# Patient Record
Sex: Male | Born: 1950
Health system: Southern US, Community
[De-identification: ages and names within clinical notes are randomized; demographics above are authoritative.]

## PROBLEM LIST (undated history)

## (undated) DIAGNOSIS — I10 Essential (primary) hypertension: Secondary | ICD-10-CM

## (undated) DIAGNOSIS — M1990 Unspecified osteoarthritis, unspecified site: Secondary | ICD-10-CM

## (undated) DIAGNOSIS — E119 Type 2 diabetes mellitus without complications: Secondary | ICD-10-CM

## (undated) DIAGNOSIS — N186 End stage renal disease: Secondary | ICD-10-CM

## (undated) HISTORY — PX: OTHER SURGICAL HISTORY: SHX169

---

## 2006-02-22 ENCOUNTER — Emergency Department (HOSPITAL_COMMUNITY): Admission: EM | Admit: 2006-02-22 | Discharge: 2006-02-22 | Payer: Self-pay | Admitting: Emergency Medicine

## 2007-02-06 ENCOUNTER — Observation Stay (HOSPITAL_COMMUNITY): Admission: EM | Admit: 2007-02-06 | Discharge: 2007-02-07 | Payer: Self-pay | Admitting: Family Medicine

## 2007-02-06 ENCOUNTER — Ambulatory Visit: Payer: Self-pay | Admitting: Thoracic Surgery (Cardiothoracic Vascular Surgery)

## 2007-02-15 ENCOUNTER — Ambulatory Visit: Payer: Self-pay | Admitting: Thoracic Surgery (Cardiothoracic Vascular Surgery)

## 2007-02-15 ENCOUNTER — Encounter
Admission: RE | Admit: 2007-02-15 | Discharge: 2007-02-15 | Payer: Self-pay | Admitting: Thoracic Surgery (Cardiothoracic Vascular Surgery)

## 2008-10-29 ENCOUNTER — Emergency Department (HOSPITAL_COMMUNITY): Admission: EM | Admit: 2008-10-29 | Discharge: 2008-10-29 | Payer: Self-pay | Admitting: Family Medicine

## 2010-06-11 NOTE — Assessment & Plan Note (Signed)
OFFICE VISIT   STERLYN, Matthew [name]  DOB:  1950-10-29                                        February 15, 2007  CHART #:  [id number]   HISTORY OF PRESENT ILLNESS:  [name] returns for a follow-up  related to atypical chest pain and possible large bleb in the left lung  originally discovered on 02/06/07 at the time he was evaluated in the  Emergency Department at [facility].  At that time the  ER physicians and radiologists were concerned that he had spontaneous  pneumothorax.  On careful examination of the chest x-ray obtained, there  was no clear evidence for pneumothorax and as such, Matthew [name] was  simply observed in the hospital overnight.  Repeat chest x-ray the  following morning with decubitus films reveal no sign of pneumothorax  but rather a very large bleb in the left lung field.  Matthew [name]  returns to the office today with another repeat x-ray and with plans for  follow-up.  He states that the atypical left sided chest pain that he  had resolved.  He also had been having some trouble tolerating oral  foods that day and he wonders whether or not he may have had some type  of virus.  All of these symptoms have resolved.  He reports no shortness  of breath.  He has no cough.  He has no further chest pain.   PHYSICAL EXAMINATION:  Notable for a well appearing male with blood  pressure 102/68, pulse 89, oxygen saturation 92% on room air.  Examination of chest reveals clear breath sounds which are symmetrical  bilaterally.  No wheezes or rhonchi are demonstrated.  Cardiovascular:  Notable for regular rate and rhythm.  Extremities:  Warm and well  perfused.  The remainder of his physical exam is unrevealing.   DIAGNOSTIC TESTS:  Chest x-ray obtained today is reviewed and compared  with his previous x-rays.  This demonstrates stable appearance of the  left lung field with large bleb at the left apex.  There is no sign of  pneumothorax.  There is no sign of pneumonia.  There is no significant  of any significant pulmonary parenchymal lesions.   IMPRESSION:  Probable large left sided intraparenchymal bleb with recent  transient episode of atypical chest pain that has resolved.   PLAN:  Matthew [name] will call and return to see [place] in the future should  he develop any further episodes of recurrent pain.  If so, a chest CT  scan might be warranted for further evaluation to make sure this bleb  does not get infected or has not become associated with other pulmonary  parenchymal lesions.  At present he seems to be doing fine.   Valentina Gu. Roxy Manns, M.D.  Electronically Signed   CHO/MEDQ  D:  02/15/2007  T:  02/15/2007  Job:  [id number]

## 2010-10-17 LAB — BASIC METABOLIC PANEL
Creatinine, Ser: 1.19
GFR calc Af Amer: >60
GFR calc non Af Amer: >60
Glucose, Bld: 94
Potassium: 4.2
Sodium: 137

## 2010-10-17 LAB — BASIC METABOLIC PANEL WITH GFR
BUN: 9
CO2: 28
Calcium: 9
Chloride: 102

## 2010-10-17 LAB — DIFFERENTIAL
Basophils Absolute: 0.1
Basophils Relative: 1
Eosinophils Absolute: 0.1
Eosinophils Relative: 2
Lymphocytes Relative: 29
Lymphs Abs: 1.6
Monocytes Absolute: 0.5
Monocytes Relative: 9
Neutro Abs: 3.2
Neutrophils Relative %: 58

## 2010-10-17 LAB — RAPID STREP SCREEN (MED CTR MEBANE ONLY): Streptococcus, Group A Screen (Direct): NEGATIVE

## 2010-10-17 LAB — CBC
HCT: 50.8
Hemoglobin: 16.9
MCHC: 33.4
MCV: 90.8
Platelets: 190
RBC: 5.59
RDW: 13.4
WBC: 5.5

## 2010-10-17 LAB — PROTIME-INR
INR: 0.8
Prothrombin Time: 11.4 — ABNORMAL LOW

## 2010-10-17 LAB — APTT: aPTT: 27

## 2017-03-12 ENCOUNTER — Ambulatory Visit: Payer: Self-pay | Admitting: Family Medicine

## 2017-03-20 ENCOUNTER — Other Ambulatory Visit: Payer: Self-pay

## 2017-03-20 ENCOUNTER — Ambulatory Visit (INDEPENDENT_AMBULATORY_CARE_PROVIDER_SITE_OTHER): Payer: Medicare Other | Admitting: Family Medicine

## 2017-03-20 ENCOUNTER — Encounter: Payer: Self-pay | Admitting: Family Medicine

## 2017-03-20 ENCOUNTER — Ambulatory Visit (INDEPENDENT_AMBULATORY_CARE_PROVIDER_SITE_OTHER): Payer: Medicare Other

## 2017-03-20 VITALS — BP 138/70 | HR 81 | Temp 97.3°F | Resp 17 | Ht 71.5 in | Wt 197.0 lb

## 2017-03-20 DIAGNOSIS — R350 Frequency of micturition: Secondary | ICD-10-CM

## 2017-03-20 DIAGNOSIS — Z23 Encounter for immunization: Secondary | ICD-10-CM

## 2017-03-20 DIAGNOSIS — R05 Cough: Secondary | ICD-10-CM

## 2017-03-20 DIAGNOSIS — R631 Polydipsia: Secondary | ICD-10-CM | POA: Diagnosis not present

## 2017-03-20 DIAGNOSIS — N400 Enlarged prostate without lower urinary tract symptoms: Secondary | ICD-10-CM

## 2017-03-20 DIAGNOSIS — N402 Nodular prostate without lower urinary tract symptoms: Secondary | ICD-10-CM

## 2017-03-20 DIAGNOSIS — R059 Cough, unspecified: Secondary | ICD-10-CM

## 2017-03-20 DIAGNOSIS — R0609 Other forms of dyspnea: Secondary | ICD-10-CM

## 2017-03-20 DIAGNOSIS — R0602 Shortness of breath: Secondary | ICD-10-CM | POA: Diagnosis not present

## 2017-03-20 LAB — POC MICROSCOPIC URINALYSIS (UMFC): Mucus: ABSENT

## 2017-03-20 LAB — POCT URINALYSIS DIP (MANUAL ENTRY)
Bilirubin, UA: NEGATIVE
Blood, UA: NEGATIVE
Glucose, UA: NEGATIVE mg/dL
Ketones, POC UA: NEGATIVE mg/dL
LEUKOCYTES UA: NEGATIVE
NITRITE UA: NEGATIVE
PH UA: 5.5 (ref 5.0–8.0)
PROTEIN UA: NEGATIVE mg/dL
Spec Grav, UA: <=1.005 — AB (ref 1.010–1.025)
UROBILINOGEN UA: 0.2 U/dL

## 2017-03-20 LAB — GLUCOSE, POCT (MANUAL RESULT ENTRY): POC GLUCOSE: 81 mg/dL (ref 70–99)

## 2017-03-20 NOTE — Patient Instructions (Addendum)
Blood sugar was normal today. I will check for prostate infection with blood tests, but I do think there may be some enlargement or possible nodular areas of your prostate. I will refer you to urology for further evaluation. I'm also checking other electrolytes for your frequent urination. Please follow-up in 1 week to discuss these results  I will follow the x-ray results and let you know if there are any concerns. We can also discuss the shortness of breath with exertion further at next visit. Please follow-up in 1 week.  Return to the clinic or go to the nearest emergency room if any of your symptoms worsen or new symptoms occur.     IF you received an x-ray today, you will receive an invoice from [facility]. Please contact [facility] at [phone] with questions or concerns regarding your invoice.   IF you received labwork today, you will receive an invoice from [place] [facility]. Please contact [facility] at [phone] with questions or concerns regarding your invoice.   Our billing staff will not be able to assist you with questions regarding bills from these companies.  You will be contacted with the lab results as soon as they are available. The fastest way to get your results is to activate your My Chart account. Instructions are located on the last page of this paperwork. If you have not heard from [place] regarding the results in 2 weeks, please contact this office.

## 2017-03-20 NOTE — Progress Notes (Signed)
Subjective:  By signing my name below, I, Essence Howell, attest that this documentation has been prepared under the direction and in the presence of Wendie Agreste, MD Electronically Signed: Ladene Artist, ED Scribe 03/20/2017 at 9:37 AM.   Patient ID: Matthew [name], male    DOB: [birth year], 67 y.o.   MRN: [id number]   Chief Complaint  Patient presents with  . New Patient (Initial Visit)    establish care.  Pt has concerns about urinary frequency x 6 months. ? prostate Problem and sob after exercise.   HPI Matthew [name] is a 67 y.o. male who presents to Primary Care at [facility] to establish care. New pt to me. Complaining of urinary frequency and dyspnea.  Urinary Frequency  Pt reports urinary frequency and nocturia x 6 months. Reports he urinates approximately every 15 mins. Also reports polydipsia and has increased his water intake. Denies dysuria, nausea, vomiting, abdominal pain, fever, blurry vision. Pt's daughter with DM and several other family members. No h/o prostate issues.  Dyspnea Pt reports intermittent sob after exercising, specially walking up stairs/jogging/riding his bike. Also reports some occasional dry cough x 1 yr. Denies sob today, cp, chest tightness, unexplained weight loss, night sweats. Pt is a nonsmoker.  There are no active problems to display for this patient.    Not on File Prior to Admission medications   Not on File   Social History   Socioeconomic History  . Marital status: Single    Spouse name: Not on file  . Number of children: Not on file  . Years of education: Not on file  . Highest education level: Not on file  Social Needs  . Financial resource strain: Not on file  . Food insecurity - worry: Not on file  . Food insecurity - inability: Not on file  . Transportation needs - medical: Not on file  . Transportation needs - non-medical: Not on file  Occupational History  . Not on file  Tobacco Use  . Smoking status: Never Smoker    . Smokeless tobacco: Never Used  Substance and Sexual Activity  . Alcohol use: Yes    Alcohol/week: 6.0 - 7.2 oz    Types: 10 - 12 Cans of beer per week  . Drug use: No  . Sexual activity: Yes  Other Topics Concern  . Not on file  Social History Narrative  . Not on file   Review of Systems  Constitutional: Negative for diaphoresis, fever and unexpected weight change.  Eyes: Negative for visual disturbance.  Respiratory: Positive for cough and shortness of breath. Negative for chest tightness.   Cardiovascular: Negative for chest pain.  Gastrointestinal: Negative for abdominal pain, nausea and vomiting.  Genitourinary: Positive for frequency. Negative for dysuria.      Objective:   Physical Exam  Constitutional: He is oriented to person, place, and time. He appears well-developed and well-nourished.  HENT:  Head: Normocephalic and atraumatic.  Eyes: EOM are normal. Pupils are equal, round, and reactive to light.  Neck: No JVD present. Carotid bruit is not present.  Cardiovascular: Normal rate, regular rhythm and normal heart sounds.  No murmur heard. Pulmonary/Chest: Effort normal and breath sounds normal. He has no rales.  Abdominal: Soft. There is no tenderness.  Genitourinary: Prostate is enlarged.  Genitourinary Comments: Prostate was enlarged. Asymmetric with possible nodular/firm area 5-6 o'clock.  Musculoskeletal: He exhibits no edema.  Neurological: He is alert and oriented to person, place, and time.  Skin: Skin is warm  and dry.  Psychiatric: He has a normal mood and affect.  Vitals reviewed.  Vitals:   03/20/17 0850  BP: 138/70  Pulse: 81  Resp: 17  Temp: (!) 97.3 F (36.3 C)  TempSrc: Oral  Weight: 197 lb (89.4 kg)  Height: 5' 11.5" (1.816 m)   Results for orders placed or performed in visit on 03/20/17  POCT urinalysis dipstick  Result Value Ref Range   Color, UA yellow yellow   Clarity, UA clear clear   Glucose, UA negative negative mg/dL    Bilirubin, UA negative negative   Ketones, POC UA negative negative mg/dL   Spec Grav, UA <=1.005 (A) 1.010 - 1.025   Blood, UA negative negative   pH, UA 5.5 5.0 - 8.0   Protein Ur, POC negative negative mg/dL   Urobilinogen, UA 0.2 0.2 or 1.0 E.U./dL   Nitrite, UA Negative Negative   Leukocytes, UA Negative Negative  POCT glucose (manual entry)  Result Value Ref Range   POC Glucose 81 70 - 99 mg/dl  POCT Microscopic Urinalysis ([facility])  Result Value Ref Range   WBC,UR,HPF,POC None None WBC/hpf   RBC,UR,HPF,POC None None RBC/hpf   Bacteria None None, Too numerous to count   Mucus Absent Absent   Epithelial Cells, UR Per Microscopy None None, Too numerous to count cells/hpf  EKG readings done by Wendie Agreste, MD: sinus rhythm. Flat T waves inferolateral. No acute findings.  Dg Chest 2 View  Result Date: 03/20/2017 CLINICAL DATA:  Cough.  Shortness of breath. EXAM: CHEST  2 VIEW COMPARISON:  10/29/2008. FINDINGS: Mediastinum hilar structures normal. Prominent bullous changes, particularly left upper lung consistent with COPD. Similar findings noted on prior exam. Stable cardiomegaly. No pulmonary venous congestion. No acute bony abnormality IMPRESSION: Bullous COPD. Bullous changes particular severe about the left upper lung. No acute cardiopulmonary disease. Chest is stable from 10/29/2008. Electronically Signed   By: Marcello Moores  Register   On: 03/20/2017 10:30      Assessment & Plan:  Matthew [name] is a 67 y.o. male Urinary frequency - Plan: POCT urinalysis dipstick, PSA, POCT glucose (manual entry), POCT Microscopic Urinalysis ([facility]), Comprehensive metabolic panel, Ambulatory referral to Urology Increased thirst - Plan: POCT glucose (manual entry) Enlarged prostate - Plan: Ambulatory referral to Urology Nodular prostate - Plan: Ambulatory referral to Urology  -Increase thirst with urinary frequency, initial thought of possible hyperglycemia, but reassuring testing in office.  Prostate asymmetric with possible nodular area mid to right lobe.  -Check PSA, referral to urology for further evaluation  -Check CMP given dilute urine and above symptoms, differential includes diabetes insipidus  Need for prophylactic vaccination and inoculation against influenza - Plan: Flu Vaccine QUAD 36+ mos IM  Dyspnea on exertion - Plan: CBC, DG Chest 2 View, EKG 12-Lead, Comprehensive metabolic panel Cough - Plan: DG Chest 2 View, EKG 12-Lead  -Reassuring chest x-ray, possible COPD component. EKG reassuring, doubt cardiac source at this point.. May be related to above urinary frequency and increase thirst. CMP pending. Will discuss further workup at follow-up in one week.   No orders of the defined types were placed in this encounter.  Patient Instructions   Blood sugar was normal today. I will check for prostate infection with blood tests, but I do think there may be some enlargement or possible nodular areas of your prostate. I will refer you to urology for further evaluation. I'm also checking other electrolytes for your frequent urination. Please follow-up in 1 week to discuss these  results  I will follow the x-ray results and let you know if there are any concerns. We can also discuss the shortness of breath with exertion further at next visit. Please follow-up in 1 week.  Return to the clinic or go to the nearest emergency room if any of your symptoms worsen or new symptoms occur.     IF you received an x-ray today, you will receive an invoice from [facility]. Please contact [facility] at [phone] with questions or concerns regarding your invoice.   IF you received labwork today, you will receive an invoice from [place]. Please contact [facility] at [phone] with questions or concerns regarding your invoice.   Our billing staff will not be able to assist you with questions regarding bills from these companies.  You will be contacted with the lab  results as soon as they are available. The fastest way to get your results is to activate your My Chart account. Instructions are located on the last page of this paperwork. If you have not heard from [place] regarding the results in 2 weeks, please contact this office.      I personally performed the services described in this documentation, which was scribed in my presence. The recorded information has been reviewed and considered for accuracy and completeness, addended by me as needed, and agree with information above.  Signed,   Merri Ray, MD Primary Care at [place].  03/21/17 2:51 PM

## 2017-03-21 ENCOUNTER — Encounter: Payer: Self-pay | Admitting: Family Medicine

## 2017-03-21 LAB — COMPREHENSIVE METABOLIC PANEL
ALK PHOS: 74 IU/L (ref 39–117)
ALT: 8 IU/L (ref 0–44)
AST: 14 IU/L (ref 0–40)
Albumin/Globulin Ratio: 1.1 — ABNORMAL LOW (ref 1.2–2.2)
Albumin: 4.3 g/dL (ref 3.6–4.8)
BUN/Creatinine Ratio: 15 (ref 10–24)
BUN: 44 mg/dL — ABNORMAL HIGH (ref 8–27)
Bilirubin Total: 0.3 mg/dL (ref 0.0–1.2)
CALCIUM: 9 mg/dL (ref 8.6–10.2)
CO2: 19 mmol/L — AB (ref 20–29)
CREATININE: 3 mg/dL — AB (ref 0.76–1.27)
Chloride: 105 mmol/L (ref 96–106)
GFR calc Af Amer: 24 mL/min/{1.73_m2} — ABNORMAL LOW (ref >59)
GFR, EST NON AFRICAN AMERICAN: 21 mL/min/{1.73_m2} — AB (ref >59)
GLUCOSE: 97 mg/dL (ref 65–99)
Globulin, Total: 3.8 g/dL (ref 1.5–4.5)
Potassium: 4.7 mmol/L (ref 3.5–5.2)
Sodium: 138 mmol/L (ref 134–144)
Total Protein: 8.1 g/dL (ref 6.0–8.5)

## 2017-03-21 LAB — CBC
HEMOGLOBIN: 11.9 g/dL — AB (ref 13.0–17.7)
Hematocrit: 35.7 % — ABNORMAL LOW (ref 37.5–51.0)
MCH: 29.8 pg (ref 26.6–33.0)
MCHC: 33.3 g/dL (ref 31.5–35.7)
MCV: 89 fL (ref 79–97)
PLATELETS: 250 10*3/uL (ref 150–379)
RBC: 4 x10E6/uL — ABNORMAL LOW (ref 4.14–5.80)
RDW: 13.5 % (ref 12.3–15.4)
WBC: 4.9 10*3/uL (ref 3.4–10.8)

## 2017-03-21 LAB — PSA: PROSTATE SPECIFIC AG, SERUM: 7.9 ng/mL — AB (ref 0.0–4.0)

## 2017-03-27 NOTE — Progress Notes (Signed)
Letter sent.

## 2017-03-30 ENCOUNTER — Ambulatory Visit (INDEPENDENT_AMBULATORY_CARE_PROVIDER_SITE_OTHER): Payer: Medicare Other | Admitting: Family Medicine

## 2017-03-30 ENCOUNTER — Encounter: Payer: Self-pay | Admitting: Family Medicine

## 2017-03-30 ENCOUNTER — Other Ambulatory Visit: Payer: Self-pay

## 2017-03-30 VITALS — BP 160/80 | HR 72 | Temp 98.7°F | Resp 16 | Ht 70.0 in | Wt 195.6 lb

## 2017-03-30 DIAGNOSIS — R05 Cough: Secondary | ICD-10-CM

## 2017-03-30 DIAGNOSIS — N400 Enlarged prostate without lower urinary tract symptoms: Secondary | ICD-10-CM

## 2017-03-30 DIAGNOSIS — R9389 Abnormal findings on diagnostic imaging of other specified body structures: Secondary | ICD-10-CM | POA: Diagnosis not present

## 2017-03-30 DIAGNOSIS — R7989 Other specified abnormal findings of blood chemistry: Secondary | ICD-10-CM

## 2017-03-30 DIAGNOSIS — R972 Elevated prostate specific antigen [PSA]: Secondary | ICD-10-CM

## 2017-03-30 DIAGNOSIS — R350 Frequency of micturition: Secondary | ICD-10-CM

## 2017-03-30 DIAGNOSIS — R0600 Dyspnea, unspecified: Secondary | ICD-10-CM

## 2017-03-30 DIAGNOSIS — R059 Cough, unspecified: Secondary | ICD-10-CM

## 2017-03-30 LAB — POCT URINALYSIS DIP (MANUAL ENTRY)
BILIRUBIN UA: NEGATIVE
GLUCOSE UA: NEGATIVE mg/dL
Ketones, POC UA: NEGATIVE mg/dL
LEUKOCYTES UA: NEGATIVE
Nitrite, UA: NEGATIVE
Protein Ur, POC: NEGATIVE mg/dL
RBC UA: NEGATIVE
Spec Grav, UA: <=1.005 — AB (ref 1.010–1.025)
Urobilinogen, UA: 0.2 E.U./dL
pH, UA: 6 (ref 5.0–8.0)

## 2017-03-30 LAB — BASIC METABOLIC PANEL
BUN / CREAT RATIO: 14 (ref 10–24)
BUN: 47 mg/dL — AB (ref 8–27)
CO2: 22 mmol/L (ref 20–29)
Calcium: 9.1 mg/dL (ref 8.6–10.2)
Chloride: 106 mmol/L (ref 96–106)
Creatinine, Ser: 3.32 mg/dL (ref 0.76–1.27)
GFR calc non Af Amer: 18 mL/min/{1.73_m2} — ABNORMAL LOW (ref >59)
GFR, EST AFRICAN AMERICAN: 21 mL/min/{1.73_m2} — AB (ref >59)
GLUCOSE: 85 mg/dL (ref 65–99)
POTASSIUM: 5.2 mmol/L (ref 3.5–5.2)
SODIUM: 138 mmol/L (ref 134–144)

## 2017-03-30 LAB — POC MICROSCOPIC URINALYSIS (UMFC): MUCUS RE: ABSENT

## 2017-03-30 MED ORDER — TIOTROPIUM BROMIDE MONOHYDRATE 18 MCG IN CAPS: 18.0000 ug | ORAL_CAPSULE | Freq: Every day | RESPIRATORY_TRACT | 2 refills | Status: DC

## 2017-03-30 MED ORDER — TAMSULOSIN HCL 0.4 MG PO CAPS: 0.4000 mg | ORAL_CAPSULE | Freq: Every day | ORAL | 0 refills | Status: DC

## 2017-03-30 NOTE — Progress Notes (Signed)
Subjective:    Patient ID: Matthew [name], male    DOB: 1950/08/20, 67 y.o.   MRN: [id number]  HPI Matthew [name] is a 67 y.o. male Presents today for: Chief Complaint  Patient presents with  . Urinary frequency    1 week f/u, "have calmed down just a little, haven't been drinking as much trying to keep from urinating as much"  . Prostate nodular   . Follow-up from initial visit 03/20/2017. He was a new patient  establishing care at that visit, but other concerns at that time.   Urinary frequency with elevated creatinine, PSA  -Reported symptoms for 6 months at last visit. Also endorsed polydipsia and increased water intake. Urinalysis last visit overall reassuring except for dilute specimen, severe gravity at less than 1.005. Normal in office glucose of 81. Sodium, potassium were normal and CMP. PSA was elevated at 7.9, elevated creatinine at 3.00. With EGFR 24. No recent creatinine for comparison but 10 years ago was normal at 1.19.  Borderline anemia with hemoglobin 11.9. He was referred to urology at that visit routinely given asymmetric prostate with possible nodular prostate on the right.   Has not yet received call form urologist. Denies nausea, vomiting. Less urinary frequency, but has cut back on fluid intake. Does have some urinary leakage, worse with increased water intake. Feels less urinary pressure, not urinating as much. Does admit to some times of hesitancy/starting urine stream. Denies difficulty initiating urine today. No fever. No dysuria. No back pain. Feels like stomach still feels full after urinating.   Dyspnea on exertion. See last office visit, reported intermittent dyspnea after exercising with occasional dry cough for one year, asymptomatic at last visit. Lungs were clear, vitals were overall reassuring at that time. Chest x-ray indicated bullous COPD, EKG was reassuring. Only smoked 1 month, but lived in house with smokers for years. Still with some light/dry  cough. Some shortness of breath with exertion.   There are no active problems to display for this patient.  History reviewed. No pertinent past medical history. Past Surgical History:  Procedure Laterality Date  . finger lanced     Not on File Prior to Admission medications   Medication Sig Start Date End Date Taking? Authorizing Provider  acetaminophen (TYLENOL) 500 MG tablet Take 500 mg by mouth every 6 (six) hours as needed.    [provider]   Social History   Socioeconomic History  . Marital status: Single    Spouse name: Not on file  . Number of children: Not on file  . Years of education: Not on file  . Highest education level: Not on file  Social Needs  . Financial resource strain: Not on file  . Food insecurity - worry: Not on file  . Food insecurity - inability: Not on file  . Transportation needs - medical: Not on file  . Transportation needs - non-medical: Not on file  Occupational History  . Not on file  Tobacco Use  . Smoking status: Never Smoker  . Smokeless tobacco: Never Used  Substance and Sexual Activity  . Alcohol use: Yes    Alcohol/week: 6.0 - 7.2 oz    Types: 10 - 12 Cans of beer per week  . Drug use: No  . Sexual activity: Yes  Other Topics Concern  . Not on file  Social History Narrative  . Not on file    Review of Systems  Constitutional: Negative for chills and fever.  Gastrointestinal: Positive for abdominal  distention. Negative for abdominal pain (not currently, but does feel like abdomen is swollen. ).       Objective:   Physical Exam  Constitutional: He is oriented to person, place, and time. He appears well-developed and well-nourished.  HENT:  Head: Normocephalic and atraumatic.  Eyes: EOM are normal. Pupils are equal, round, and reactive to light.  Neck: No JVD present. Carotid bruit is not present.  Cardiovascular: Normal rate, regular rhythm and normal heart sounds.  No murmur heard. Pulmonary/Chest: Effort  normal and breath sounds normal. He has no rales.  Abdominal:  Firm, appears distended lower abdomen.   Musculoskeletal: He exhibits no edema.  Neurological: He is alert and oriented to person, place, and time.  Skin: Skin is warm and dry.  Psychiatric: He has a normal mood and affect.  Vitals reviewed.  Vitals:   03/30/17 1351  BP: (!) 160/80  Pulse: 72  Resp: 16  Temp: 98.7 F (37.1 C)  TempSrc: Oral  SpO2: 99%  Weight: 195 lb 9.6 oz (88.7 kg)  Height: 5' 10" (1.778 m)    Results for orders placed or performed in visit on 03/30/17  POCT urinalysis dipstick  Result Value Ref Range   Color, UA yellow yellow   Clarity, UA clear clear   Glucose, UA negative negative mg/dL   Bilirubin, UA negative negative   Ketones, POC UA negative negative mg/dL   Spec Grav, UA <=1.005 (A) 1.010 - 1.025   Blood, UA negative negative   pH, UA 6.0 5.0 - 8.0   Protein Ur, POC negative negative mg/dL   Urobilinogen, UA 0.2 0.2 or 1.0 E.U./dL   Nitrite, UA Negative Negative   Leukocytes, UA Negative Negative  POCT Microscopic Urinalysis ([facility])  Result Value Ref Range   WBC,UR,HPF,POC None None WBC/hpf   RBC,UR,HPF,POC None None RBC/hpf   Bacteria None None, Too numerous to count   Mucus Absent Absent   Epithelial Cells, UR Per Microscopy None None, Too numerous to count cells/hpf      Assessment & Plan:   Matthew [name] is a 67 y.o. male Urinary frequency - Plan: POCT urinalysis dipstick, POCT Microscopic Urinalysis ([facility]), Urine Culture, tamsulosin (FLOMAX) 0.4 MG CAPS capsule Elevated serum creatinine - Plan: Basic metabolic panel Enlarged prostate - Plan: tamsulosin (FLOMAX) 0.4 MG CAPS capsule Elevated PSA - Plan: tamsulosin (FLOMAX) 0.4 MG CAPS capsule  - Elevated PSA with creatinine elevation as above. BPH vs prostate CA with suspected nodular component on last exam. Reports some improved urination symptoms but still with frequency, incontinence. Firm area lower abdomen  suspicious for bladder distention/obstruction but minimal discomfort.  -Discussed with urology, plan for follow-up following day, stat creatinine obtained. Tamsulosin 0.27m QD started.    - Creatinine elevated at 3.32, up from last time at 3.0. Suspected obstructive uropathy. Attempted to call patient multiple times from office, reaching voicemail each time. Left voicemail at 7:15 PM advising him to call our number/answering service.  Would recommend ER visit tonight for attempt at catheterization given elevation in creatinine.   Dyspnea, unspecified type - Plan: tiotropium (SPIRIVA HANDIHALER) 18 MCG inhalation capsule, Ambulatory referral to Pulmonology Cough - Plan: tiotropium (SPIRIVA HANDIHALER) 18 MCG inhalation capsule, Ambulatory referral to Pulmonology Abnormal x-ray - Plan: tiotropium (SPIRIVA HANDIHALER) 18 MCG inhalation capsule, Ambulatory referral to Pulmonology  - Episodic cough, dyspnea, along with x-ray findings and prior secondhand smoke exposure suspicious for COPD. Refer to pulmonary, start Spiriva. RTC/ER precautions if acute worsening  Meds ordered this encounter  Medications  . tiotropium (SPIRIVA HANDIHALER) 18 MCG inhalation capsule    Sig: Place 1 capsule (18 mcg total) into inhaler and inhale daily.    Dispense:  30 capsule    Refill:  2  . tamsulosin (FLOMAX) 0.4 MG CAPS capsule    Sig: Take 1 capsule (0.4 mg total) by mouth daily.    Dispense:  30 capsule    Refill:  0   Patient Instructions   I will refer you to a lung specialist as your symptoms and Xray indicate likely COPD. Start Spiriva once per day. Return to the clinic or go to the nearest emergency room if any of your symptoms worsen or new symptoms occur.  I am concerned that some of your urinary symptoms may be due to the enlarged prostate. Start tamsulosin once per day. I will recheck your kidney function today, and if that is worsening will likely call you to be seen in the emergency room. You may need  to have a catheter placed. Either way I did discuss your symptoms with urology and they will be calling you for an appointment tomorrow. If any worsening symptoms tonight to the emergency room.    IF you received an x-ray today, you will receive an invoice from [facility]. Please contact [facility] at [phone] with questions or concerns regarding your invoice.   IF you received labwork today, you will receive an invoice from [place]. Please contact [facility] at [phone] with questions or concerns regarding your invoice.   Our billing staff will not be able to assist you with questions regarding bills from these companies.  You will be contacted with the lab results as soon as they are available. The fastest way to get your results is to activate your My Chart account. Instructions are located on the last page of this paperwork. If you have not heard from [place] regarding the results in 2 weeks, please contact this office.     Signed,   Merri Ray, MD Primary Care at [place].  03/30/17 7:36 PM

## 2017-03-30 NOTE — Patient Instructions (Addendum)
I will refer you to a lung specialist as your symptoms and Xray indicate likely COPD. Start Spiriva once per day. Return to the clinic or go to the nearest emergency room if any of your symptoms worsen or new symptoms occur.  I am concerned that some of your urinary symptoms may be due to the enlarged prostate. Start tamsulosin once per day. I will recheck your kidney function today, and if that is worsening will likely call you to be seen in the emergency room. You may need to have a catheter placed. Either way I did discuss your symptoms with urology and they will be calling you for an appointment tomorrow. If any worsening symptoms tonight to the emergency room.    IF you received an x-ray today, you will receive an invoice from [facility]. Please contact [facility] at [phone] with questions or concerns regarding your invoice.   IF you received labwork today, you will receive an invoice from [place]. Please contact [facility] at [phone] with questions or concerns regarding your invoice.   Our billing staff will not be able to assist you with questions regarding bills from these companies.  You will be contacted with the lab results as soon as they are available. The fastest way to get your results is to activate your My Chart account. Instructions are located on the last page of this paperwork. If you have not heard from [place] regarding the results in 2 weeks, please contact this office.

## 2017-03-31 LAB — URINE CULTURE: ORGANISM ID, BACTERIA: NO GROWTH

## 2017-04-03 ENCOUNTER — Telehealth: Payer: Self-pay | Admitting: Family Medicine

## 2017-04-03 NOTE — Telephone Encounter (Signed)
Called patient to check status from last visit and lab work as I had not yet heard from him. Plan was for him to see urology the following day, but did have elevated creatinine, suspected obstructive uropathy. Left message on his voicemail again to call us as soon as he gets this message so we can check status and arrange for repeat creatinine/testing.

## 2017-05-09 ENCOUNTER — Encounter: Payer: Self-pay | Admitting: Radiology

## 2017-06-30 ENCOUNTER — Institutional Professional Consult (permissible substitution): Payer: Self-pay | Admitting: Internal Medicine

## 2017-07-31 ENCOUNTER — Other Ambulatory Visit (INDEPENDENT_AMBULATORY_CARE_PROVIDER_SITE_OTHER): Payer: Medicare Other

## 2017-07-31 ENCOUNTER — Ambulatory Visit (INDEPENDENT_AMBULATORY_CARE_PROVIDER_SITE_OTHER): Payer: Medicare Other | Admitting: Internal Medicine

## 2017-07-31 ENCOUNTER — Encounter: Payer: Self-pay | Admitting: Internal Medicine

## 2017-07-31 VITALS — BP 150/80 | HR 75 | Ht 70.0 in | Wt 182.0 lb

## 2017-07-31 DIAGNOSIS — R0609 Other forms of dyspnea: Secondary | ICD-10-CM

## 2017-07-31 DIAGNOSIS — N184 Chronic kidney disease, stage 4 (severe): Secondary | ICD-10-CM | POA: Diagnosis not present

## 2017-07-31 DIAGNOSIS — D631 Anemia in chronic kidney disease: Secondary | ICD-10-CM

## 2017-07-31 LAB — CBC WITH DIFFERENTIAL/PLATELET
BASOS ABS: 0.1 10*3/uL (ref 0.0–0.1)
BASOS PCT: 1.2 % (ref 0.0–3.0)
EOS PCT: 1.4 % (ref 0.0–5.0)
Eosinophils Absolute: 0.1 10*3/uL (ref 0.0–0.7)
HCT: 29.6 % — ABNORMAL LOW (ref 39.0–52.0)
Hemoglobin: 9.8 g/dL — ABNORMAL LOW (ref 13.0–17.0)
LYMPHS ABS: 1.3 10*3/uL (ref 0.7–4.0)
Lymphocytes Relative: 25.1 % (ref 12.0–46.0)
MCHC: 33.2 g/dL (ref 30.0–36.0)
MCV: 93.2 fl (ref 78.0–100.0)
MONOS PCT: 6.6 % (ref 3.0–12.0)
Monocytes Absolute: 0.3 10*3/uL (ref 0.1–1.0)
NEUTROS ABS: 3.5 10*3/uL (ref 1.4–7.7)
NEUTROS PCT: 65.7 % (ref 43.0–77.0)
PLATELETS: 218 10*3/uL (ref 150.0–400.0)
RBC: 3.17 Mil/uL — ABNORMAL LOW (ref 4.22–5.81)
RDW: 12.9 % (ref 11.5–15.5)
WBC: 5.3 10*3/uL (ref 4.0–10.5)

## 2017-07-31 LAB — BASIC METABOLIC PANEL
BUN: 40 mg/dL — ABNORMAL HIGH (ref 6–23)
CALCIUM: 8.2 mg/dL — AB (ref 8.4–10.5)
CHLORIDE: 106 meq/L (ref 96–112)
CO2: 20 meq/L (ref 19–32)
Creatinine, Ser: 3.29 mg/dL — ABNORMAL HIGH (ref 0.40–1.50)
GFR: 24.29 mL/min — ABNORMAL LOW (ref >60.00)
GLUCOSE: 81 mg/dL (ref 70–99)
Potassium: 4.4 mEq/L (ref 3.5–5.1)
SODIUM: 134 meq/L — AB (ref 135–145)

## 2017-07-31 LAB — BRAIN NATRIURETIC PEPTIDE: PRO B NATRI PEPTIDE: 93 pg/mL (ref 0.0–100.0)

## 2017-07-31 LAB — TSH: TSH: 1.88 u[IU]/mL (ref 0.35–4.50)

## 2017-07-31 LAB — SEDIMENTATION RATE: Sed Rate: 70 mm/hr — ABNORMAL HIGH (ref 0–20)

## 2017-07-31 NOTE — Progress Notes (Signed)
Matthew [name], male    DOB: Aug 21, 1950, 67 y.o.   MRN: [id number]   Brief patient profile:  67 yobm quit smoking shortly p he started in  1999  p reports noted  dyspnea and some cough which resolved    s need for meds but noted onset of mild doe with heavy exertion since 2017 so referred to pulmonary clinic 07/31/2017 by Dr Shea Stakes    07/31/2017  Initial pulmonary eval/ [facility]  Chief Complaint  Patient presents with  . Pulmonary Consult    Referred by Dr. Carlota Raspberry.  Pt c/o SOB off and on x 2 yrs. He states this just happens from time to time with no specific trigger.   Dyspnea:  MMRC1 = can walk nl pace, flat grade, can't hurry or go uphills or steps s sob   Rides bike to work has been more difficult up hills esp in cold weather / assoc sometimes dry cough   SABA use: none - was prescribed spiriva but didn't fill it  No obvious day to day or daytime variability or assoc excess/ purulent sputum or mucus plugs or hemoptysis or cp or chest tightness, subjective wheeze or overt sinus or hb symptoms.   Sleeps flat  without nocturnal  or early am exacerbation  of respiratory  c/o's or need for noct saba. Also denies any obvious fluctuation of symptoms with weather or environmental changes or other aggravating or alleviating factors except as outlined above   No unusual exposure hx or h/o childhood pna/ asthma or knowledge of premature birth.  Current Allergies, Complete Past Medical History, Past Surgical History, Family History, and Social History were reviewed in [employer] record.  ROS  The following are not active complaints unless bolded Hoarseness, sore throat, dysphagia, dental problems, itching, sneezing,  nasal congestion or discharge of excess mucus or purulent secretions, ear ache,   fever, chills, sweats, unintended wt loss or wt gain, classically pleuritic or exertional cp,  orthopnea pnd or arm/hand swelling  or leg swelling, presyncope, palpitations,  abdominal pain, anorexia, nausea, vomiting, diarrhea  or change in bowel habits or change in bladder habits- was having hesitarncy, decreased fos, denies now, change in stools or change in urine, dysuria, hematuria,  rash, arthralgias, visual complaints, headache, numbness, weakness or ataxia or problems with walking or coordination,  change in mood or  memory.           Outpatient Medications Prior to Visit  Medication Sig Dispense Refill  . acetaminophen (TYLENOL) 500 MG tablet Take 500 mg by mouth every 6 (six) hours as needed.    . tamsulosin (FLOMAX) 0.4 MG CAPS capsule Take 1 capsule (0.4 mg total) by mouth daily. 30 capsule 0  .    30 capsule 2           Objective:     BP (!) 150/80 (BP Location: Left Arm, Cuff Size: Normal)   Pulse 75   Ht 5\' 10"  (1.778 m)   Wt 182 lb (82.6 kg)   SpO2 99%   BMI 26.11 kg/m   SpO2: 99 %  RA  Wt Readings from Last 3 Encounters:  07/31/17 182 lb (82.6 kg)  03/30/17 195 lb 9.6 oz (88.7 kg)  03/20/17 197 lb (89.4 kg)     HEENT: nl dentition, turbinates bilaterally, and oropharynx. Nl external ear canals without cough reflex   NECK :  without JVD/Nodes/TM/ nl carotid upstrokes bilaterally   LUNGS: no acc muscle use,  Nl contour chest which is clear to A and P bilaterally without cough on insp or exp maneuvers   CV:  RRR  no s3 or murmur or increase in P2, and trace pitting L > R ankle  ABD:  soft and nontender with nl inspiratory excursion in the supine position. No bruits or organomegaly appreciated, bowel sounds nl  MS:  ? Walks with slt limp /shuffle   ext warm without deformities, calf tenderness, cyanosis or clubbing No obvious joint restrictions   SKIN: warm and dry without lesions    NEURO:  alert, approp, nl sensorium with  no motor or cerebellar deficits apparent.     I personally reviewed images and agree with radiology impression as follows:  CXR:   03/20/17  Bullous COPD. Bullous changes particular severe about  the left upper lung. No acute cardiopulmonary disease. Chest is stable from 10/29/2008.   Labs ordered/ reviewed:      Chemistry      Component Value Date/Time   NA 134 (L) 07/31/2017 1540   NA 138 03/30/2017 1532   K 4.4 07/31/2017 1540   CL 106 07/31/2017 1540   CO2 20 07/31/2017 1540   BUN 40 (H) 07/31/2017 1540   BUN 47 (H) 03/30/2017 1532   CREATININE 3.29 (H) 07/31/2017 1540      Component Value Date/Time   CALCIUM 8.2 (L) 07/31/2017 1540   ALKPHOS 74 03/20/2017 1123   AST 14 03/20/2017 1123   ALT 8 03/20/2017 1123   BILITOT 0.3 03/20/2017 1123        Lab Results  Component Value Date   WBC 5.3 07/31/2017   HGB 9.8 (L) 07/31/2017   HCT 29.6 (L) 07/31/2017   MCV 93.2 07/31/2017   PLT 218.0 07/31/2017     Lab Results  Component Value Date   DDIMER 0.30 07/31/2017      Lab Results  Component Value Date   TSH 1.88 07/31/2017     Lab Results  Component Value Date   PROBNP 93.0 07/31/2017       Lab Results  Component Value Date   ESRSEDRATE 70 (H) 07/31/2017             Assessment   DOE (dyspnea on exertion) 07/31/2017  Walked RA x 3 laps @ 185 ft each stopped due to  End of study, fast pace, no   desat   - min sob  - Spirometry 07/31/2017  FEV1 2.08 (70%)  Ratio 74    Despite cxr dx of emphysema he does not meet the criteria for copd and I don't think he actually smoked the 10 pk year threshold but his doe is easily explained by his anemia and renal insuff the latter of which has reduced his buffering capacity significantly though not in the range usually needed to start oracit replacement therapy.  Will cancel further pulmonary f/u at this point and obtain renal u/s  / refer to renal    Total time devoted to counseling  > 50 % of initial 60 min office visit:  review case with pt/daughter  discussion of options/alternatives/ personally creating written customized instructions  in presence of pt  then going over those specific  Instructions  directly with the pt including how to use all of the meds but in particular covering each new medication in detail and the difference between the maintenance= "automatic" meds and the prns using an action plan format for the latter (If this problem/symptom => do that organization reading Left  to right).  Please see AVS from this visit for a full list of these instructions which I personally wrote for this pt and  are unique to this visit.   Chronic renal failure, stage 4 (severe) (HCC) Estimated Creatinine Clearance: 22.8 mL/min (A) (by C-G formula based on SCr of 3.29 mg/dL (H)).   Lab Results  Component Value Date   CREATININE 3.29 (H) 07/31/2017   CREATININE 3.32 (HH) 03/30/2017   CREATININE 3.00 (H) 03/20/2017    He does report h/o urinary hesitancy so will arrange for renal u/s awaiting formal renal eval   Note HC03 20 likely related to Cr clearance < 30 and contributing to his doe but not severe enough to add HC03 at this point.   Anemia of chronic kidney failure   Lab Results  Component Value Date   HGB 9.8 (L) 07/31/2017   HGB 11.9 (L) 03/20/2017   HGB 16.9 02/06/2007     Normocytic and proprotional to creat clearance > no w/u for now but likely contributing to doe at higher levels of workload(bicycling up hill in this case as reported by pt)      Christinia Gully, MD 07/31/2017

## 2017-07-31 NOTE — Patient Instructions (Addendum)
No change in medications for now   Please remember to go to the lab and x-ray department downstairs in the basement  for your tests - we will call you with the results when they are available.     Please schedule a follow up office visit in 4 weeks, sooner if needed with full pfts  - late add : canceled pfts/ obtain renal u/s and refer to renal

## 2017-08-01 ENCOUNTER — Encounter: Payer: Self-pay | Admitting: Internal Medicine

## 2017-08-01 DIAGNOSIS — D631 Anemia in chronic kidney disease: Secondary | ICD-10-CM | POA: Insufficient documentation

## 2017-08-01 DIAGNOSIS — N184 Chronic kidney disease, stage 4 (severe): Secondary | ICD-10-CM | POA: Insufficient documentation

## 2017-08-01 NOTE — Assessment & Plan Note (Addendum)
  Lab Results  Component Value Date   HGB 9.8 (L) 07/31/2017   HGB 11.9 (L) 03/20/2017   HGB 16.9 02/06/2007     Normocytic and proprotional to creat clearance > no w/u for now but likely contributing to doe at higher levels of workload(bicycling up hill in this case as reported by pt)

## 2017-08-01 NOTE — Assessment & Plan Note (Signed)
Estimated Creatinine Clearance: 22.8 mL/min (A) (by C-G formula based on SCr of 3.29 mg/dL (H)).   Lab Results  Component Value Date   CREATININE 3.29 (H) 07/31/2017   CREATININE 3.32 (HH) 03/30/2017   CREATININE 3.00 (H) 03/20/2017    He does report h/o urinary hesitancy so will arrange for renal u/s awaiting formal renal eval   Note HC03 20 likely related to Cr clearance < 30 and contributing to his doe but not severe enough to add HC03 at this point.

## 2017-08-01 NOTE — Assessment & Plan Note (Signed)
07/31/2017  Walked RA x 3 laps @ 185 ft each stopped due to  End of study, fast pace, no   desat   - min sob  - Spirometry 07/31/2017  FEV1 2.08 (70%)  Ratio 74    Despite cxr dx of emphysema he does not meet the criteria for copd and I don't think he actually smoked the 10 pk year threshold but his doe is easily explained by his anemia and renal insuff the latter of which has reduced his buffering capacity significantly though not in the range usually needed to start oracit replacement therapy.  Will cancel further pulmonary f/u at this point and obtain renal u/s  / refer to renal    Total time devoted to counseling  > 50 % of initial 60 min office visit:  review case with pt/daughter  discussion of options/alternatives/ personally creating written customized instructions  in presence of pt  then going over those specific  Instructions directly with the pt including how to use all of the meds but in particular covering each new medication in detail and the difference between the maintenance= "automatic" meds and the prns using an action plan format for the latter (If this problem/symptom => do that organization reading Left to right).  Please see AVS from this visit for a full list of these instructions which I personally wrote for this pt and  are unique to this visit.

## 2017-08-03 ENCOUNTER — Telehealth: Payer: Self-pay | Admitting: *Deleted

## 2017-08-03 DIAGNOSIS — N184 Chronic kidney disease, stage 4 (severe): Secondary | ICD-10-CM

## 2017-08-03 LAB — RESPIRATORY ALLERGY PROFILE REGION II ~~LOC~~
Allergen, A. alternata, m6: <0.1 kU/L
Allergen, Cedar tree, t12: <0.1 kU/L
Allergen, Comm Silver Birch, t9: <0.1 kU/L
Allergen, Mulberry, t76: <0.1 kU/L
CLADOSPORIUM HERBARUM (M2) IGE: <0.1 kU/L
CLASS: 0
CLASS: 0
CLASS: 0
CLASS: 0
CLASS: 0
CLASS: 0
CLASS: 0
COMMON RAGWEED (SHORT) (W1) IGE: <0.1 kU/L
Cat Dander: <0.1 kU/L
Class: 0
Class: 0
Class: 0
Class: 0
Class: 0
Class: 0
Class: 0
Class: 0
Class: 0
Class: 0
Class: 0
Class: 0
Class: 0
Class: 0
Class: 0
Class: 0
Class: 0
IGE (IMMUNOGLOBULIN E), SERUM: 68 kU/L (ref <=114)
Pecan/Hickory Tree IgE: <0.1 kU/L

## 2017-08-03 LAB — D-DIMER, QUANTITATIVE: D-Dimer, Quant: 0.3 mcg/mL FEU (ref <0.50)

## 2017-08-03 LAB — INTERPRETATION:

## 2017-08-03 NOTE — Telephone Encounter (Signed)
LMTCB

## 2017-08-03 NOTE — Telephone Encounter (Signed)
-----   Message from Tanda Rockers, MD sent at 08/03/2017 11:41 AM EDT ----- No but he knows he has kidney issues from Dr Carlota Raspberry ----- Message ----- From: Rosana Berger, CMA Sent: 08/03/2017  10:38 AM To: Tanda Rockers, MD  [facility]- before I call him, did you already speak with him about why he doesn't need to come back here? thanks ----- Message ----- From: Tanda Rockers, MD Sent: 08/01/2017   6:29 AM To: Rosana Berger, CMA  Needs: Cancel f/u here and cancel pfts Renal u/s dx renal failure Refer to renal

## 2017-08-04 NOTE — Telephone Encounter (Signed)
Spoke with the pt and advised no f/u needed here and will cancel ov and pft  He is aware someone will be reaching out to him to schedule nephrology appt

## 2017-08-04 NOTE — Telephone Encounter (Signed)
Patient returned call from [place].  He is confused on why she called.  Please advise.  CB# [phone].

## 2017-08-04 NOTE — Telephone Encounter (Signed)
I had already informed Matthew [name] this is not patient's office. Sending to the right office.

## 2017-09-03 DIAGNOSIS — E872 Acidosis: Secondary | ICD-10-CM | POA: Diagnosis not present

## 2017-09-03 DIAGNOSIS — D631 Anemia in chronic kidney disease: Secondary | ICD-10-CM | POA: Diagnosis not present

## 2017-09-03 DIAGNOSIS — N184 Chronic kidney disease, stage 4 (severe): Secondary | ICD-10-CM | POA: Diagnosis not present

## 2017-09-03 DIAGNOSIS — R0600 Dyspnea, unspecified: Secondary | ICD-10-CM | POA: Diagnosis not present

## 2017-09-03 DIAGNOSIS — N1330 Unspecified hydronephrosis: Secondary | ICD-10-CM | POA: Diagnosis not present

## 2017-09-04 ENCOUNTER — Ambulatory Visit: Payer: Medicare Other | Admitting: Internal Medicine

## 2017-09-10 DIAGNOSIS — N130 Hydronephrosis with ureteropelvic junction obstruction: Secondary | ICD-10-CM | POA: Diagnosis not present

## 2017-09-10 DIAGNOSIS — N401 Enlarged prostate with lower urinary tract symptoms: Secondary | ICD-10-CM | POA: Diagnosis not present

## 2017-09-10 DIAGNOSIS — R338 Other retention of urine: Secondary | ICD-10-CM | POA: Diagnosis not present

## 2017-09-10 DIAGNOSIS — R972 Elevated prostate specific antigen [PSA]: Secondary | ICD-10-CM | POA: Diagnosis not present

## 2017-09-10 DIAGNOSIS — N189 Chronic kidney disease, unspecified: Secondary | ICD-10-CM | POA: Diagnosis not present

## 2017-09-16 DIAGNOSIS — N401 Enlarged prostate with lower urinary tract symptoms: Secondary | ICD-10-CM | POA: Diagnosis not present

## 2017-09-16 DIAGNOSIS — R972 Elevated prostate specific antigen [PSA]: Secondary | ICD-10-CM | POA: Diagnosis not present

## 2017-09-16 DIAGNOSIS — N130 Hydronephrosis with ureteropelvic junction obstruction: Secondary | ICD-10-CM | POA: Diagnosis not present

## 2017-09-16 DIAGNOSIS — R338 Other retention of urine: Secondary | ICD-10-CM | POA: Diagnosis not present

## 2017-09-16 DIAGNOSIS — N189 Chronic kidney disease, unspecified: Secondary | ICD-10-CM | POA: Diagnosis not present

## 2017-09-22 DIAGNOSIS — R338 Other retention of urine: Secondary | ICD-10-CM | POA: Diagnosis not present

## 2017-10-06 DIAGNOSIS — R339 Retention of urine, unspecified: Secondary | ICD-10-CM | POA: Diagnosis not present

## 2017-10-06 DIAGNOSIS — N401 Enlarged prostate with lower urinary tract symptoms: Secondary | ICD-10-CM | POA: Diagnosis not present

## 2017-10-06 DIAGNOSIS — N138 Other obstructive and reflux uropathy: Secondary | ICD-10-CM | POA: Diagnosis not present

## 2017-10-06 DIAGNOSIS — R972 Elevated prostate specific antigen [PSA]: Secondary | ICD-10-CM | POA: Diagnosis not present

## 2017-11-19 DIAGNOSIS — R972 Elevated prostate specific antigen [PSA]: Secondary | ICD-10-CM | POA: Diagnosis not present

## 2018-03-25 DIAGNOSIS — R972 Elevated prostate specific antigen [PSA]: Secondary | ICD-10-CM | POA: Diagnosis not present

## 2018-04-26 ENCOUNTER — Telehealth (INDEPENDENT_AMBULATORY_CARE_PROVIDER_SITE_OTHER): Payer: Medicare Other | Admitting: Family Medicine

## 2018-04-26 ENCOUNTER — Other Ambulatory Visit: Payer: Self-pay

## 2018-04-26 ENCOUNTER — Inpatient Hospital Stay (HOSPITAL_COMMUNITY)
Admission: EM | Admit: 2018-04-26 | Discharge: 2018-05-02 | DRG: 683 | Disposition: A | Discharge status: Home / Self Care | Payer: Medicare Other | Source: Ambulatory Visit | Attending: Internal Medicine | Admitting: Internal Medicine

## 2018-04-26 ENCOUNTER — Encounter (HOSPITAL_COMMUNITY): Payer: Self-pay | Admitting: Emergency Medicine

## 2018-04-26 ENCOUNTER — Encounter: Payer: Self-pay | Admitting: Family Medicine

## 2018-04-26 ENCOUNTER — Emergency Department (HOSPITAL_COMMUNITY): Payer: Medicare Other

## 2018-04-26 VITALS — BP 118/67 | HR 95 | Temp 98.4°F | Resp 16 | Ht 70.0 in | Wt 149.4 lb

## 2018-04-26 DIAGNOSIS — E1122 Type 2 diabetes mellitus with diabetic chronic kidney disease: Secondary | ICD-10-CM | POA: Diagnosis present

## 2018-04-26 DIAGNOSIS — N178 Other acute kidney failure: Secondary | ICD-10-CM | POA: Diagnosis not present

## 2018-04-26 DIAGNOSIS — Z87891 Personal history of nicotine dependence: Secondary | ICD-10-CM | POA: Diagnosis not present

## 2018-04-26 DIAGNOSIS — Z8249 Family history of ischemic heart disease and other diseases of the circulatory system: Secondary | ICD-10-CM | POA: Diagnosis not present

## 2018-04-26 DIAGNOSIS — N184 Chronic kidney disease, stage 4 (severe): Secondary | ICD-10-CM

## 2018-04-26 DIAGNOSIS — D649 Anemia, unspecified: Secondary | ICD-10-CM | POA: Diagnosis present

## 2018-04-26 DIAGNOSIS — F1010 Alcohol abuse, uncomplicated: Secondary | ICD-10-CM | POA: Diagnosis present

## 2018-04-26 DIAGNOSIS — E872 Acidosis, unspecified: Secondary | ICD-10-CM | POA: Diagnosis present

## 2018-04-26 DIAGNOSIS — D631 Anemia in chronic kidney disease: Secondary | ICD-10-CM | POA: Diagnosis not present

## 2018-04-26 DIAGNOSIS — Z791 Long term (current) use of non-steroidal anti-inflammatories (NSAID): Secondary | ICD-10-CM

## 2018-04-26 DIAGNOSIS — N390 Urinary tract infection, site not specified: Secondary | ICD-10-CM | POA: Diagnosis present

## 2018-04-26 DIAGNOSIS — I129 Hypertensive chronic kidney disease with stage 1 through stage 4 chronic kidney disease, or unspecified chronic kidney disease: Secondary | ICD-10-CM | POA: Diagnosis present

## 2018-04-26 DIAGNOSIS — N136 Pyonephrosis: Secondary | ICD-10-CM | POA: Diagnosis not present

## 2018-04-26 DIAGNOSIS — R4182 Altered mental status, unspecified: Secondary | ICD-10-CM | POA: Diagnosis not present

## 2018-04-26 DIAGNOSIS — R6889 Other general symptoms and signs: Secondary | ICD-10-CM

## 2018-04-26 DIAGNOSIS — J439 Emphysema, unspecified: Secondary | ICD-10-CM | POA: Diagnosis present

## 2018-04-26 DIAGNOSIS — R05 Cough: Secondary | ICD-10-CM | POA: Diagnosis not present

## 2018-04-26 DIAGNOSIS — N189 Chronic kidney disease, unspecified: Secondary | ICD-10-CM

## 2018-04-26 DIAGNOSIS — N289 Disorder of kidney and ureter, unspecified: Secondary | ICD-10-CM

## 2018-04-26 DIAGNOSIS — N319 Neuromuscular dysfunction of bladder, unspecified: Secondary | ICD-10-CM | POA: Diagnosis present

## 2018-04-26 DIAGNOSIS — R296 Repeated falls: Secondary | ICD-10-CM | POA: Diagnosis not present

## 2018-04-26 DIAGNOSIS — R278 Other lack of coordination: Secondary | ICD-10-CM | POA: Diagnosis present

## 2018-04-26 DIAGNOSIS — R1030 Lower abdominal pain, unspecified: Secondary | ICD-10-CM

## 2018-04-26 DIAGNOSIS — Z23 Encounter for immunization: Secondary | ICD-10-CM

## 2018-04-26 DIAGNOSIS — M5440 Lumbago with sciatica, unspecified side: Secondary | ICD-10-CM | POA: Diagnosis not present

## 2018-04-26 DIAGNOSIS — R52 Pain, unspecified: Secondary | ICD-10-CM | POA: Diagnosis not present

## 2018-04-26 DIAGNOSIS — N19 Unspecified kidney failure: Secondary | ICD-10-CM

## 2018-04-26 DIAGNOSIS — R319 Hematuria, unspecified: Principal | ICD-10-CM

## 2018-04-26 DIAGNOSIS — N179 Acute kidney failure, unspecified: Principal | ICD-10-CM | POA: Diagnosis present

## 2018-04-26 DIAGNOSIS — R350 Frequency of micturition: Secondary | ICD-10-CM | POA: Diagnosis not present

## 2018-04-26 DIAGNOSIS — N186 End stage renal disease: Secondary | ICD-10-CM

## 2018-04-26 HISTORY — DX: End stage renal disease: N18.6

## 2018-04-26 HISTORY — DX: Essential (primary) hypertension: I10

## 2018-04-26 HISTORY — DX: Type 2 diabetes mellitus without complications: E11.9

## 2018-04-26 LAB — POCT CBC
GRANULOCYTE PERCENT: 65.7 % (ref 37–80)
HCT, POC: 21.8 % — AB (ref 29–41)
Hemoglobin: 7.1 g/dL — AB (ref 11–14.6)
Lymph, poc: 2.7 (ref 0.6–3.4)
MCH, POC: 27.4 pg (ref 27–31.2)
MCHC: 32.6 g/dL (ref 31.8–35.4)
MCV: 84.1 fL (ref 76–111)
MID (cbc): 0.4 (ref 0–0.9)
MPV: 7.4 fL (ref 0–99.8)
PLATELET COUNT, POC: 449 10*3/uL — AB (ref 142–424)
POC Granulocyte: 5.8 (ref 2–6.9)
POC LYMPH PERCENT: 29.8 %L (ref 10–50)
POC MID %: 4.5 %M (ref 0–12)
RBC: 2.59 M/uL — AB (ref 4.69–6.13)
RDW, POC: 17.3 %
WBC: 8.9 10*3/uL (ref 4.6–10.2)

## 2018-04-26 LAB — URINALYSIS, ROUTINE W REFLEX MICROSCOPIC
Bilirubin Urine: NEGATIVE
Glucose, UA: NEGATIVE mg/dL
Ketones, ur: NEGATIVE mg/dL
Nitrite: NEGATIVE
PH: 6 (ref 5.0–8.0)
Protein, ur: 100 mg/dL — AB
Specific Gravity, Urine: 1.009 (ref 1.005–1.030)
WBC, UA: >50 WBC/hpf — ABNORMAL HIGH (ref 0–5)

## 2018-04-26 LAB — IRON AND TIBC
Iron: 52 ug/dL (ref 45–182)
Saturation Ratios: 24 % (ref 17.9–39.5)
TIBC: 213 ug/dL — ABNORMAL LOW (ref 250–450)
UIBC: 161 ug/dL

## 2018-04-26 LAB — CBC WITH DIFFERENTIAL/PLATELET
ABS IMMATURE GRANULOCYTES: 0.08 10*3/uL — AB (ref 0.00–0.07)
Basophils Absolute: 0 10*3/uL (ref 0.0–0.1)
Basophils Relative: 0 %
Eosinophils Absolute: 0.1 10*3/uL (ref 0.0–0.5)
Eosinophils Relative: 1 %
HCT: 21.1 % — ABNORMAL LOW (ref 39.0–52.0)
Hemoglobin: 6.6 g/dL — CL (ref 13.0–17.0)
Immature Granulocytes: 1 %
Lymphocytes Relative: 20 %
Lymphs Abs: 1.9 10*3/uL (ref 0.7–4.0)
MCH: 28.2 pg (ref 26.0–34.0)
MCHC: 31.3 g/dL (ref 30.0–36.0)
MCV: 90.2 fL (ref 80.0–100.0)
MONO ABS: 0.8 10*3/uL (ref 0.1–1.0)
Monocytes Relative: 8 %
NEUTROS ABS: 6.6 10*3/uL (ref 1.7–7.7)
Neutrophils Relative %: 70 %
Platelets: 461 10*3/uL — ABNORMAL HIGH (ref 150–400)
RBC: 2.34 MIL/uL — ABNORMAL LOW (ref 4.22–5.81)
RDW: 16.9 % — ABNORMAL HIGH (ref 11.5–15.5)
WBC: 9.5 10*3/uL (ref 4.0–10.5)
nRBC: 0 % (ref 0.0–0.2)

## 2018-04-26 LAB — CBC
HCT: 20.6 % — ABNORMAL LOW (ref 39.0–52.0)
Hemoglobin: 6.4 g/dL — CL (ref 13.0–17.0)
MCH: 26.9 pg (ref 26.0–34.0)
MCHC: 31.1 g/dL (ref 30.0–36.0)
MCV: 86.6 fL (ref 80.0–100.0)
Platelets: 365 10*3/uL (ref 150–400)
RBC: 2.38 MIL/uL — AB (ref 4.22–5.81)
RDW: 16.3 % — ABNORMAL HIGH (ref 11.5–15.5)
WBC: 9.5 10*3/uL (ref 4.0–10.5)
nRBC: 0 % (ref 0.0–0.2)

## 2018-04-26 LAB — COMPREHENSIVE METABOLIC PANEL
ALT: 6 U/L (ref 0–44)
AST: 9 U/L — ABNORMAL LOW (ref 15–41)
Albumin: 3.1 g/dL — ABNORMAL LOW (ref 3.5–5.0)
Alkaline Phosphatase: 51 U/L (ref 38–126)
Anion gap: 15 (ref 5–15)
BUN: 140 mg/dL — ABNORMAL HIGH (ref 8–23)
CALCIUM: 8.1 mg/dL — AB (ref 8.9–10.3)
CO2: 9 mmol/L — ABNORMAL LOW (ref 22–32)
Chloride: 109 mmol/L (ref 98–111)
Creatinine, Ser: 14.11 mg/dL — ABNORMAL HIGH (ref 0.61–1.24)
GFR calc Af Amer: 4 mL/min — ABNORMAL LOW (ref >60)
GFR, EST NON AFRICAN AMERICAN: 3 mL/min — AB (ref >60)
Glucose, Bld: 97 mg/dL (ref 70–99)
Potassium: 4.5 mmol/L (ref 3.5–5.1)
Sodium: 133 mmol/L — ABNORMAL LOW (ref 135–145)
Total Bilirubin: 0.4 mg/dL (ref 0.3–1.2)
Total Protein: 10.5 g/dL — ABNORMAL HIGH (ref 6.5–8.1)

## 2018-04-26 LAB — POCT URINALYSIS DIP (MANUAL ENTRY)
BILIRUBIN UA: NEGATIVE mg/dL
Bilirubin, UA: NEGATIVE
SPEC GRAV UA: 1.02 (ref 1.010–1.025)
UROBILINOGEN UA: 0.2 U/dL
pH, UA: 5.5 (ref 5.0–8.0)

## 2018-04-26 LAB — ABO/RH
ABO/RH(D): A POS
ABO/RH(D): A POS

## 2018-04-26 LAB — SODIUM, URINE, RANDOM
Sodium, Ur: 74 mmol/L
Sodium, Ur: 99 mmol/L

## 2018-04-26 LAB — IFOBT (OCCULT BLOOD): IFOBT: NEGATIVE

## 2018-04-26 LAB — CREATININE, URINE, RANDOM
Creatinine, Urine: 65.92 mg/dL
Creatinine, Urine: 34.51 mg/dL

## 2018-04-26 LAB — PREPARE RBC (CROSSMATCH)

## 2018-04-26 MED ORDER — SODIUM CHLORIDE 0.9% IV SOLUTION
Freq: Once | INTRAVENOUS | Status: AC
  Administered 2018-04-27: via INTRAVENOUS

## 2018-04-26 MED ORDER — SODIUM CHLORIDE 0.9 % IV BOLUS: 2000.0000 mL | Freq: Once | INTRAVENOUS | Status: DC

## 2018-04-26 MED ORDER — SODIUM CHLORIDE 0.9 % IV SOLN
1.0000 g | Freq: Once | INTRAVENOUS | Status: AC
  Administered 2018-04-26: 1 g via INTRAVENOUS
  Filled 2018-04-26: qty 10

## 2018-04-26 MED ORDER — HYDROXYZINE HCL 25 MG PO TABS
25.0000 mg | ORAL_TABLET | Freq: Three times a day (TID) | ORAL | Status: DC | PRN
  Administered 2018-04-30: 25 mg via ORAL
  Filled 2018-04-26 (×2): qty 1

## 2018-04-26 MED ORDER — DOCUSATE SODIUM 283 MG RE ENEM
1.0000 | ENEMA | RECTAL | Status: DC | PRN
  Filled 2018-04-26: qty 1

## 2018-04-26 MED ORDER — SODIUM CHLORIDE 0.9% IV SOLUTION
Freq: Once | INTRAVENOUS | Status: AC
  Administered 2018-04-26: 18:00:00 via INTRAVENOUS

## 2018-04-26 MED ORDER — SODIUM CHLORIDE 0.9 % IV SOLN
INTRAVENOUS | Status: DC | PRN
  Administered 2018-04-26: 500 mL via INTRAVENOUS

## 2018-04-26 MED ORDER — NEPRO/CARBSTEADY PO LIQD
237.0000 mL | Freq: Three times a day (TID) | ORAL | Status: DC | PRN
  Filled 2018-04-26: qty 237

## 2018-04-26 MED ORDER — CALCIUM CARBONATE ANTACID 1250 MG/5ML PO SUSP
500.0000 mg | Freq: Four times a day (QID) | ORAL | Status: DC | PRN
  Administered 2018-05-01: 500 mg via ORAL
  Filled 2018-04-26 (×3): qty 5

## 2018-04-26 MED ORDER — ONDANSETRON HCL 4 MG PO TABS: 4.0000 mg | ORAL_TABLET | Freq: Four times a day (QID) | ORAL | Status: DC | PRN

## 2018-04-26 MED ORDER — SORBITOL 70 % SOLN
30.0000 mL | Status: DC | PRN
  Filled 2018-04-26: qty 30

## 2018-04-26 MED ORDER — ACETAMINOPHEN 650 MG RE SUPP: 650.0000 mg | Freq: Four times a day (QID) | RECTAL | Status: DC | PRN

## 2018-04-26 MED ORDER — SODIUM CHLORIDE 0.9 % IV BOLUS
1500.0000 mL | Freq: Once | INTRAVENOUS | Status: AC
  Administered 2018-04-26: 1500 mL via INTRAVENOUS

## 2018-04-26 MED ORDER — SODIUM CHLORIDE 0.9 % IV SOLN
1.0000 g | INTRAVENOUS | Status: DC
  Administered 2018-04-27 – 2018-04-28 (×2): 1 g via INTRAVENOUS
  Filled 2018-04-26 (×4): qty 10

## 2018-04-26 MED ORDER — CAMPHOR-MENTHOL 0.5-0.5 % EX LOTN
1.0000 "application " | TOPICAL_LOTION | Freq: Three times a day (TID) | CUTANEOUS | Status: DC | PRN
  Filled 2018-04-26: qty 222

## 2018-04-26 MED ORDER — ONDANSETRON HCL 4 MG/2ML IJ SOLN
4.0000 mg | Freq: Four times a day (QID) | INTRAMUSCULAR | Status: DC | PRN
  Filled 2018-04-26: qty 2

## 2018-04-26 MED ORDER — ZOLPIDEM TARTRATE 5 MG PO TABS
5.0000 mg | ORAL_TABLET | Freq: Every evening | ORAL | Status: DC | PRN
  Administered 2018-04-29 – 2018-05-01 (×3): 5 mg via ORAL
  Filled 2018-04-26 (×3): qty 1

## 2018-04-26 MED ORDER — ACETAMINOPHEN 325 MG PO TABS
650.0000 mg | ORAL_TABLET | Freq: Four times a day (QID) | ORAL | Status: DC | PRN
  Administered 2018-04-29 – 2018-05-01 (×2): 650 mg via ORAL
  Filled 2018-04-26 (×2): qty 2

## 2018-04-26 MED ORDER — STERILE WATER FOR INJECTION IV SOLN
INTRAVENOUS | Status: DC
  Administered 2018-04-26 – 2018-04-27 (×2): via INTRAVENOUS
  Filled 2018-04-26 (×3): qty 850

## 2018-04-26 NOTE — ED Triage Notes (Signed)
Pt sent from PCP for UTI-possible early sepsis and low Hgb.

## 2018-04-26 NOTE — Patient Instructions (Addendum)
  It appears you have a urinary tract infection, which could also be within the prostate.  My concern is that infection has caused possible inflection of the bloodstream or early sepsis with your shaking chills.  Additionally your hemoglobin or anemia test is lower than previous testing.   Further testing will need to be done through the emergency room and possible admission to the hospital.  Please proceed to the emergency room after leaving our office.  Inform them that I did do some blood work and urine testing here in our office and they should be able to review those results.  If follow-up needed after the emergency room or hospitalization, I am happy to see you.  Thank you for coming in today.   If you have lab work done today you will be contacted with your lab results within the next 2 weeks.  If you have not heard from [place] then please contact us. The fastest way to get your results is to register for My Chart.   IF you received an x-ray today, you will receive an invoice from [facility]. Please contact [facility] at [phone] with questions or concerns regarding your invoice.   IF you received labwork today, you will receive an invoice from [facility]. Please contact [facility] at [phone] with questions or concerns regarding your invoice.   Our billing staff will not be able to assist you with questions regarding bills from these companies.  You will be contacted with the lab results as soon as they are available. The fastest way to get your results is to activate your My Chart account. Instructions are located on the last page of this paperwork. If you have not heard from [place] regarding the results in 2 weeks, please contact this office.

## 2018-04-26 NOTE — ED Notes (Signed)
Foley catheter inserted using sterile technique, second RN present.  Urine return visualized, yellow and cloudy.  UA and urine culture collected, sent to lab.

## 2018-04-26 NOTE — H&P (Signed)
History and Physical    Matthew [name] EZM:[id number] DOB: 1950/03/25 DOA: 04/26/2018  PCP: Wendie Agreste, MD Consultants:  Urology, nephrology; [place] - pulmonology Patient coming from:  Home - lives with daughter; Donald Prose: Daughter, [phone]  Chief Complaint: weakness  HPI: Matthew [name] is a 68 y.o. male with medical history significant of urinary retention; stage IV CKD with anemia presenting with weakness.  He reports difficulty with his urine.  He has had issues on and off for a month or longer.  He has been doing self-caths at home for months.  He is making urine "but it's bad though."  It is becoming difficult for him to make it.  Intermittent confusion.  +weak/tired.  He has talked about dialysis in the past, maybe a month ago.   ED Course:   Referred from urgent care for symptomatic anemia.  Not doing well for weeks to months - confusion, weakness, falling.  Has h/o bladder outlet obstruction with intermittent caths at home.  Acute renal failure, creatinine 14.  Dr. Jonnie Finner to consult, likely needs admission at [facility].  Given Rocephin for UTI, foley placed with 450 cc out.  Ordered 1 unit PRBC for Hgb 6.6.  Possible pneumothorax on CXR but CT chest shows huge bulla.  Review of Systems: As per HPI; otherwise review of systems reviewed and negative.   Ambulatory Status:  Ambulates without assistance  Past Medical History:  Diagnosis Date   Diabetes ([place])    ESRD (end stage renal disease) ([place]) 04/26/2018   Essential hypertension     Past Surgical History:  Procedure Laterality Date   finger lanced      Social History   Socioeconomic History   Marital status: Single    Spouse name: Not on file   Number of children: Not on file   Years of education: Not on file   Highest education level: Not on file  Occupational History   Occupation: dietician  Social Needs   Financial resource strain: Not on file   Food insecurity:    Worry: Not on file    Inability:  Not on file   Transportation needs:    Medical: Not on file    Non-medical: Not on file  Tobacco Use   Smoking status: Former Smoker    Packs/day: 0.25    Years: 1.00    Pack years: 0.25    Types: Cigarettes    Last attempt to quit: 01/27/1997    Years since quitting: 21.2   Smokeless tobacco: Never Used  Substance and Sexual Activity   Alcohol use: Yes    Alcohol/week: 10.0 - 12.0 standard drinks    Types: 10 - 12 Cans of beer per week    Comment: "I mean I drinks, but not like that", denies every day   Drug use: No   Sexual activity: Yes  Lifestyle   Physical activity:    Days per week: Not on file    Minutes per session: Not on file   Stress: Not on file  Relationships   Social connections:    Talks on phone: Not on file    Gets together: Not on file    Attends religious service: Not on file    Active member of club or organization: Not on file    Attends meetings of clubs or organizations: Not on file    Relationship status: Not on file   Intimate partner violence:    Fear of current or ex partner: Not on file  Emotionally abused: Not on file    Physically abused: Not on file    Forced sexual activity: Not on file  Other Topics Concern   Not on file  Social History Narrative   Not on file    No Known Allergies  Family History  Problem Relation Age of Onset   Hypertension Mother    Migraines Mother    Heart disease Mother     Prior to Admission medications   Not on File    Physical Exam: Vitals:   04/26/18 1210 04/26/18 1609 04/26/18 1746  BP: 124/64 (!) 166/96 (!) 154/83  Pulse: 95 (!) 116 (!) 110  Resp: 17 15 18   Temp: 98 F (36.7 C)  97.6 F (36.4 C)  TempSrc: Oral  Oral  SpO2: 100% 100% 100%      General: Weak, tremulous, mildly confused  Eyes:  PERRL, EOMI, normal lids, iris  ENT:  grossly normal hearing, lips & tongue, mmm; poor dentition  Neck:  no LAD, masses or thyromegaly  Cardiovascular:  RRR, no m/r/g. No LE  edema.   Respiratory:   CTA bilaterally with no wheezes/rales/rhonchi.  Normal respiratory effort.  Abdomen:  soft, mildly diffusely TTP, mildly distended, NABS  Skin:  no rash or induration seen on limited exam  Musculoskeletal:  grossly normal tone BUE/BLE, good ROM, no bony abnormality  Psychiatric:  Blunted mood and affect, speech fluent and appropriate, AOx2, poor historian  Neurologic:  CN 2-12 grossly intact, moves all extremities in coordinated fashion, tremulous    Radiological Exams on Admission: Ct Head Wo Contrast  Result Date: 04/26/2018 CLINICAL DATA:  Altered mental status.  Cough for 3 weeks. EXAM: CT HEAD WITHOUT CONTRAST TECHNIQUE: Contiguous axial images were obtained from the base of the skull through the vertex without intravenous contrast. COMPARISON:  None. FINDINGS: Brain: No evidence of acute infarction, hemorrhage, hydrocephalus, extra-axial collection or mass lesion/mass effect. Mild atrophy and chronic microvascular ischemic change noted. Vascular: No hyperdense vessel or unexpected calcification. Skull: Negative. Sinuses/Orbits: Negative. Other: None. IMPRESSION: No acute abnormality. Mild atrophy and chronic microvascular ischemic change. Electronically Signed   By: Inge Rise M.D.   On: 04/26/2018 13:41   Ct Chest Wo Contrast  Result Date: 04/26/2018 CLINICAL DATA:  Cough. EXAM: CT CHEST WITHOUT CONTRAST TECHNIQUE: Multidetector CT imaging of the chest was performed following the standard protocol without IV contrast. COMPARISON:  Radiograph of same day. FINDINGS: Cardiovascular: Atherosclerosis of thoracic aorta is noted without aneurysm formation. Normal cardiac size. No pericardial effusion. Mediastinum/Nodes: No enlarged mediastinal or axillary lymph nodes. Thyroid gland, trachea, and esophagus demonstrate no significant findings. Lungs/Pleura: No pneumothorax or pleural effusion is noted. Large solitary bulla is noted in left upper lobe measuring 15 x  10 cm. Right lung is clear. No pulmonary consolidation is noted. Upper Abdomen: There appears to be bilateral hydronephrosis present of unknown etiology. No definite calculi are noted. Musculoskeletal: No chest wall mass or suspicious bone lesions identified. IMPRESSION: Large solitary bulla is noted in left upper lobe measuring 15 x 10 cm. No pneumothorax or pulmonary consolidation is noted. Incidental note is made of bilateral hydronephrosis seen in visualized portion of upper abdomen. Further evaluation with renal ultrasound or CT urogram is recommended. Aortic Atherosclerosis (ICD10-I70.0). Electronically Signed   By: Marijo Conception, M.D.   On: 04/26/2018 13:46   Dg Chest Port 1 View  Result Date: 04/26/2018 CLINICAL DATA:  Cough for 2 weeks. EXAM: PORTABLE CHEST 1 VIEW COMPARISON:  03/20/2017 FINDINGS: The cardiomediastinal  silhouette is unchanged with borderline to mild cardiomegaly. Aortic atherosclerosis is noted. There is chronic lucency in the left upper hemithorax with a large apical bulla shown on multiple prior studies, however this area is more hyperlucent on today's study now with a suspected pleural line at the level of the aortic knob concerning for a new pneumothorax. Mild scarring and/or subsegmental atelectasis is present in the left mid lung. No pleural effusion is identified. No acute osseous abnormality is seen. IMPRESSION: Chronic lucency in the left upper hemithorax with different appearance on today's study concerning for a pneumothorax superimposed on bullous disease. Chest CT could be considered for further evaluation. Critical Value/emergent results were called by telephone at the time of interpretation on 04/26/2018 at 1:15 pm to Dr. Quintella Reichert , who verbally acknowledged these results. Electronically Signed   By: Logan Bores M.D.   On: 04/26/2018 13:18    EKG: Independently reviewed.  NSR with rate 66; nonspecific ST changes with no evidence of acute ischemia   Labs on  Admission: I have personally reviewed the available labs and imaging studies at the time of the admission.  Pertinent labs:   CO2 9 BUN 140/Creatinine 14.11/GFR 3 Calcium 8.1 Albumin 3.1 TP 10.5 WBC 9.5 Hgb 6.6 Platelets 461 UA: moderate Hgb, 100 protein, many bacteria, >50 WBC  Assessment/Plan Principal Problem:   Acute on chronic renal failure (HCC) Active Problems:   Alcohol abuse   Symptomatic anemia   Metabolic acidosis   Acute lower UTI   Acute on chronic renal failure with metabolic acidosis -Patient with h/o progressive CKD, most recently stage IV according to the chart -While it is possible that he will recover his renal function to some extent and that his current issue is related to AKI and UTI, it appears highly likely that he has progressed to ESRD and is in need of HD -His AMS and weakness are likely related to uremia -His metabolic acidosis is also likely related to uremia -Will admit to [facility] -Appreciate nephrology assistance -Will treat for now with HCO3 drip and monitor daily BMP -He appears likely to need temporary HD catheter for initiation of HD -Will also will likely need vascular access mapping for fistula -Will need clipping  UTI -He has h/o urinary retention -This may be exacerbated by UTI -Will treat with Rocephin for now -Urine culture pending  Symptomatic anemia -Patient with h/o anemia from chronic disease -Now with worsening anemia, Hgb 6.6 -Normocytic -Likely associated with CKD -Hemoccult negative -In accordance with current transfusion guidelines due to blood shortage because of COVID-19, will transfuse 1 unit PRBC and recheck CBC after transfusion and in AM -Goal Hgb >7 and without symptoms  ETOH abuse -Patient with regular ETOH use, possible dependence -He is at risk for complications of withdrawal including seizures, DTs -Will observe for now without CIWA, but will have low threshold to add CIWA if concerns develop  DVT  prophylaxis:  SCDs Code Status:  Full - confirmed with patient/family Family Communication: None present; spoke with daughter by telephone  Disposition Plan:  Home once clinically improved Consults called: Nephrology  Admission status: Admit - It is my clinical opinion that admission to INPATIENT is reasonable and necessary because of the expectation that this patient will require hospital care that crosses at least 2 midnights to treat this condition based on the medical complexity of the problems presented.  Given the aforementioned information, the predictability of an adverse outcome is felt to be significant.    Karmen Bongo  MD [facility]   How to contact the [facility] Attending or Consulting provider [place] or covering provider during after hours [place], for this patient?  1. Check the care team in [facility] and look for a) attending/consulting TR[facility] provider listed and b) the [facility] team listed 2. Log into [email or web address] and use [place]'s universal password to access. If you do not have the password, please contact the hospital operator. 3. Locate the [facility] provider you are looking for under [facility] and page to a number that you can be directly reached. 4. If you still have difficulty reaching the provider, please page the [facility] (Director on Call) for the Hospitalists listed on amion for assistance.   04/26/2018, 6:15 PM

## 2018-04-26 NOTE — ED Provider Notes (Signed)
[facility] DEPT Provider Note   CSN: [id number] Arrival date & time: 04/26/18  1208    History   Chief Complaint Chief Complaint  Patient presents with  . sent from PCP  . Abnormal Lab  . Urinary Tract Infection    HPI Matthew [name] is a 68 y.o. male.     The history is provided by the patient, a relative and medical records. No language interpreter was used.  Abnormal Lab  Urinary Tract Infection   Matthew [name] is a 68 y.o. male who presents to the Emergency Department complaining of possible UTI.   Level V caveat due to confusion. History is provided by patient, daughter and medical records. He was referred to the emergency department from [facility] urgent care for possible urinary tract infection and anemia. He reports feeling poorly for the last several months with generalized weakness and frequent falls. Last fall was about two weeks ago. He does report that he feels confused at times. He denies any fevers, nausea, vomiting. He does have occasional cough over the last 2 to 3 weeks. His daughter reports progressive generalized weakness over the last few months with associated unsteady gait.Marland Kitchen He lives at home with his daughter. No known sick contacts. He does have a history of chronic renal insufficiency and performs intermittent casts at home. Records reviewed from urgent care, UA is concerning for UTI. Point-of-care CBC with anemia - hemoglobin of 7.1.   History reviewed. No pertinent past medical history.  Patient Active Problem List   Diagnosis Date Noted  . Chronic renal failure, stage 4 (severe) ([place]) 08/01/2017  . Anemia of chronic kidney failure 08/01/2017  . DOE (dyspnea on exertion) 07/31/2017    Past Surgical History:  Procedure Laterality Date  . finger lanced          Home Medications    Prior to Admission medications   Medication Sig Start Date End Date Taking? Authorizing Provider  ibuprofen (ADVIL,MOTRIN) 200 MG  tablet Take 800 mg by mouth every 4 (four) hours as needed for moderate pain.   Yes [provider]    Family History Family History  Problem Relation Age of Onset  . Hypertension Mother   . Migraines Mother   . Heart disease Mother     Social History Social History   Tobacco Use  . Smoking status: Former Smoker    Packs/day: 0.25    Years: 1.00    Pack years: 0.25    Types: Cigarettes    Last attempt to quit: 01/27/1997    Years since quitting: 21.2  . Smokeless tobacco: Never Used  Substance Use Topics  . Alcohol use: Yes    Alcohol/week: 10.0 - 12.0 standard drinks    Types: 10 - 12 Cans of beer per week  . Drug use: No     Allergies   Patient has no known allergies.   Review of Systems Review of Systems  All other systems reviewed and are negative.    Physical Exam Updated Vital Signs BP 124/64 (BP Location: Left Arm)   Pulse 95   Temp 98 F (36.7 C) (Oral)   Resp 17   SpO2 100%   Physical Exam Vitals signs and nursing note reviewed.  Constitutional:      Appearance: He is well-developed.  HENT:     Head: Normocephalic and atraumatic.  Cardiovascular:     Rate and Rhythm: Normal rate and regular rhythm.     Heart sounds: No murmur.  Pulmonary:     Effort: Pulmonary effort is normal. No respiratory distress.     Breath sounds: Normal breath sounds.  Abdominal:     Palpations: Abdomen is soft.     Tenderness: There is no guarding or rebound.     Comments: Suprapubic tenderness  Musculoskeletal:        General: No swelling or tenderness.  Skin:    General: Skin is warm and dry.  Neurological:     Mental Status: He is alert.     Comments: Confused.  Slow to answer questions.  Oriented to place.  Disoriented to time.  5/5 strength in all four extremities.    Psychiatric:     Comments: Mildly anxious.       ED Treatments / Results  Labs (all labs ordered are listed, but only abnormal results are displayed) Labs Reviewed   COMPREHENSIVE METABOLIC PANEL - Abnormal; Notable for the following components:      Result Value   Sodium 133 (*)    CO2 9 (*)    BUN 140 (*)    Creatinine, Ser 14.11 (*)    Calcium 8.1 (*)    Total Protein 10.5 (*)    Albumin 3.1 (*)    AST 9 (*)    GFR calc non Af Amer 3 (*)    GFR calc Af Amer 4 (*)    All other components within normal limits  CBC WITH DIFFERENTIAL/PLATELET - Abnormal; Notable for the following components:   RBC 2.34 (*)    Hemoglobin 6.6 (*)    HCT 21.1 (*)    RDW 16.9 (*)    Platelets 461 (*)    Abs Immature Granulocytes 0.08 (*)    All other components within normal limits  URINALYSIS, ROUTINE W REFLEX MICROSCOPIC - Abnormal; Notable for the following components:   APPearance CLOUDY (*)    Hgb urine dipstick MODERATE (*)    Protein, ur 100 (*)    Leukocytes,Ua LARGE (*)    WBC, UA >50 (*)    Bacteria, UA MANY (*)    All other components within normal limits  URINE CULTURE  TYPE AND SCREEN  PREPARE RBC (CROSSMATCH)  ABO/RH    EKG None  Radiology Ct Head Wo Contrast  Result Date: 04/26/2018 CLINICAL DATA:  Altered mental status.  Cough for 3 weeks. EXAM: CT HEAD WITHOUT CONTRAST TECHNIQUE: Contiguous axial images were obtained from the base of the skull through the vertex without intravenous contrast. COMPARISON:  None. FINDINGS: Brain: No evidence of acute infarction, hemorrhage, hydrocephalus, extra-axial collection or mass lesion/mass effect. Mild atrophy and chronic microvascular ischemic change noted. Vascular: No hyperdense vessel or unexpected calcification. Skull: Negative. Sinuses/Orbits: Negative. Other: None. IMPRESSION: No acute abnormality. Mild atrophy and chronic microvascular ischemic change. Electronically Signed   By: Inge Rise M.D.   On: 04/26/2018 13:41   Ct Chest Wo Contrast  Result Date: 04/26/2018 CLINICAL DATA:  Cough. EXAM: CT CHEST WITHOUT CONTRAST TECHNIQUE: Multidetector CT imaging of the chest was performed  following the standard protocol without IV contrast. COMPARISON:  Radiograph of same day. FINDINGS: Cardiovascular: Atherosclerosis of thoracic aorta is noted without aneurysm formation. Normal cardiac size. No pericardial effusion. Mediastinum/Nodes: No enlarged mediastinal or axillary lymph nodes. Thyroid gland, trachea, and esophagus demonstrate no significant findings. Lungs/Pleura: No pneumothorax or pleural effusion is noted. Large solitary bulla is noted in left upper lobe measuring 15 x 10 cm. Right lung is clear. No pulmonary consolidation is noted. Upper Abdomen: There appears  to be bilateral hydronephrosis present of unknown etiology. No definite calculi are noted. Musculoskeletal: No chest wall mass or suspicious bone lesions identified. IMPRESSION: Large solitary bulla is noted in left upper lobe measuring 15 x 10 cm. No pneumothorax or pulmonary consolidation is noted. Incidental note is made of bilateral hydronephrosis seen in visualized portion of upper abdomen. Further evaluation with renal ultrasound or CT urogram is recommended. Aortic Atherosclerosis (ICD10-I70.0). Electronically Signed   By: Marijo Conception, M.D.   On: 04/26/2018 13:46   Dg Chest Port 1 View  Result Date: 04/26/2018 CLINICAL DATA:  Cough for 2 weeks. EXAM: PORTABLE CHEST 1 VIEW COMPARISON:  03/20/2017 FINDINGS: The cardiomediastinal silhouette is unchanged with borderline to mild cardiomegaly. Aortic atherosclerosis is noted. There is chronic lucency in the left upper hemithorax with a large apical bulla shown on multiple prior studies, however this area is more hyperlucent on today's study now with a suspected pleural line at the level of the aortic knob concerning for a new pneumothorax. Mild scarring and/or subsegmental atelectasis is present in the left mid lung. No pleural effusion is identified. No acute osseous abnormality is seen. IMPRESSION: Chronic lucency in the left upper hemithorax with different appearance on  today's study concerning for a pneumothorax superimposed on bullous disease. Chest CT could be considered for further evaluation. Critical Value/emergent results were called by telephone at the time of interpretation on 04/26/2018 at 1:15 pm to Dr. Quintella Reichert , who verbally acknowledged these results. Electronically Signed   By: Logan Bores M.D.   On: 04/26/2018 13:18    Procedures Procedures (including critical care time) CRITICAL CARE Performed by: Quintella Reichert   Total critical care time: 35 minutes  Critical care time was exclusive of separately billable procedures and treating other patients.  Critical care was necessary to treat or prevent imminent or life-threatening deterioration.  Critical care was time spent personally by me on the following activities: development of treatment plan with patient and/or surrogate as well as nursing, discussions with consultants, evaluation of patient's response to treatment, examination of patient, obtaining history from patient or surrogate, ordering and performing treatments and interventions, ordering and review of laboratory studies, ordering and review of radiographic studies, pulse oximetry and re-evaluation of patient's condition.  Medications Ordered in ED Medications  0.9 %  sodium chloride infusion (Manually program via Guardrails IV Fluids) (has no administration in time range)  cefTRIAXone (ROCEPHIN) 1 g in sodium chloride 0.9 % 100 mL IVPB (has no administration in time range)     Initial Impression / Assessment and Plan / ED Course  I have reviewed the triage vital signs and the nursing notes.  Pertinent labs & imaging results that were available during my care of the patient were reviewed by me and considered in my medical decision making (see chart for details).        Patient presents to the emergency department for evaluation of anemia. He is confused on examination but non-toxic appearing. Labs today are significant  for hemoglobin of 6.6, CMP with acute on chronic renal failure with significant uremia. A Foley catheter was placed with return of 450 mL of urine. On chest x-ray he did have a bleb versus pneumothorax and a CT chest was ordered to confirm. CT has large bleb but no evidence of pneumothorax. Discussed with Dr. Jonnie Finner with nephrology, who will see the patient in consult. Hospitalist consulted for admission for further treatment. Discussed with patient's daughter findings of studies and plan for admission.  Final Clinical Impressions(s) / ED Diagnoses   Final diagnoses:  None    ED Discharge Orders    None       Quintella Reichert, MD 04/26/18 1520

## 2018-04-26 NOTE — Addendum Note (Signed)
Addended by: Ileana Roup on: 04/26/2018 11:49 AM   Modules accepted: Orders

## 2018-04-26 NOTE — ED Notes (Signed)
Date and time results received: 04/26/18 1:13 PM  (use smartphrase ".now" to insert current time)  Test: Hgb Critical Value: 6.6  Name of Provider Notified: Ralene Bathe MD

## 2018-04-26 NOTE — ED Notes (Signed)
Notified [facility] for transport [id number].

## 2018-04-26 NOTE — ED Notes (Signed)
Empty foley 600ML

## 2018-04-26 NOTE — Consult Note (Addendum)
Renal Service Consult Note [facility]  Matthew [name] 04/26/2018 Matthew [name] Requesting Physician:  Dr Ralene Bathe  Reason for Consult:  Acute on CRF HPI: The patient is a 68 y.o. year-old with hx of CKD IV, possible COPD and anemia who was sent by urgent care to ED for UTI and possible sepsis/ low Hb.  Also confusion.  In ED work up showed severe azotemia and CT chest showed bilat hydronephrosis. We are asked to see for renal failure.   Only old creat here was 3.2 in July 2019 at time of pulm outpt visit for intermittent DOE.  Patient denies any hx of seeing kidney specialist but he is also sig confused.  He has hx of urinary retention per the ED MD and does self-cath at home.  When asked he didn't clearly answer how much / what schedule he follows for self -cath at home.   Pt had office visit from 02/2017 w/ PCP for urinary frequency and SOB x 6 mos. His BS was normal, prostate tests were sent off, exam was suspicious for enlarged prostate. Blood was drawn.    Poor historian today, +SOB, no abd pain, denies n/v/d.  +nsaids.   CT chest done today > renal results as follows > " Incidental note is made of bilateral hydronephrosis seen in visualized portion of upper abdomen. Further evaluation with renal ultrasound or CT urogram is recommended."   ROS  denies CP  no joint pain   no HA  no blurry vision  no rash  no diarrhea  no nausea/ vomiting   Past Medical History History reviewed. No pertinent past medical history. Past Surgical History  Past Surgical History:  Procedure Laterality Date  . finger lanced     Family History  Family History  Problem Relation Age of Onset  . Hypertension Mother   . Migraines Mother   . Heart disease Mother    Social History  reports that he quit smoking about 21 years ago. His smoking use included cigarettes. He has a 0.25 pack-year smoking history. He has never used smokeless tobacco. He reports current alcohol use of about  10.0 - 12.0 standard drinks of alcohol per week. He reports that he does not use drugs. Allergies No Known Allergies Home medications Prior to Admission medications   Medication Sig Start Date End Date Taking? Authorizing Provider  ibuprofen (ADVIL,MOTRIN) 200 MG tablet Take 800 mg by mouth every 4 (four) hours as needed for moderate pain.   Yes [provider]   Liver Function Tests Recent Labs  Lab 04/26/18 1253  AST 9*  ALT 6  ALKPHOS 51  BILITOT 0.4  PROT 10.5*  ALBUMIN 3.1*   No results for input(s): LIPASE, AMYLASE in the last 168 hours. CBC Recent Labs  Lab 04/26/18 1047 04/26/18 1253  WBC 8.9 9.5  NEUTROABS  --  6.6  HGB 7.1* 6.6*  HCT 21.8* 21.1*  MCV 84.1 90.2  PLT  --  347*   Basic Metabolic Panel Recent Labs  Lab 04/26/18 1253  NA 133*  K 4.5  CL 109  CO2 9*  GLUCOSE 97  BUN 140*  CREATININE 14.11*  CALCIUM 8.1*   Iron/TIBC/Ferritin/ %Sat No results found for: IRON, TIBC, FERRITIN, IRONPCTSAT  Vitals:   04/26/18 1210  BP: 124/64  Pulse: 95  Resp: 17  Temp: 98 F (36.7 C)  TempSrc: Oral  SpO2: 100%   Exam Gen thin AAM, chron ill appearing, tremulous, Ox 2.5 No rash, cyanosis or  gangrene Sclera anicteric, throat dry   No jvd or bruits, flat neck veins Chest clear bilat to bases, no rales or wheezing RRR no MRG, bounding PMI Abd soft ntnd no mass or ascites +bs GU normal male, foley placed draining milky white liquid MS no joint effusions or deformity Ext no LE or UE edema, no wounds or ulcers Neuro is alert, Ox 3 , nf , no asterixis    Home meds:  - ibuprofen 800 qid prn   UA - no rbcs > 50 wbc ++bact, 100 prot   Assessment: 1. Renal failure - acute CKD IV, +urine retention w/ bilat hydro and purulent UTI. Hx of self-cath at home.  Foley placed here, pt is also dry. Not hypotensive. +taking nsaids at home.  Confused but no asterixis. Recommend supportive care w/ IVF's and IV abx, follow clinical response, try to get  prior records/ imaging, get f/u renal US in 24 hrs. If needs HD patient agreeable.  Admit to [facility] please. Will follow.  2. Anemia - Hb 6.6, prob related to advanced CKD , r/o other causes. Transfuse prn per primary. Check fe/ tibc.  3. Metabolic acidosis - will order bicarb gtt and saline bolus, I/O daily wt 4. Pyuria - grossly and per UA, abx per primary    Plan: as above      [facility] 04/26/2018, 3:02 PM

## 2018-04-26 NOTE — Progress Notes (Signed)
CC- Patient has feelings of hot/cold off/on with some body aches, urine frequency and low back pain with a little burning when urinate. Patient was told to come in to leave a urine sample and daughter is requesting some blood work as well. Labs has been put in to the computer.

## 2018-04-26 NOTE — ED Notes (Signed)
ED TO INPATIENT HANDOFF REPORT  ED Nurse Name and Phone #: [phone]  S Name/Age/Gender Donzetta Kohut 68 y.o. male Room/Bed: WA14/WA14  Code Status   Code Status: Not on file  Home/SNF/Other Home Patient oriented to: place Is this baseline? No   Triage Complete: Triage complete  Chief Complaint UTI Sx; Low Hgl  Triage Note Pt sent from PCP for UTI-possible early sepsis and low Hgb.    Allergies No Known Allergies  Level of Care/Admitting Diagnosis ED Disposition    ED Disposition Condition Comment   Admit  Hospital Area: [place] [[id number]]  Level of Care: Telemetry Medical [[id number]]  Diagnosis: Acute on chronic renal failure [facility]) [[id number]]  Admitting Physician: Karmen Bongo [[id number]]  Attending Physician: Karmen Bongo [[id number]]  Estimated length of stay: 3 - 4 days  Certification:: I certify this patient will need inpatient services for at least 2 midnights  PT Class (Do Not Modify): Inpatient [101]  PT Acc Code (Do Not Modify): Private [1]       B Medical/Surgery History Past Medical History:  Diagnosis Date  . Diabetes ([place])   . ESRD (end stage renal disease) ([place]) 04/26/2018  . Essential hypertension    Past Surgical History:  Procedure Laterality Date  . finger lanced       A IV Location/Drains/Wounds Patient Lines/Drains/Airways Status   Active Line/Drains/Airways    Name:   Placement date:   Placement time:   Site:   Days:   Peripheral IV 04/26/18 Left Antecubital   04/26/18    1348    Antecubital   less than 1   Peripheral IV 04/26/18 Left Forearm   04/26/18    1539    Forearm   less than 1   Urethral Catheter C Keven Osborn, RN Non-latex 16 Fr.   04/26/18    1433    Non-latex   less than 1          Intake/Output Last 24 hours No intake or output data in the 24 hours ending 04/26/18 1629  Labs/Imaging Results for orders placed or performed during the hospital encounter of 04/26/18 (from the past 48 hour(s))   Comprehensive metabolic panel     Status: Abnormal   Collection Time: 04/26/18 12:53 PM  Result Value Ref Range   Sodium 133 (L) 135 - 145 mmol/L   Potassium 4.5 3.5 - 5.1 mmol/L   Chloride 109 98 - 111 mmol/L   CO2 9 (L) 22 - 32 mmol/L   Glucose, Bld 97 70 - 99 mg/dL   BUN 140 (H) 8 - 23 mg/dL    Comment: RESULTS CONFIRMED BY MANUAL DILUTION   Creatinine, Ser 14.11 (H) 0.61 - 1.24 mg/dL   Calcium 8.1 (L) 8.9 - 10.3 mg/dL   Total Protein 10.5 (H) 6.5 - 8.1 g/dL   Albumin 3.1 (L) 3.5 - 5.0 g/dL   AST 9 (L) 15 - 41 U/L   ALT 6 0 - 44 U/L   Alkaline Phosphatase 51 38 - 126 U/L   Total Bilirubin 0.4 0.3 - 1.2 mg/dL   GFR calc non Af Amer 3 (L) >60 mL/min   GFR calc Af Amer 4 (L) >60 mL/min   Anion gap 15 5 - 15    Comment: Performed at [facility], [place] [address] [place], [place] [address]  CBC with Differential     Status: Abnormal   Collection Time: 04/26/18 12:53 PM  Result Value Ref Range   WBC 9.5 4.0 -  10.5 K/uL   RBC 2.34 (L) 4.22 - 5.81 MIL/uL   Hemoglobin 6.6 (LL) 13.0 - 17.0 g/dL    Comment: REPEATED TO VERIFY THIS CRITICAL RESULT HAS VERIFIED AND BEEN CALLED TO S LEONARD,RN BY MARVIN SCOTTON ON 03 30 2020 AT 1311, AND HAS BEEN READ BACK.     HCT 21.1 (L) 39.0 - 52.0 %   MCV 90.2 80.0 - 100.0 fL   MCH 28.2 26.0 - 34.0 pg   MCHC 31.3 30.0 - 36.0 g/dL   RDW 16.9 (H) 11.5 - 15.5 %   Platelets 461 (H) 150 - 400 K/uL   nRBC 0.0 0.0 - 0.2 %   Neutrophils Relative % 70 %   Neutro Abs 6.6 1.7 - 7.7 K/uL   Lymphocytes Relative 20 %   Lymphs Abs 1.9 0.7 - 4.0 K/uL   Monocytes Relative 8 %   Monocytes Absolute 0.8 0.1 - 1.0 K/uL   Eosinophils Relative 1 %   Eosinophils Absolute 0.1 0.0 - 0.5 K/uL   Basophils Relative 0 %   Basophils Absolute 0.0 0.0 - 0.1 K/uL   Immature Granulocytes 1 %   Abs Immature Granulocytes 0.08 (H) 0.00 - 0.07 K/uL    Comment: Performed at [facility], [place] [address] [place], [place] [address]  Type  and screen [place]     Status: None (Preliminary result)   Collection Time: 04/26/18 12:53 PM  Result Value Ref Range   ABO/RH(D) A POS    Antibody Screen NEG    Sample Expiration      04/29/2018 Performed at [facility], [place] [address] [place], [place] [address]    Unit Number [id number]    Blood Component Type RED CELLS,LR    Unit division 00    Status of Unit ALLOCATED    Transfusion Status OK TO TRANSFUSE    Crossmatch Result Compatible   ABO/Rh     Status: None   Collection Time: 04/26/18 12:53 PM  Result Value Ref Range   ABO/RH(D)      A POS Performed at [facility], [place] [address] [place], [place] [address]   Prepare RBC     Status: None   Collection Time: 04/26/18  1:15 PM  Result Value Ref Range   Order Confirmation      ORDER PROCESSED BY BLOOD BANK Performed at [place] [address] [place], [place] [address]   Urinalysis, Routine w reflex microscopic     Status: Abnormal   Collection Time: 04/26/18  2:30 PM  Result Value Ref Range   Color, Urine YELLOW YELLOW   APPearance CLOUDY (A) CLEAR   Specific Gravity, Urine 1.009 1.005 - 1.030   pH 6.0 5.0 - 8.0   Glucose, UA NEGATIVE NEGATIVE mg/dL   Hgb urine dipstick MODERATE (A) NEGATIVE   Bilirubin Urine NEGATIVE NEGATIVE   Ketones, ur NEGATIVE NEGATIVE mg/dL   Protein, ur 100 (A) NEGATIVE mg/dL   Nitrite NEGATIVE NEGATIVE   Leukocytes,Ua LARGE (A) NEGATIVE   WBC, UA >50 (H) 0 - 5 WBC/hpf   Bacteria, UA MANY (A) NONE SEEN   WBC Clumps PRESENT     Comment: Performed at [facility], [place] [address] [place], [place] [address]  Creatinine, urine, random     Status: None   Collection Time: 04/26/18  2:30 PM  Result Value Ref Range   Creatinine, Urine 65.92 mg/dL    Comment: Performed at [facility]  [place] [address] [place], [place] [address]  Sodium, urine, random     Status:  None   Collection Time: 04/26/18  2:30 PM  Result Value Ref Range   Sodium, Ur 74 mmol/L    Comment: Performed at [facility], [place] [address], [place], [place] [address]   Ct Head Wo Contrast  Result Date: 04/26/2018 CLINICAL DATA:  Altered mental status.  Cough for 3 weeks. EXAM: CT HEAD WITHOUT CONTRAST TECHNIQUE: Contiguous axial images were obtained from the base of the skull through the vertex without intravenous contrast. COMPARISON:  None. FINDINGS: Brain: No evidence of acute infarction, hemorrhage, hydrocephalus, extra-axial collection or mass lesion/mass effect. Mild atrophy and chronic microvascular ischemic change noted. Vascular: No hyperdense vessel or unexpected calcification. Skull: Negative. Sinuses/Orbits: Negative. Other: None. IMPRESSION: No acute abnormality. Mild atrophy and chronic microvascular ischemic change. Electronically Signed   By: Inge Rise M.D.   On: 04/26/2018 13:41   Ct Chest Wo Contrast  Result Date: 04/26/2018 CLINICAL DATA:  Cough. EXAM: CT CHEST WITHOUT CONTRAST TECHNIQUE: Multidetector CT imaging of the chest was performed following the standard protocol without IV contrast. COMPARISON:  Radiograph of same day. FINDINGS: Cardiovascular: Atherosclerosis of thoracic aorta is noted without aneurysm formation. Normal cardiac size. No pericardial effusion. Mediastinum/Nodes: No enlarged mediastinal or axillary lymph nodes. Thyroid gland, trachea, and esophagus demonstrate no significant findings. Lungs/Pleura: No pneumothorax or pleural effusion is noted. Large solitary bulla is noted in left upper lobe measuring 15 x 10 cm. Right lung is clear. No pulmonary consolidation is noted. Upper Abdomen: There appears to be bilateral hydronephrosis present of unknown etiology. No definite calculi are noted. Musculoskeletal: No chest wall mass or suspicious bone lesions identified. IMPRESSION: Large solitary bulla is noted in left upper lobe measuring  15 x 10 cm. No pneumothorax or pulmonary consolidation is noted. Incidental note is made of bilateral hydronephrosis seen in visualized portion of upper abdomen. Further evaluation with renal ultrasound or CT urogram is recommended. Aortic Atherosclerosis (ICD10-I70.0). Electronically Signed   By: Marijo Conception, M.D.   On: 04/26/2018 13:46   Dg Chest Port 1 View  Result Date: 04/26/2018 CLINICAL DATA:  Cough for 2 weeks. EXAM: PORTABLE CHEST 1 VIEW COMPARISON:  03/20/2017 FINDINGS: The cardiomediastinal silhouette is unchanged with borderline to mild cardiomegaly. Aortic atherosclerosis is noted. There is chronic lucency in the left upper hemithorax with a large apical bulla shown on multiple prior studies, however this area is more hyperlucent on today's study now with a suspected pleural line at the level of the aortic knob concerning for a new pneumothorax. Mild scarring and/or subsegmental atelectasis is present in the left mid lung. No pleural effusion is identified. No acute osseous abnormality is seen. IMPRESSION: Chronic lucency in the left upper hemithorax with different appearance on today's study concerning for a pneumothorax superimposed on bullous disease. Chest CT could be considered for further evaluation. Critical Value/emergent results were called by telephone at the time of interpretation on 04/26/2018 at 1:15 pm to Dr. Quintella Reichert , who verbally acknowledged these results. Electronically Signed   By: Logan Bores M.D.   On: 04/26/2018 13:18    Pending Labs Unresulted Labs (From admission, onward)    Start     Ordered   04/27/18 0932  Basic metabolic panel  Daily,   R     04/26/18 1521   04/26/18 1524  Iron and TIBC  Add-on,   R     04/26/18  1523   04/26/18 1235  Urine culture  ONCE - STAT,   STAT     04/26/18 1234          Vitals/Pain Today's Vitals   04/26/18 1210 04/26/18 1211 04/26/18 1609  BP: 124/64  (!) 166/96  Pulse: 95  (!) 116  Resp: 17  15  Temp: 98 F  (36.7 C)    TempSrc: Oral    SpO2: 100%  100%  PainSc:  0-No pain     Isolation Precautions No active isolations  Medications Medications  0.9 %  sodium chloride infusion (Manually program via Guardrails IV Fluids) (has no administration in time range)  sodium bicarbonate 150 mEq in sterile water 1,000 mL infusion (has no administration in time range)  0.9 %  sodium chloride infusion (500 mLs Intravenous New Bag/Given 04/26/18 1554)  cefTRIAXone (ROCEPHIN) 1 g in sodium chloride 0.9 % 100 mL IVPB (1 g Intravenous New Bag/Given 04/26/18 1555)  sodium chloride 0.9 % bolus 1,500 mL (1,500 mLs Intravenous New Bag/Given 04/26/18 1551)    Mobility walks High fall risk   Focused Assessments Renal Assessment Handoff:  Hemodialysis Schedule:  Last Hemodialysis date and time:    Restricted appendage:       R Recommendations: See Admitting Provider Note  Report given to:   Additional Notes:

## 2018-04-26 NOTE — Progress Notes (Signed)
Matthew [name] is a 68 y.o. male Presents today for: CC: Myalgias, feeling hot and cold, frequent urination, low back pain, dysuria.  HPI: Presents with symptoms above.  Has a history of chronic renal failure, stage IV with anemia of chronic kidney disease, previous urinary retention.   Seen in July 2019 by pulmonology, thought dyspnea on exertion secondary to anemia.  Followed by [facility], last office visit from August 2019 reviewed.  Previous urinary retention with bilateral hydronephrosis.  PSA up to 7 when in retention.  Creatinine relatively unchanged at 3.1 after Foley placement with diuresis.  Hydronephrosis had resolved on ultrasound after Foley placement.  Catheter was uncomfortable, he was taught how to perform intermittent catheterization.  Has been treated with finasteride and Flomax.  August 2019 nephrology note reviewed.  Suspected chronic obstruction as primary cause of CKD.  Plan for follow-up after resolution of obstruction for ongoing monitoring.  3 months recommended.  Hemoglobin 9.8 previously, urinalysis in August 2019 trace WBC, trace protein, trace blood.  Bicarb 20 at that time ideally 22 as a goal in CKD.   Today, states he has had hot and cold spells off and for past 3 months. Shaking chills past 2 weeks.  Has not been checking temp at home. Dysuria past month. Feels worse days more discomfort - penile and lower abdomen.  Still doing intermittent urinary catheterization -new catheter each time.  Has not followed up with nephrology or urology since last year.  No black stools, no rectal bleeding.reports remote hx of PUD - years ago.   No recent new cough, or dyspnea.  No known sick contacts. No recent travel in past 2 weeks,  no known exposure to coronavirus infected person.     Patient Active Problem List   Diagnosis Date Noted  . Chronic renal failure, stage 4 (severe) ([place]) 08/01/2017  . Anemia of chronic kidney failure 08/01/2017  . DOE (dyspnea  on exertion) 07/31/2017   No past medical history on file. Past Surgical History:  Procedure Laterality Date  . finger lanced     No Known Allergies Prior to Admission medications   Not on File   Social History   Socioeconomic History  . Marital status: Single    Spouse name: Not on file  . Number of children: Not on file  . Years of education: Not on file  . Highest education level: Not on file  Occupational History  . Not on file  Social Needs  . Financial resource strain: Not on file  . Food insecurity:    Worry: Not on file    Inability: Not on file  . Transportation needs:    Medical: Not on file    Non-medical: Not on file  Tobacco Use  . Smoking status: Former Smoker    Packs/day: 0.25    Years: 1.00    Pack years: 0.25    Types: Cigarettes    Last attempt to quit: 01/27/1997    Years since quitting: 21.2  . Smokeless tobacco: Never Used  Substance and Sexual Activity  . Alcohol use: Yes    Alcohol/week: 10.0 - 12.0 standard drinks    Types: 10 - 12 Cans of beer per week  . Drug use: No  . Sexual activity: Yes  Lifestyle  . Physical activity:    Days per week: Not on file    Minutes per session: Not on file  . Stress: Not on file  Relationships  . Social connections:    Talks  on phone: Not on file    Gets together: Not on file    Attends religious service: Not on file    Active member of club or organization: Not on file    Attends meetings of clubs or organizations: Not on file    Relationship status: Not on file  . Intimate partner violence:    Fear of current or ex partner: Not on file    Emotionally abused: Not on file    Physically abused: Not on file    Forced sexual activity: Not on file  Other Topics Concern  . Not on file  Social History Narrative  . Not on file   ROS: Fatigue, dysuria, chills, R back pain, abd pain. Other per HPI.   Objective: Vitals:   04/26/18 1043  BP: 118/67  Pulse: 95  Resp: 16  Temp: 98.4 F (36.9 C)   TempSrc: Oral  SpO2: 97%  Weight: 149 lb 6.4 oz (67.8 kg)  Height: 5\' 10"  (1.778 m)  Physical Exam: GEN: alert, appears fatigued, responding appropriately to questions.  HEENT: pale oral mucosa, slight white coating on tongue.  Lungs: clear CArdiac: tachycardia, regular.  Abd: ttp suprapubic, negative murphy, nt epigastrium. No rebound/guarding.  MSK: R cvat, nontender on left.  GU: prostate enlarged, ttp. Guarded rectal exam. Min stool in vault.   Results for orders placed or performed in visit on 04/26/18  POCT urinalysis dipstick  Result Value Ref Range   Color, UA other (A) yellow   Clarity, UA turbid (A) clear   Glucose, UA     Bilirubin, UA negative negative   Ketones, POC UA negative negative mg/dL   Spec Grav, UA 1.020 1.010 - 1.025   Blood, UA large (A) negative   pH, UA 5.5 5.0 - 8.0   Protein Ur, POC =100 (A) negative mg/dL   Urobilinogen, UA 0.2 0.2 or 1.0 E.U./dL   Nitrite, UA     Leukocytes, UA Large (3+) (A) Negative  POCT CBC  Result Value Ref Range   WBC 8.9 4.6 - 10.2 K/uL   Lymph, poc 2.7 0.6 - 3.4   POC LYMPH PERCENT 29.8 10 - 50 %L   MID (cbc) 0.4 0 - 0.9   POC MID % 4.5 0 - 12 %M   POC Granulocyte 5.8 2 - 6.9   Granulocyte percent 65.7 37 - 80 %G   RBC 2.59 (A) 4.69 - 6.13 M/uL   Hemoglobin 7.1 (A) 11 - 14.6 g/dL   HCT, POC 21.8 (A) 29 - 41 %   MCV 84.1 76 - 111 fL   MCH, POC 27.4 27 - 31.2 pg   MCHC 32.6 31.8 - 35.4 g/dL   RDW, POC 17.3 %   Platelet Count, POC 449 (A) 142 - 424 K/uL   MPV 7.4 0 - 99.8 fL   Hemoglobin trend: 11.9 March 20, 2017,  9.8 in July 31, 2017.    Over 40 min total, with cart review and assessment/eval of patient.  Assessment/Plan: Urinary tract infection without hematuria, site unspecified  Urinary frequency - Plan: POCT urinalysis dipstick, Urine Microscopic, Urine Culture, POCT CBC, Comprehensive metabolic panel, PSA  Body aches - Plan: POCT CBC, PSA  Acute low back pain with sciatica, sciatica laterality  unspecified, unspecified back pain laterality - Plan: POCT urinalysis dipstick, Urine Microscopic, Urine Culture  Chronic renal failure, stage 4 (severe) ([facility]) - Plan: PSA  Need for prophylactic vaccination against Streptococcus pneumoniae (pneumococcus) - Plan: Pneumococcal conjugate vaccine 13-valent  Anemia, unspecified type - Plan: IFOBT POC (occult bld, rslt in office)  Lower abdominal pain - Plan: IFOBT POC (occult bld, rslt in office)  R[place]ors  Chronic kidney disease, previous urinary retention now on intermittent catheterization.  Has not had follow-up with urology or nephrology.  Relapsing remitting chills, fatigue, now acutely worse past few days with rigors.  Right back pain, suprapubic pain concerning for urosepsis  DRE performed for Hemoccult testing, palpated what appears to be enlarged prostate that was tender, prostatitis possible initial cause of infection.  Additionally has had a decline in hemoglobin now at 7.1.  Previously thought to have component of anemia of chronic disease with chronic kidney disease, but has had a significant decline since early last year.  Hemosure pending  -Concern for possible urosepsis along with worsening anemia, will have evaluated further through emergency room.  Private vehicle.  Signed,   Merri Ray, MD [facility].  04/26/18 11:45 AM

## 2018-04-26 NOTE — Progress Notes (Signed)
Pt need and had an order for 1 RBC, type and screen done, nurse called blood bank and I was told patient need a new hemoglobin lab draw before, I will put the order in and wait, pt is asymptomatic, alert and oriented x 4, will continue to monitor.

## 2018-04-27 ENCOUNTER — Inpatient Hospital Stay (HOSPITAL_COMMUNITY): Payer: Medicare Other

## 2018-04-27 DIAGNOSIS — R278 Other lack of coordination: Secondary | ICD-10-CM | POA: Diagnosis present

## 2018-04-27 DIAGNOSIS — N179 Acute kidney failure, unspecified: Secondary | ICD-10-CM | POA: Diagnosis not present

## 2018-04-27 DIAGNOSIS — N19 Unspecified kidney failure: Secondary | ICD-10-CM

## 2018-04-27 DIAGNOSIS — Z791 Long term (current) use of non-steroidal anti-inflammatories (NSAID): Secondary | ICD-10-CM | POA: Diagnosis not present

## 2018-04-27 DIAGNOSIS — N319 Neuromuscular dysfunction of bladder, unspecified: Secondary | ICD-10-CM | POA: Diagnosis present

## 2018-04-27 DIAGNOSIS — D649 Anemia, unspecified: Secondary | ICD-10-CM | POA: Diagnosis present

## 2018-04-27 DIAGNOSIS — N184 Chronic kidney disease, stage 4 (severe): Secondary | ICD-10-CM | POA: Diagnosis not present

## 2018-04-27 DIAGNOSIS — I129 Hypertensive chronic kidney disease with stage 1 through stage 4 chronic kidney disease, or unspecified chronic kidney disease: Secondary | ICD-10-CM | POA: Diagnosis present

## 2018-04-27 DIAGNOSIS — Z87891 Personal history of nicotine dependence: Secondary | ICD-10-CM | POA: Diagnosis not present

## 2018-04-27 DIAGNOSIS — N189 Chronic kidney disease, unspecified: Secondary | ICD-10-CM | POA: Diagnosis not present

## 2018-04-27 DIAGNOSIS — J439 Emphysema, unspecified: Secondary | ICD-10-CM | POA: Diagnosis present

## 2018-04-27 DIAGNOSIS — D631 Anemia in chronic kidney disease: Secondary | ICD-10-CM | POA: Diagnosis not present

## 2018-04-27 DIAGNOSIS — Z8249 Family history of ischemic heart disease and other diseases of the circulatory system: Secondary | ICD-10-CM | POA: Diagnosis not present

## 2018-04-27 DIAGNOSIS — N289 Disorder of kidney and ureter, unspecified: Secondary | ICD-10-CM | POA: Diagnosis not present

## 2018-04-27 DIAGNOSIS — R296 Repeated falls: Secondary | ICD-10-CM | POA: Diagnosis present

## 2018-04-27 DIAGNOSIS — E872 Acidosis: Secondary | ICD-10-CM | POA: Diagnosis not present

## 2018-04-27 DIAGNOSIS — E1122 Type 2 diabetes mellitus with diabetic chronic kidney disease: Secondary | ICD-10-CM | POA: Diagnosis present

## 2018-04-27 DIAGNOSIS — N136 Pyonephrosis: Secondary | ICD-10-CM | POA: Diagnosis present

## 2018-04-27 DIAGNOSIS — F1010 Alcohol abuse, uncomplicated: Secondary | ICD-10-CM | POA: Diagnosis present

## 2018-04-27 DIAGNOSIS — N390 Urinary tract infection, site not specified: Secondary | ICD-10-CM | POA: Diagnosis not present

## 2018-04-27 DIAGNOSIS — N178 Other acute kidney failure: Secondary | ICD-10-CM | POA: Diagnosis not present

## 2018-04-27 DIAGNOSIS — N1330 Unspecified hydronephrosis: Secondary | ICD-10-CM | POA: Diagnosis not present

## 2018-04-27 LAB — BASIC METABOLIC PANEL
Anion gap: 16 — ABNORMAL HIGH (ref 5–15)
BUN: 139 mg/dL — ABNORMAL HIGH (ref 8–23)
CALCIUM: 7.6 mg/dL — AB (ref 8.9–10.3)
CO2: 13 mmol/L — ABNORMAL LOW (ref 22–32)
Chloride: 107 mmol/L (ref 98–111)
Creatinine, Ser: 13.16 mg/dL — ABNORMAL HIGH (ref 0.61–1.24)
GFR calc Af Amer: 4 mL/min — ABNORMAL LOW (ref >60)
GFR calc non Af Amer: 3 mL/min — ABNORMAL LOW (ref >60)
Glucose, Bld: 65 mg/dL — ABNORMAL LOW (ref 70–99)
Potassium: 4.1 mmol/L (ref 3.5–5.1)
Sodium: 136 mmol/L (ref 135–145)

## 2018-04-27 LAB — TYPE AND SCREEN
ABO/RH(D): A POS
Antibody Screen: NEGATIVE
Unit division: 0

## 2018-04-27 LAB — BPAM RBC
Blood Product Expiration Date: 202004172359
ISSUE DATE / TIME: 202003302326
Unit Type and Rh: 6200

## 2018-04-27 LAB — URINALYSIS, MICROSCOPIC ONLY
CASTS: NONE SEEN /LPF
EPITHELIAL CELLS (NON RENAL): NONE SEEN /HPF (ref 0–10)
RBC, Urine: >30 /hpf — AB (ref 0–2)

## 2018-04-27 LAB — COMPREHENSIVE METABOLIC PANEL
ALK PHOS: 56 IU/L (ref 39–117)
ALT: 5 IU/L (ref 0–44)
AST: 7 IU/L (ref 0–40)
Albumin/Globulin Ratio: 0.7 — ABNORMAL LOW (ref 1.2–2.2)
Albumin: 3.7 g/dL — ABNORMAL LOW (ref 3.8–4.8)
BUN/Creatinine Ratio: 9 — ABNORMAL LOW (ref 10–24)
BUN: 126 mg/dL — AB (ref 8–27)
Bilirubin Total: 0.4 mg/dL (ref 0.0–1.2)
CALCIUM: 8 mg/dL — AB (ref 8.6–10.2)
CHLORIDE: 103 mmol/L (ref 96–106)
CO2: 7 mmol/L — AB (ref 20–29)
Creatinine, Ser: 14.24 mg/dL (ref 0.76–1.27)
GFR calc Af Amer: 4 mL/min/{1.73_m2} — ABNORMAL LOW (ref >59)
GFR calc non Af Amer: 3 mL/min/{1.73_m2} — ABNORMAL LOW (ref >59)
Globulin, Total: 5.6 g/dL — ABNORMAL HIGH (ref 1.5–4.5)
Glucose: 85 mg/dL (ref 65–99)
Potassium: 4.7 mmol/L (ref 3.5–5.2)
Sodium: 134 mmol/L (ref 134–144)
Total Protein: 9.3 g/dL — ABNORMAL HIGH (ref 6.0–8.5)

## 2018-04-27 LAB — URINE CULTURE

## 2018-04-27 LAB — HEMOGLOBIN AND HEMATOCRIT, BLOOD
HEMATOCRIT: 25.7 % — AB (ref 39.0–52.0)
Hemoglobin: 8.1 g/dL — ABNORMAL LOW (ref 13.0–17.0)

## 2018-04-27 LAB — PREPARE RBC (CROSSMATCH)

## 2018-04-27 LAB — PSA: Prostate Specific Ag, Serum: 10.1 ng/mL — ABNORMAL HIGH (ref 0.0–4.0)

## 2018-04-27 MED ORDER — SODIUM BICARBONATE 650 MG PO TABS
650.0000 mg | ORAL_TABLET | Freq: Two times a day (BID) | ORAL | Status: DC
  Administered 2018-04-27 – 2018-05-02 (×11): 650 mg via ORAL
  Filled 2018-04-27 (×11): qty 1

## 2018-04-27 MED ORDER — STERILE WATER FOR INJECTION IV SOLN
INTRAVENOUS | Status: DC
  Administered 2018-04-27 – 2018-04-28 (×3): via INTRAVENOUS
  Filled 2018-04-27 (×13): qty 850

## 2018-04-27 MED ORDER — SODIUM CHLORIDE 0.9 % IV SOLN
INTRAVENOUS | Status: DC
  Administered 2018-04-27: 13:00:00 via INTRAVENOUS

## 2018-04-27 MED ORDER — HEPARIN SODIUM (PORCINE) 5000 UNIT/ML IJ SOLN
5000.0000 [IU] | Freq: Three times a day (TID) | INTRAMUSCULAR | Status: DC
  Administered 2018-04-27 – 2018-05-02 (×14): 5000 [IU] via SUBCUTANEOUS
  Filled 2018-04-27 (×15): qty 1

## 2018-04-27 NOTE — Progress Notes (Signed)
[facility] PROGRESS NOTE    Progress Note  Matthew [name]  KXF:[id number] DOB: March 21, 1950 DOA: 04/26/2018 PCP: Wendie Agreste, MD     Brief Narrative:   Matthew [name] is an 68 y.o. male past medical history significant of urinary retention, stage IV chronic kidney disease, anemia presents with weakness he reports difficulty urinating,  He has been doing self-catheterization for months.  He is taking NSAIDs at home he was referred from her injury care for symptomatic anemia, confusion weakness and falling.  Assessment/Plan:   Acute on chronic renal failure (HCC) with metabolic acidosis/ acute uremia: Started on aggressive IV fluid hydration. Nephrology was consulted recommended conservative management with IV fluids and IV antibiotics.   His thinking is kind of slow and he is positive for asterixis Vascular mapping has been ordered.  Acute Urinary tract infection: He was started empirically on IV Rocephin culture data is pending.  Symptomatic anemia due to chronic renal failure: Likely associated with his chronic renal disease, Hemoccult is negative. It is post 1 unit of packed red blood cells her hemoglobin today is 8.  Alcohol abuse: No signs of withdrawal she was score not been recorded.    DVT prophylaxis: lovenxo Family Communication:none Disposition Plan/Barrier to D/C: unable to determine Code Status:     Code Status Orders  (From admission, onward)         Start     Ordered   04/26/18 1647  Full code  Continuous     04/26/18 1648        Code Status History    This patient has a current code status but no historical code status.        IV Access:    Peripheral IV   Procedures and diagnostic studies:   Ct Head Wo Contrast  Result Date: 04/26/2018 CLINICAL DATA:  Altered mental status.  Cough for 3 weeks. EXAM: CT HEAD WITHOUT CONTRAST TECHNIQUE: Contiguous axial images were obtained from the base of the skull through the vertex  without intravenous contrast. COMPARISON:  None. FINDINGS: Brain: No evidence of acute infarction, hemorrhage, hydrocephalus, extra-axial collection or mass lesion/mass effect. Mild atrophy and chronic microvascular ischemic change noted. Vascular: No hyperdense vessel or unexpected calcification. Skull: Negative. Sinuses/Orbits: Negative. Other: None. IMPRESSION: No acute abnormality. Mild atrophy and chronic microvascular ischemic change. Electronically Signed   By: Inge Rise M.D.   On: 04/26/2018 13:41   Ct Chest Wo Contrast  Result Date: 04/26/2018 CLINICAL DATA:  Cough. EXAM: CT CHEST WITHOUT CONTRAST TECHNIQUE: Multidetector CT imaging of the chest was performed following the standard protocol without IV contrast. COMPARISON:  Radiograph of same day. FINDINGS: Cardiovascular: Atherosclerosis of thoracic aorta is noted without aneurysm formation. Normal cardiac size. No pericardial effusion. Mediastinum/Nodes: No enlarged mediastinal or axillary lymph nodes. Thyroid gland, trachea, and esophagus demonstrate no significant findings. Lungs/Pleura: No pneumothorax or pleural effusion is noted. Large solitary bulla is noted in left upper lobe measuring 15 x 10 cm. Right lung is clear. No pulmonary consolidation is noted. Upper Abdomen: There appears to be bilateral hydronephrosis present of unknown etiology. No definite calculi are noted. Musculoskeletal: No chest wall mass or suspicious bone lesions identified. IMPRESSION: Large solitary bulla is noted in left upper lobe measuring 15 x 10 cm. No pneumothorax or pulmonary consolidation is noted. Incidental note is made of bilateral hydronephrosis seen in visualized portion of upper abdomen. Further evaluation with renal ultrasound or CT urogram is recommended. Aortic Atherosclerosis (ICD10-I70.0). Electronically Signed   By: Jeneen Rinks  Murlean Caller, M.D.   On: 04/26/2018 13:46   Dg Chest Port 1 View  Result Date: 04/26/2018 CLINICAL DATA:  Cough for 2 weeks.  EXAM: PORTABLE CHEST 1 VIEW COMPARISON:  03/20/2017 FINDINGS: The cardiomediastinal silhouette is unchanged with borderline to mild cardiomegaly. Aortic atherosclerosis is noted. There is chronic lucency in the left upper hemithorax with a large apical bulla shown on multiple prior studies, however this area is more hyperlucent on today's study now with a suspected pleural line at the level of the aortic knob concerning for a new pneumothorax. Mild scarring and/or subsegmental atelectasis is present in the left mid lung. No pleural effusion is identified. No acute osseous abnormality is seen. IMPRESSION: Chronic lucency in the left upper hemithorax with different appearance on today's study concerning for a pneumothorax superimposed on bullous disease. Chest CT could be considered for further evaluation. Critical Value/emergent results were called by telephone at the time of interpretation on 04/26/2018 at 1:15 pm to Dr. Quintella Reichert , who verbally acknowledged these results. Electronically Signed   By: Logan Bores M.D.   On: 04/26/2018 13:18     Medical Consultants:    None.  Anti-Infectives:   IV Rocephin  Subjective:    Matthew [name] no new complaints but he cannot recall who is the person of the night he states is.  Objective:    Vitals:   04/26/18 2334 04/26/18 2357 04/27/18 0226 04/27/18 0627  BP: 131/70 130/80 123/65 123/70  Pulse: 87 86 76 91  Resp:  18 16 18   Temp: 98.3 F (36.8 C) 98 F (36.7 C) (!) 97.3 F (36.3 C) 98.5 F (36.9 C)  TempSrc: Oral Oral Oral Oral  SpO2: 100% 100% 100% 100%    Intake/Output Summary (Last 24 hours) at 04/27/2018 0848 Last data filed at 04/27/2018 0841 Gross per 24 hour  Intake 756.67 ml  Output 1600 ml  Net -843.33 ml   There were no vitals filed for this visit.  Exam: General exam: In no acute distress. Respiratory system: Good air movement and clear to auscultation. Cardiovascular system: S1 & S2 heard, RRR. No JVD, murmurs,  rubs, gallops or clicks.  Gastrointestinal system: Abdomen is nondistended, soft and nontender.  Central nervous system: Awake alert and oriented x3 with no focal deficits, he is does have asterixis. Extremities: No pedal edema. Skin: No rashes, lesions or ulcers   Data Reviewed:    Labs: Basic Metabolic Panel: Recent Labs  Lab 04/26/18 1214 04/26/18 1253  NA 134 133*  K 4.7 4.5  CL 103 109  CO2 7* 9*  GLUCOSE 85 97  BUN 126* 140*  CREATININE 14.24* 14.11*  CALCIUM 8.0* 8.1*   GFR Estimated Creatinine Clearance: 4.9 mL/min (A) (by C-G formula based on SCr of 14.11 mg/dL (H)). Liver Function Tests: Recent Labs  Lab 04/26/18 1214 04/26/18 1253  AST 7 9*  ALT 5 6  ALKPHOS 56 51  BILITOT 0.4 0.4  PROT 9.3* 10.5*  ALBUMIN 3.7* 3.1*   No results for input(s): LIPASE, AMYLASE in the last 168 hours. No results for input(s): AMMONIA in the last 168 hours. Coagulation profile No results for input(s): INR, PROTIME in the last 168 hours.  CBC: Recent Labs  Lab 04/26/18 1047 04/26/18 1253 04/26/18 2258 04/27/18 0700  WBC 8.9 9.5 9.5  --   NEUTROABS  --  6.6  --   --   HGB 7.1* 6.6* 6.4* 8.1*  HCT 21.8* 21.1* 20.6* 25.7*  MCV 84.1 90.2 86.6  --  PLT  --  461* 365  --    Cardiac Enzymes: No results for input(s): CKTOTAL, CKMB, CKMBINDEX, TROPONINI in the last 168 hours. BNP (last 3 results) Recent Labs    07/31/17 1540  PROBNP 93.0   CBG: No results for input(s): GLUCAP in the last 168 hours. D-Dimer: No results for input(s): DDIMER in the last 72 hours. Hgb A1c: No results for input(s): HGBA1C in the last 72 hours. Lipid Profile: No results for input(s): CHOL, HDL, LDLCALC, TRIG, CHOLHDL, LDLDIRECT in the last 72 hours. Thyroid function studies: No results for input(s): TSH, T4TOTAL, T3FREE, THYROIDAB in the last 72 hours.  Invalid input(s): FREET3 Anemia work up: Recent Labs    04/26/18 1253  TIBC 213*  IRON 52   Sepsis Labs: Recent Labs  Lab  04/26/18 1047 04/26/18 1253 04/26/18 2258  WBC 8.9 9.5 9.5   Microbiology No results found for this or any previous visit (from the past 240 hour(s)).   Medications:    Continuous Infusions:  sodium chloride 500 mL (04/26/18 1554)   cefTRIAXone (ROCEPHIN)  IV      sodium bicarbonate (isotonic) infusion in sterile water 100 mL/hr at 04/27/18 0740      LOS: 1 day   Charlynne Cousins  [facility]  04/27/2018, 8:48 AM

## 2018-04-27 NOTE — Progress Notes (Signed)
[facility] Progress Note  Subjective: UOP down, creat minimally better to 13, pt feeling better, still not remembering specific things but is Ox 3.   Vitals:   04/27/18 0226 04/27/18 0627 04/27/18 0926 04/27/18 1724  BP: 123/65 123/70 115/62 109/70  Pulse: 76 91 97 82  Resp: 16 18 18 20   Temp: (!) 97.3 F (36.3 C) 98.5 F (36.9 C) 99 F (37.2 C) 98.8 F (37.1 C)  TempSrc: Oral Oral Oral Oral  SpO2: 100% 100% 100% 100%    Inpatient medications: . heparin injection (subcutaneous)  5,000 Units Subcutaneous Q8H  . sodium bicarbonate  650 mg Oral BID   . sodium chloride 500 mL (04/26/18 1554)  . sodium chloride 75 mL/hr at 04/27/18 1255  . cefTRIAXone (ROCEPHIN)  IV 1 g (04/27/18 1133)   sodium chloride, acetaminophen **OR** acetaminophen, calcium carbonate (dosed in mg elemental calcium), camphor-menthol **AND** hydrOXYzine, docusate sodium, feeding supplement (NEPRO CARB STEADY), ondansetron **OR** ondansetron (ZOFRAN) IV, sorbitol, zolpidem  Iron/TIBC/Ferritin/ %Sat    Component Value Date/Time   IRON 52 04/26/2018 1253   TIBC 213 (L) 04/26/2018 1253   IRONPCTSAT 24 04/26/2018 1253    Exam: Gen thin AAM, looks sig better today, eating sandwich, O x3 No jvd or bruits Chest clear bilat to bases RRR no mrg Abd soft ntnd no mass or ascites +bs GU normal male, foley placed draining milky white liquid Ext no LE or UE edema Neuro is alert, Ox 3 , nf, gen'd weak and some confusion    Home meds:  - ibuprofen 800 qid prn   UA - no rbcs > 50 wbc ++bact, 100 prot   Assessment: 1. Renal failure - acute CKD IV, +urine retention w/ bilat hydro by CT chest and purulent UTI. Hx of self-cath at home. Not hypotensive. +taking nsaids at home.  MS is better and looks better. Creat still high 13 today, good UOP w/ foley. Continue supportive care w/ IVF's and IV abx. Baseline creat was 3.0 in July 2019. Would not do HD yet.  2. Anemia - due to CKD +/- other.  Transfuse per primary. Check fe/ tibc.  3. Metabolic acidosis - cont bicarb drip 4. Pyuria - 5. Urinary tract obstruction/ UTI - get renal US, will need urology input. Abx per primary 6. ETOH - per primary    [facility] 04/27/2018, 5:32 PM  Recent Labs  Lab 04/26/18 1214 04/26/18 1253 04/27/18 0911  NA 134 133* 136  K 4.7 4.5 4.1  CL 103 109 107  CO2 7* 9* 13*  GLUCOSE 85 97 65*  BUN 126* 140* 139*  CREATININE 14.24* 14.11* 13.16*  CALCIUM 8.0* 8.1* 7.6*  ALBUMIN 3.7* 3.1*  --    Recent Labs  Lab 04/26/18 1214 04/26/18 1253  AST 7 9*  ALT 5 6  ALKPHOS 56 51  BILITOT 0.4 0.4  PROT 9.3* 10.5*   Recent Labs  Lab 04/26/18 1253 04/26/18 2258 04/27/18 0700  WBC 9.5 9.5  --   NEUTROABS 6.6  --   --   HGB 6.6* 6.4* 8.1*  HCT 21.1* 20.6* 25.7*  MCV 90.2 86.6  --   PLT 461* 365  --

## 2018-04-28 DIAGNOSIS — D631 Anemia in chronic kidney disease: Secondary | ICD-10-CM

## 2018-04-28 DIAGNOSIS — N189 Chronic kidney disease, unspecified: Secondary | ICD-10-CM

## 2018-04-28 DIAGNOSIS — N289 Disorder of kidney and ureter, unspecified: Secondary | ICD-10-CM

## 2018-04-28 LAB — BASIC METABOLIC PANEL
Anion gap: 14 (ref 5–15)
BUN: 126 mg/dL — ABNORMAL HIGH (ref 8–23)
CO2: 16 mmol/L — ABNORMAL LOW (ref 22–32)
Calcium: 6.8 mg/dL — ABNORMAL LOW (ref 8.9–10.3)
Chloride: 106 mmol/L (ref 98–111)
Creatinine, Ser: 11.8 mg/dL — ABNORMAL HIGH (ref 0.61–1.24)
GFR calc Af Amer: 5 mL/min — ABNORMAL LOW (ref >60)
GFR, EST NON AFRICAN AMERICAN: 4 mL/min — AB (ref >60)
Glucose, Bld: 141 mg/dL — ABNORMAL HIGH (ref 70–99)
Potassium: 3.1 mmol/L — ABNORMAL LOW (ref 3.5–5.1)
Sodium: 136 mmol/L (ref 135–145)

## 2018-04-28 LAB — URINE CULTURE: Culture: >=100000 — AB

## 2018-04-28 MED ORDER — POTASSIUM CHLORIDE CRYS ER 20 MEQ PO TBCR
30.0000 meq | EXTENDED_RELEASE_TABLET | Freq: Once | ORAL | Status: AC
  Administered 2018-04-28: 30 meq via ORAL
  Filled 2018-04-28: qty 1

## 2018-04-28 MED ORDER — SODIUM CHLORIDE 0.9 % IV SOLN
510.0000 mg | Freq: Once | INTRAVENOUS | Status: AC
  Administered 2018-04-28: 510 mg via INTRAVENOUS
  Filled 2018-04-28: qty 17

## 2018-04-28 NOTE — Progress Notes (Signed)
Subjective: Interval History: has no complaint , feels some better, appetite.  Objective: Vital signs in last 24 hours: Temp:  [97.6 F (36.4 C)-98.8 F (37.1 C)] 97.6 F (36.4 C) (04/01 0926) Pulse Rate:  [66-82] 72 (04/01 0926) Resp:  [18-20] 18 (04/01 0926) BP: (109-115)/(63-72) 115/72 (04/01 0926) SpO2:  [100 %] 100 % (04/01 0926) Weight:  [70.3 kg] 70.3 kg (04/01 0501) Weight change:   Intake/Output from previous day: 03/31 0701 - 04/01 0700 In: 900 [P.O.:300; I.V.:600] Out: 2301 [Urine:2300; Stool:1] Intake/Output this shift: No intake/output data recorded.  General appearance: cooperative, no distress and slowed mentation Resp: diminished breath sounds bilaterally Cardio: S1, S2 normal and systolic murmur: systolic ejection 2/6, crescendo and decrescendo at 2nd left intercostal space GI: soft, pos bs, nontender Extremities: extremities normal, atraumatic, no cyanosis or edema  Lab Results: Recent Labs    04/26/18 1253 04/26/18 2258 04/27/18 0700  WBC 9.5 9.5  --   HGB 6.6* 6.4* 8.1*  HCT 21.1* 20.6* 25.7*  PLT 461* 365  --    BMET:  Recent Labs    04/27/18 0911 04/28/18 0346  NA 136 136  K 4.1 3.1*  CL 107 106  CO2 13* 16*  GLUCOSE 65* 141*  BUN 139* 126*  CREATININE 13.16* 11.80*  CALCIUM 7.6* 6.8*   No results for input(s): PTH in the last 72 hours. Iron Studies:  Recent Labs    04/26/18 1253  IRON 52  TIBC 213*    Studies/Results: Ct Head Wo Contrast  Result Date: 04/26/2018 CLINICAL DATA:  Altered mental status.  Cough for 3 weeks. EXAM: CT HEAD WITHOUT CONTRAST TECHNIQUE: Contiguous axial images were obtained from the base of the skull through the vertex without intravenous contrast. COMPARISON:  None. FINDINGS: Brain: No evidence of acute infarction, hemorrhage, hydrocephalus, extra-axial collection or mass lesion/mass effect. Mild atrophy and chronic microvascular ischemic change noted. Vascular: No hyperdense vessel or unexpected  calcification. Skull: Negative. Sinuses/Orbits: Negative. Other: None. IMPRESSION: No acute abnormality. Mild atrophy and chronic microvascular ischemic change. Electronically Signed   By: Inge Rise M.D.   On: 04/26/2018 13:41   Ct Chest Wo Contrast  Result Date: 04/26/2018 CLINICAL DATA:  Cough. EXAM: CT CHEST WITHOUT CONTRAST TECHNIQUE: Multidetector CT imaging of the chest was performed following the standard protocol without IV contrast. COMPARISON:  Radiograph of same day. FINDINGS: Cardiovascular: Atherosclerosis of thoracic aorta is noted without aneurysm formation. Normal cardiac size. No pericardial effusion. Mediastinum/Nodes: No enlarged mediastinal or axillary lymph nodes. Thyroid gland, trachea, and esophagus demonstrate no significant findings. Lungs/Pleura: No pneumothorax or pleural effusion is noted. Large solitary bulla is noted in left upper lobe measuring 15 x 10 cm. Right lung is clear. No pulmonary consolidation is noted. Upper Abdomen: There appears to be bilateral hydronephrosis present of unknown etiology. No definite calculi are noted. Musculoskeletal: No chest wall mass or suspicious bone lesions identified. IMPRESSION: Large solitary bulla is noted in left upper lobe measuring 15 x 10 cm. No pneumothorax or pulmonary consolidation is noted. Incidental note is made of bilateral hydronephrosis seen in visualized portion of upper abdomen. Further evaluation with renal ultrasound or CT urogram is recommended. Aortic Atherosclerosis (ICD10-I70.0). Electronically Signed   By: Marijo Conception, M.D.   On: 04/26/2018 13:46   US Renal  Result Date: 04/27/2018 CLINICAL DATA:  Acute on chronic renal insufficiency. EXAM: RENAL / URINARY TRACT ULTRASOUND COMPLETE COMPARISON:  None. FINDINGS: Right Kidney: Renal measurements: 11.0 x 5.1 x 3.7 cm = volume: 108  mL. Increased echogenicity. Moderate to severe hydronephrosis. No mass visualized. Left Kidney: Renal measurements: 12.5 x 6.7 x  4.6 cm = volume: 203 mL. Increased echogenicity. Moderate hydronephrosis. No mass visualized. Bladder: Decompressed by Foley catheter. Bladder wall thickening may be related to underdistention. IMPRESSION: 1. Moderate to severe right and moderate left hydronephrosis. 2. Bilateral increased renal cortical echogenicity, consistent with medical renal disease. Electronically Signed   By: Titus Dubin M.D.   On: 04/27/2018 12:51   Dg Chest Port 1 View  Result Date: 04/26/2018 CLINICAL DATA:  Cough for 2 weeks. EXAM: PORTABLE CHEST 1 VIEW COMPARISON:  03/20/2017 FINDINGS: The cardiomediastinal silhouette is unchanged with borderline to mild cardiomegaly. Aortic atherosclerosis is noted. There is chronic lucency in the left upper hemithorax with a large apical bulla shown on multiple prior studies, however this area is more hyperlucent on today's study now with a suspected pleural line at the level of the aortic knob concerning for a new pneumothorax. Mild scarring and/or subsegmental atelectasis is present in the left mid lung. No pleural effusion is identified. No acute osseous abnormality is seen. IMPRESSION: Chronic lucency in the left upper hemithorax with different appearance on today's study concerning for a pneumothorax superimposed on bullous disease. Chest CT could be considered for further evaluation. Critical Value/emergent results were called by telephone at the time of interpretation on 04/26/2018 at 1:15 pm to Dr. Quintella Reichert , who verbally acknowledged these results. Electronically Signed   By: Logan Bores M.D.   On: 04/26/2018 13:18    I have reviewed the patient's current medications.  Assessment/Plan: 1 AKI obstruction, not doing ISC, needs foley, improving.  Acid/base better 2 CKD ??level 4-5 ? How much will improve  3 Anemia check Fe will need ESA 4 HPTH check 5 neurogenic bladder 6 UTI AB 7? MS P foley, iv Na bicarb,po bicarb, check Fe, PTH   LOS: 2 days   Jeneen Rinks  Roney Youtz 04/28/2018,9:27 AM

## 2018-04-28 NOTE — Progress Notes (Signed)
PROGRESS NOTE  Jasmeet Gehl HEN:[id number] DOB: 1950-02-03 DOA: 04/26/2018 PCP: Wendie Agreste, MD   LOS: 2 days   Brief Narrative / Interim history: Matthew [name] is an 68 y.o. male past medical history significant of urinary retention, stage IV chronic kidney disease, anemia presents with weakness he reports difficulty urinating,  He has been doing self-catheterization for months.  He is taking NSAIDs at home he was referred from her injury care for symptomatic anemia, confusion weakness and falling.  Subjective: -Feels well this morning, seems slightly confused but has no acute complaints.  Denies any chest pain, no shortness of breath  Assessment & Plan: Principal Problem:   Acute on chronic renal failure (HCC) Active Problems:   Anemia, chronic renal failure, stage 4 (severe) (HCC)   Alcohol abuse   Symptomatic anemia   Metabolic acidosis   Acute lower UTI   Acute uremia   AKI (acute kidney injury) ([place])   Principal Problem Acute kidney injury on chronic kidney disease stage IV, metabolic acidosis -Nephrology consulted, patient was placed on IV fluids, creatinine with slight improvement but still significantly elevated -Vascular mapping has been ordered  Active Problems Acute UTI -Continue IV Rocephin, culture data still pending  Symptomatic anemia due to chronic renal failure -Hemoccult negative, patient received 1 unit of packed red blood cells with appropriate increase in his hemoglobin.  Continue to monitor.  Alcohol use -Patient denies drinking daily, he used to drink a lot more but cut back down on just few days a week significantly.  No apparent withdrawal symptoms  Scheduled Meds:  heparin injection (subcutaneous)  5,000 Units Subcutaneous Q8H   sodium bicarbonate  650 mg Oral BID   Continuous Infusions:  cefTRIAXone (ROCEPHIN)  IV 1 g (04/27/18 1133)    sodium bicarbonate (isotonic) infusion in sterile water 75 mL/hr at 04/27/18 1747   PRN  Meds:.acetaminophen **OR** acetaminophen, calcium carbonate (dosed in mg elemental calcium), camphor-menthol **AND** hydrOXYzine, docusate sodium, feeding supplement (NEPRO CARB STEADY), ondansetron **OR** ondansetron (ZOFRAN) IV, sorbitol, zolpidem  DVT prophylaxis: heparin Code Status: Full code Family Communication: no family at bedside Disposition Plan: home when ready   Consultants:   Nephrology   Procedures:   None   Antimicrobials:  Ceftriaxone    Objective: Vitals:   04/27/18 0926 04/27/18 1724 04/27/18 2017 04/28/18 0501  BP: 115/62 109/70 112/69 109/63  Pulse: 97 82 74 66  Resp: 18 20 18 18   Temp: 99 F (37.2 C) 98.8 F (37.1 C) 98.4 F (36.9 C) 97.7 F (36.5 C)  TempSrc: Oral Oral Oral Oral  SpO2: 100% 100% 100% 100%  Weight:    70.3 kg    Intake/Output Summary (Last 24 hours) at 04/28/2018 0815 Last data filed at 04/28/2018 0501 Gross per 24 hour  Intake 900 ml  Output 2301 ml  Net -1401 ml   Filed Weights   04/28/18 0501  Weight: 70.3 kg    Examination:  Constitutional: NAD Eyes: PERRL, lids and conjunctivae normal ENMT: Mucous membranes are moist.  Respiratory: clear to auscultation bilaterally, no wheezing, no crackles. Cardiovascular: Regular rate and rhythm, no murmurs / rubs / gallops. Abdomen: no tenderness. Bowel sounds positive.  Musculoskeletal: no clubbing / cyanosis.  Skin: no rashes  Neurologic: CN 2-12 grossly intact. Strength 5/5 in all 4.    Data Reviewed: I have independently reviewed following labs and imaging studies   CBC: Recent Labs  Lab 04/26/18 1047 04/26/18 1253 04/26/18 2258 04/27/18 0700  WBC 8.9 9.5 9.5  --  NEUTROABS  --  6.6  --   --   HGB 7.1* 6.6* 6.4* 8.1*  HCT 21.8* 21.1* 20.6* 25.7*  MCV 84.1 90.2 86.6  --   PLT  --  461* 365  --    Basic Metabolic Panel: Recent Labs  Lab 04/26/18 1214 04/26/18 1253 04/27/18 0911 04/28/18 0346  NA 134 133* 136 136  K 4.7 4.5 4.1 3.1*  CL 103 109 107 106    CO2 7* 9* 13* 16*  GLUCOSE 85 97 65* 141*  BUN 126* 140* 139* 126*  CREATININE 14.24* 14.11* 13.16* 11.80*  CALCIUM 8.0* 8.1* 7.6* 6.8*   GFR: Estimated Creatinine Clearance: 6 mL/min (A) (by C-G formula based on SCr of 11.8 mg/dL (H)). Liver Function Tests: Recent Labs  Lab 04/26/18 1214 04/26/18 1253  AST 7 9*  ALT 5 6  ALKPHOS 56 51  BILITOT 0.4 0.4  PROT 9.3* 10.5*  ALBUMIN 3.7* 3.1*   No results for input(s): LIPASE, AMYLASE in the last 168 hours. No results for input(s): AMMONIA in the last 168 hours. Coagulation Profile: No results for input(s): INR, PROTIME in the last 168 hours. Cardiac Enzymes: No results for input(s): CKTOTAL, CKMB, CKMBINDEX, TROPONINI in the last 168 hours. BNP (last 3 results) Recent Labs    07/31/17 1540  PROBNP 93.0   HbA1C: No results for input(s): HGBA1C in the last 72 hours. CBG: No results for input(s): GLUCAP in the last 168 hours. Lipid Profile: No results for input(s): CHOL, HDL, LDLCALC, TRIG, CHOLHDL, LDLDIRECT in the last 72 hours. Thyroid Function Tests: No results for input(s): TSH, T4TOTAL, FREET4, T3FREE, THYROIDAB in the last 72 hours. Anemia Panel: Recent Labs    04/26/18 1253  TIBC 213*  IRON 52   Urine analysis:    Component Value Date/Time   COLORURINE YELLOW 04/26/2018 1430   APPEARANCEUR CLOUDY (A) 04/26/2018 1430   LABSPEC 1.009 04/26/2018 1430   PHURINE 6.0 04/26/2018 1430   GLUCOSEU NEGATIVE 04/26/2018 1430   HGBUR MODERATE (A) 04/26/2018 1430   BILIRUBINUR NEGATIVE 04/26/2018 1430   BILIRUBINUR negative 04/26/2018 1049   KETONESUR NEGATIVE 04/26/2018 1430   PROTEINUR 100 (A) 04/26/2018 1430   UROBILINOGEN 0.2 04/26/2018 1049   NITRITE NEGATIVE 04/26/2018 1430   LEUKOCYTESUR LARGE (A) 04/26/2018 1430   Sepsis Labs: Invalid input(s): PROCALCITONIN, LACTICIDVEN  Recent Results (from the past 240 hour(s))  Urine Culture     Status: Abnormal   Collection Time: 04/26/18 11:34 AM  Result Value  Ref Range Status   Urine Culture, Routine Final report (A)  Final   Organism ID, Bacteria Comment (A)  Final    Comment: Beta hemolytic Streptococcus, group B Greater than 100,000 colony forming units per mL Penicillin and ampicillin are drugs of choice for treatment of beta-hemolytic streptococcal infections. Susceptibility testing of penicillins and other beta-lactam agents approved by the FDA for treatment of beta-hemolytic streptococcal infections need not be performed routinely because nonsusceptible isolates are extremely rare in any beta-hemolytic streptococcus and have not been reported for Streptococcus pyogenes (group A). (CLSI)   Urine culture     Status: None (Preliminary result)   Collection Time: 04/26/18  2:30 PM  Result Value Ref Range Status   Specimen Description   Final    URINE, CLEAN CATCH Performed at [facility], [place] [address] [place], [place] [address]    Special Requests   Final    NONE Performed at [facility], [place] [place], [place]  [address]    Culture   Final    CULTURE REINCUBATED FOR BETTER GROWTH Performed at [facility], [place] [address], [place], [place] [address]    Report Status PENDING  Incomplete      Radiology Studies: Ct Head Wo Contrast  Result Date: 04/26/2018 CLINICAL DATA:  Altered mental status.  Cough for 3 weeks. EXAM: CT HEAD WITHOUT CONTRAST TECHNIQUE: Contiguous axial images were obtained from the base of the skull through the vertex without intravenous contrast. COMPARISON:  None. FINDINGS: Brain: No evidence of acute infarction, hemorrhage, hydrocephalus, extra-axial collection or mass lesion/mass effect. Mild atrophy and chronic microvascular ischemic change noted. Vascular: No hyperdense vessel or unexpected calcification. Skull: Negative. Sinuses/Orbits: Negative. Other: None. IMPRESSION: No acute abnormality. Mild atrophy and chronic microvascular ischemic change.  Electronically Signed   By: Inge Rise M.D.   On: 04/26/2018 13:41   Ct Chest Wo Contrast  Result Date: 04/26/2018 CLINICAL DATA:  Cough. EXAM: CT CHEST WITHOUT CONTRAST TECHNIQUE: Multidetector CT imaging of the chest was performed following the standard protocol without IV contrast. COMPARISON:  Radiograph of same day. FINDINGS: Cardiovascular: Atherosclerosis of thoracic aorta is noted without aneurysm formation. Normal cardiac size. No pericardial effusion. Mediastinum/Nodes: No enlarged mediastinal or axillary lymph nodes. Thyroid gland, trachea, and esophagus demonstrate no significant findings. Lungs/Pleura: No pneumothorax or pleural effusion is noted. Large solitary bulla is noted in left upper lobe measuring 15 x 10 cm. Right lung is clear. No pulmonary consolidation is noted. Upper Abdomen: There appears to be bilateral hydronephrosis present of unknown etiology. No definite calculi are noted. Musculoskeletal: No chest wall mass or suspicious bone lesions identified. IMPRESSION: Large solitary bulla is noted in left upper lobe measuring 15 x 10 cm. No pneumothorax or pulmonary consolidation is noted. Incidental note is made of bilateral hydronephrosis seen in visualized portion of upper abdomen. Further evaluation with renal ultrasound or CT urogram is recommended. Aortic Atherosclerosis (ICD10-I70.0). Electronically Signed   By: Marijo Conception, M.D.   On: 04/26/2018 13:46   US Renal  Result Date: 04/27/2018 CLINICAL DATA:  Acute on chronic renal insufficiency. EXAM: RENAL / URINARY TRACT ULTRASOUND COMPLETE COMPARISON:  None. FINDINGS: Right Kidney: Renal measurements: 11.0 x 5.1 x 3.7 cm = volume: 108 mL. Increased echogenicity. Moderate to severe hydronephrosis. No mass visualized. Left Kidney: Renal measurements: 12.5 x 6.7 x 4.6 cm = volume: 203 mL. Increased echogenicity. Moderate hydronephrosis. No mass visualized. Bladder: Decompressed by Foley catheter. Bladder wall thickening may  be related to underdistention. IMPRESSION: 1. Moderate to severe right and moderate left hydronephrosis. 2. Bilateral increased renal cortical echogenicity, consistent with medical renal disease. Electronically Signed   By: Titus Dubin M.D.   On: 04/27/2018 12:51   Dg Chest Port 1 View  Result Date: 04/26/2018 CLINICAL DATA:  Cough for 2 weeks. EXAM: PORTABLE CHEST 1 VIEW COMPARISON:  03/20/2017 FINDINGS: The cardiomediastinal silhouette is unchanged with borderline to mild cardiomegaly. Aortic atherosclerosis is noted. There is chronic lucency in the left upper hemithorax with a large apical bulla shown on multiple prior studies, however this area is more hyperlucent on today's study now with a suspected pleural line at the level of the aortic knob concerning for a new pneumothorax. Mild scarring and/or subsegmental atelectasis is present in the left mid lung. No pleural effusion is identified. No acute osseous abnormality is seen. IMPRESSION: Chronic lucency in the left upper hemithorax with different appearance on today's study concerning for a pneumothorax superimposed on bullous disease.  Chest CT could be considered for further evaluation. Critical Value/emergent results were called by telephone at the time of interpretation on 04/26/2018 at 1:15 pm to Dr. Quintella Reichert , who verbally acknowledged these results. Electronically Signed   By: Logan Bores M.D.   On: 04/26/2018 13:18    Marzetta Board, MD, PhD [facility]  Contact via  [email or web address]  [place] P: [phone]  F: [phone]

## 2018-04-29 LAB — RENAL FUNCTION PANEL
Albumin: 2 g/dL — ABNORMAL LOW (ref 3.5–5.0)
Anion gap: 12 (ref 5–15)
BUN: 111 mg/dL — ABNORMAL HIGH (ref 8–23)
CO2: 24 mmol/L (ref 22–32)
Calcium: 6.2 mg/dL — CL (ref 8.9–10.3)
Chloride: 100 mmol/L (ref 98–111)
Creatinine, Ser: 10.65 mg/dL — ABNORMAL HIGH (ref 0.61–1.24)
GFR calc Af Amer: 5 mL/min — ABNORMAL LOW (ref >60)
GFR calc non Af Amer: 4 mL/min — ABNORMAL LOW (ref >60)
Glucose, Bld: 97 mg/dL (ref 70–99)
Phosphorus: 5.2 mg/dL — ABNORMAL HIGH (ref 2.5–4.6)
Potassium: 3.1 mmol/L — ABNORMAL LOW (ref 3.5–5.1)
Sodium: 136 mmol/L (ref 135–145)

## 2018-04-29 LAB — CBC
HCT: 21.4 % — ABNORMAL LOW (ref 39.0–52.0)
Hemoglobin: 7.1 g/dL — ABNORMAL LOW (ref 13.0–17.0)
MCH: 27.4 pg (ref 26.0–34.0)
MCHC: 33.2 g/dL (ref 30.0–36.0)
MCV: 82.6 fL (ref 80.0–100.0)
Platelets: 328 10*3/uL (ref 150–400)
RBC: 2.59 MIL/uL — ABNORMAL LOW (ref 4.22–5.81)
RDW: 15.8 % — ABNORMAL HIGH (ref 11.5–15.5)
WBC: 6.5 10*3/uL (ref 4.0–10.5)
nRBC: 0 % (ref 0.0–0.2)

## 2018-04-29 LAB — PARATHYROID HORMONE, INTACT (NO CA): PTH: 72 pg/mL — ABNORMAL HIGH (ref 15–65)

## 2018-04-29 MED ORDER — POTASSIUM CHLORIDE CRYS ER 20 MEQ PO TBCR
40.0000 meq | EXTENDED_RELEASE_TABLET | Freq: Once | ORAL | Status: AC
  Administered 2018-04-29: 40 meq via ORAL
  Filled 2018-04-29: qty 2

## 2018-04-29 MED ORDER — AMOXICILLIN-POT CLAVULANATE 875-125 MG PO TABS: 1.0000 | ORAL_TABLET | Freq: Two times a day (BID) | ORAL | Status: DC

## 2018-04-29 MED ORDER — SODIUM CHLORIDE 0.9 % IV SOLN
INTRAVENOUS | Status: DC
  Administered 2018-04-29 – 2018-05-01 (×3): via INTRAVENOUS

## 2018-04-29 MED ORDER — AMOXICILLIN-POT CLAVULANATE 500-125 MG PO TABS
1.0000 | ORAL_TABLET | Freq: Every day | ORAL | Status: DC
  Administered 2018-04-29 – 2018-05-02 (×4): 500 mg via ORAL
  Filled 2018-04-29 (×4): qty 1

## 2018-04-29 NOTE — Progress Notes (Signed)
Calcium-6.2. On call provider made aware. Will continue to monitor.

## 2018-04-29 NOTE — Progress Notes (Signed)
PROGRESS NOTE  Matthew [name] NFA:[id number] DOB: 21-Mar-1950 DOA: 04/26/2018 PCP: Wendie Agreste, MD   LOS: 3 days   Brief Narrative / Interim history: Matthew [name] is an 68 y.o. male past medical history significant of urinary retention, stage IV chronic kidney disease, anemia presents with weakness he reports difficulty urinating,  He has been doing self-catheterization for months.  He is taking NSAIDs at home he was referred from her injury care for symptomatic anemia, confusion weakness and falling.  Subjective: -Much improved, no abdominal pain, no nausea or vomiting.  Eating well.  Assessment & Plan: Principal Problem:   Acute on chronic renal failure (HCC) Active Problems:   Anemia, chronic renal failure, stage 4 (severe) (HCC)   Alcohol abuse   Symptomatic anemia   Metabolic acidosis   Acute lower UTI   Acute uremia   AKI (acute kidney injury) ([place])   Principal Problem Acute kidney injury on chronic kidney disease stage IV, metabolic acidosis -Nephrology consulted, patient was placed on IV fluids, creatinine with slight improvement but still significantly elevated in the 10 range -Likely in the setting of chronic urinary obstruction, patient self catheterizes at home but may not be frequent enough, he tells me that only 2-3 times per day, recommend Foley at home as well and urology follow-up  Active Problems Acute UTI -Continue IV Rocephin, culture data still pending  Symptomatic anemia due to chronic renal failure -Hemoccult negative, patient received 1 unit of packed red blood cells with appropriate increase in his hemoglobin. -7.1 this morning  Alcohol use -Patient denies drinking daily, he used to drink a lot more but cut back down on just few days a week significantly.  No apparent withdrawal symptoms  Scheduled Meds: . amoxicillin-clavulanate  1 tablet Oral Daily  . heparin injection (subcutaneous)  5,000 Units Subcutaneous Q8H  . sodium bicarbonate   650 mg Oral BID   Continuous Infusions: .  sodium bicarbonate (isotonic) infusion in sterile water 75 mL/hr at 04/28/18 2359   PRN Meds:.acetaminophen **OR** acetaminophen, calcium carbonate (dosed in mg elemental calcium), camphor-menthol **AND** hydrOXYzine, docusate sodium, feeding supplement (NEPRO CARB STEADY), ondansetron **OR** ondansetron (ZOFRAN) IV, sorbitol, zolpidem  DVT prophylaxis: heparin Code Status: Full code Family Communication: no family at bedside, will call daughter this pm Disposition Plan: home when ready   Consultants:   Nephrology   Procedures:   None   Antimicrobials:  Ceftriaxone    Objective: Vitals:   04/28/18 2150 04/29/18 0327 04/29/18 0500 04/29/18 0907  BP: 119/73 124/73  121/74  Pulse: 76 70  69  Resp: 18 18  18   Temp: 98.5 F (36.9 C) 98.4 F (36.9 C)  98.3 F (36.8 C)  TempSrc: Oral Oral  Oral  SpO2: 98% 98%  98%  Weight:   70.4 kg     Intake/Output Summary (Last 24 hours) at 04/29/2018 1007 Last data filed at 04/29/2018 0926 Gross per 24 hour  Intake 1965.05 ml  Output 3700 ml  Net -1734.95 ml   Filed Weights   04/28/18 0501 04/29/18 0500  Weight: 70.3 kg 70.4 kg    Examination:  Constitutional: NAD Eyes: no scleral icterus  ENMT: mmm Respiratory: CTA biL, no wheezing  Cardiovascular: RRR, no edema  Abdomen: soft, NT, ND, BS+ Musculoskeletal: no clubbing / cyanosis.  Skin: no rashes seen  Neurologic: non focal, ambulatory    Data Reviewed: I have independently reviewed following labs and imaging studies   CBC: Recent Labs  Lab 04/26/18 1047 04/26/18 1253 04/26/18 2258  04/27/18 0700 04/29/18 0541  WBC 8.9 9.5 9.5  --  6.5  NEUTROABS  --  6.6  --   --   --   HGB 7.1* 6.6* 6.4* 8.1* 7.1*  HCT 21.8* 21.1* 20.6* 25.7* 21.4*  MCV 84.1 90.2 86.6  --  82.6  PLT  --  461* 365  --  361   Basic Metabolic Panel: Recent Labs  Lab 04/26/18 1214 04/26/18 1253 04/27/18 0911 04/28/18 0346 04/29/18 0542  NA 134  133* 136 136 136  K 4.7 4.5 4.1 3.1* 3.1*  CL 103 109 107 106 100  CO2 7* 9* 13* 16* 24  GLUCOSE 85 97 65* 141* 97  BUN 126* 140* 139* 126* 111*  CREATININE 14.24* 14.11* 13.16* 11.80* 10.65*  CALCIUM 8.0* 8.1* 7.6* 6.8* 6.2*  PHOS  --   --   --   --  5.2*   GFR: Estimated Creatinine Clearance: 6.7 mL/min (A) (by C-G formula based on SCr of 10.65 mg/dL (H)). Liver Function Tests: Recent Labs  Lab 04/26/18 1214 04/26/18 1253 04/29/18 0542  AST 7 9*  --   ALT 5 6  --   ALKPHOS 56 51  --   BILITOT 0.4 0.4  --   PROT 9.3* 10.5*  --   ALBUMIN 3.7* 3.1* 2.0*   No results for input(s): LIPASE, AMYLASE in the last 168 hours. No results for input(s): AMMONIA in the last 168 hours. Coagulation Profile: No results for input(s): INR, PROTIME in the last 168 hours. Cardiac Enzymes: No results for input(s): CKTOTAL, CKMB, CKMBINDEX, TROPONINI in the last 168 hours. BNP (last 3 results) Recent Labs    07/31/17 1540  PROBNP 93.0   HbA1C: No results for input(s): HGBA1C in the last 72 hours. CBG: No results for input(s): GLUCAP in the last 168 hours. Lipid Profile: No results for input(s): CHOL, HDL, LDLCALC, TRIG, CHOLHDL, LDLDIRECT in the last 72 hours. Thyroid Function Tests: No results for input(s): TSH, T4TOTAL, FREET4, T3FREE, THYROIDAB in the last 72 hours. Anemia Panel: Recent Labs    04/26/18 1253  TIBC 213*  IRON 52   Urine analysis:    Component Value Date/Time   COLORURINE YELLOW 04/26/2018 1430   APPEARANCEUR CLOUDY (A) 04/26/2018 1430   LABSPEC 1.009 04/26/2018 1430   PHURINE 6.0 04/26/2018 1430   GLUCOSEU NEGATIVE 04/26/2018 1430   HGBUR MODERATE (A) 04/26/2018 1430   BILIRUBINUR NEGATIVE 04/26/2018 1430   BILIRUBINUR negative 04/26/2018 1049   KETONESUR NEGATIVE 04/26/2018 1430   PROTEINUR 100 (A) 04/26/2018 1430   UROBILINOGEN 0.2 04/26/2018 1049   NITRITE NEGATIVE 04/26/2018 1430   LEUKOCYTESUR LARGE (A) 04/26/2018 1430   Sepsis Labs: Invalid  input(s): PROCALCITONIN, LACTICIDVEN  Recent Results (from the past 240 hour(s))  Urine Culture     Status: Abnormal   Collection Time: 04/26/18 11:34 AM  Result Value Ref Range Status   Urine Culture, Routine Final report (A)  Final   Organism ID, Bacteria Comment (A)  Final    Comment: Beta hemolytic Streptococcus, group B Greater than 100,000 colony forming units per mL Penicillin and ampicillin are drugs of choice for treatment of beta-hemolytic streptococcal infections. Susceptibility testing of penicillins and other beta-lactam agents approved by the FDA for treatment of beta-hemolytic streptococcal infections need not be performed routinely because nonsusceptible isolates are extremely rare in any beta-hemolytic streptococcus and have not been reported for Streptococcus pyogenes (group A). (CLSI)   Urine culture     Status: Abnormal   Collection  Time: 04/26/18  2:30 PM  Result Value Ref Range Status   Specimen Description   Final    URINE, CLEAN CATCH Performed at [facility], [place] [address] [place], [place] [address]    Special Requests   Final    NONE Performed at [facility], [place] [address] [place], [place] [address]    Culture (A)  Final    >=100,000 COLONIES/mL GROUP B STREP(S.AGALACTIAE)ISOLATED TESTING AGAINST S. AGALACTIAE NOT ROUTINELY PERFORMED DUE TO PREDICTABILITY OF AMP/PEN/VAN SUSCEPTIBILITY. Performed at [facility], [place] [address] [place], [place] [address]    Report Status 04/28/2018 FINAL  Final      Radiology Studies: US Renal  Result Date: 04/27/2018 CLINICAL DATA:  Acute on chronic renal insufficiency. EXAM: RENAL / URINARY TRACT ULTRASOUND COMPLETE COMPARISON:  None. FINDINGS: Right Kidney: Renal measurements: 11.0 x 5.1 x 3.7 cm = volume: 108 mL. Increased echogenicity. Moderate to severe hydronephrosis. No mass visualized. Left Kidney: Renal measurements: 12.5 x 6.7 x 4.6 cm = volume: 203 mL.  Increased echogenicity. Moderate hydronephrosis. No mass visualized. Bladder: Decompressed by Foley catheter. Bladder wall thickening may be related to underdistention. IMPRESSION: 1. Moderate to severe right and moderate left hydronephrosis. 2. Bilateral increased renal cortical echogenicity, consistent with medical renal disease. Electronically Signed   By: Titus Dubin M.D.   On: 04/27/2018 12:51    Marzetta Board, MD, PhD [facility]  Contact via  [email or web address]  [place] P: [phone]  F: [phone]

## 2018-04-29 NOTE — Care Management Important Message (Signed)
Important Message  Patient Details  Name: Matthew [name] MRN: [id number] Date of Birth: [birth year]   Medicare Important Message Given:  Yes    [name] 04/29/2018, 3:24 PM

## 2018-04-29 NOTE — Progress Notes (Signed)
Subjective: Interval History: has no complaint of N, v, and appetite ok. Had D.  Objective: Vital signs in last 24 hours: Temp:  [98.3 F (36.8 C)-98.5 F (36.9 C)] 98.3 F (36.8 C) (04/02 0907) Pulse Rate:  [69-76] 69 (04/02 0907) Resp:  [18] 18 (04/02 0907) BP: (119-124)/(73-74) 121/74 (04/02 0907) SpO2:  [96 %-98 %] 98 % (04/02 0907) Weight:  [70.4 kg] 70.4 kg (04/02 0500) Weight change: 0.1 kg  Intake/Output from previous day: 04/01 0701 - 04/02 0700 In: 2505.1 [P.O.:880; I.V.:1425.1; IV Piggyback:200] Out: 2900 [Urine:2900] Intake/Output this shift: Total I/O In: 120 [P.O.:120] Out: 800 [Urine:800]  General appearance: alert, cooperative and no distress Resp: clear to auscultation bilaterally Cardio: S1, S2 normal and systolic murmur: holosystolic 2/6, blowing at apex GI: soft, non-tender; bowel sounds normal; no masses,  no organomegaly Extremities: extremities normal, atraumatic, no cyanosis or edema  Lab Results: Recent Labs    04/26/18 2258 04/27/18 0700 04/29/18 0541  WBC 9.5  --  6.5  HGB 6.4* 8.1* 7.1*  HCT 20.6* 25.7* 21.4*  PLT 365  --  328   BMET:  Recent Labs    04/28/18 0346 04/29/18 0542  NA 136 136  K 3.1* 3.1*  CL 106 100  CO2 16* 24  GLUCOSE 141* 97  BUN 126* 111*  CREATININE 11.80* 10.65*  CALCIUM 6.8* 6.2*   Recent Labs    04/28/18 1019  PTH 72*   Iron Studies:  Recent Labs    04/26/18 1253  IRON 52  TIBC 213*    Studies/Results: US Renal  Result Date: 04/27/2018 CLINICAL DATA:  Acute on chronic renal insufficiency. EXAM: RENAL / URINARY TRACT ULTRASOUND COMPLETE COMPARISON:  None. FINDINGS: Right Kidney: Renal measurements: 11.0 x 5.1 x 3.7 cm = volume: 108 mL. Increased echogenicity. Moderate to severe hydronephrosis. No mass visualized. Left Kidney: Renal measurements: 12.5 x 6.7 x 4.6 cm = volume: 203 mL. Increased echogenicity. Moderate hydronephrosis. No mass visualized. Bladder: Decompressed by Foley catheter.  Bladder wall thickening may be related to underdistention. IMPRESSION: 1. Moderate to severe right and moderate left hydronephrosis. 2. Bilateral increased renal cortical echogenicity, consistent with medical renal disease. Electronically Signed   By: Titus Dubin M.D.   On: 04/27/2018 12:51    I have reviewed the patient's current medications.  Assessment/Plan: 1 AKI Cr trending down but slow indic some chronic issues.  Bicarb ok with pos. Will restart if as bps soft,  2 Anemia stable 3 obstructive uropathy not doing SIC ut dict. Needs foley P ivf, follow chem cont foley    LOS: 3 days   Jeneen Rinks Radonna Bracher 04/29/2018,10:31 AM

## 2018-04-30 LAB — TYPE AND SCREEN
ABO/RH(D): A POS
Antibody Screen: NEGATIVE
Unit division: 0

## 2018-04-30 LAB — RENAL FUNCTION PANEL
Albumin: 2 g/dL — ABNORMAL LOW (ref 3.5–5.0)
Anion gap: 15 (ref 5–15)
BUN: 97 mg/dL — ABNORMAL HIGH (ref 8–23)
CO2: 21 mmol/L — ABNORMAL LOW (ref 22–32)
Calcium: 6.1 mg/dL — CL (ref 8.9–10.3)
Chloride: 100 mmol/L (ref 98–111)
Creatinine, Ser: 9.27 mg/dL — ABNORMAL HIGH (ref 0.61–1.24)
GFR calc Af Amer: 6 mL/min — ABNORMAL LOW (ref >60)
GFR calc non Af Amer: 5 mL/min — ABNORMAL LOW (ref >60)
Glucose, Bld: 95 mg/dL (ref 70–99)
Phosphorus: 4.4 mg/dL (ref 2.5–4.6)
Potassium: 3.3 mmol/L — ABNORMAL LOW (ref 3.5–5.1)
Sodium: 136 mmol/L (ref 135–145)

## 2018-04-30 LAB — CBC
HCT: 21 % — ABNORMAL LOW (ref 39.0–52.0)
Hemoglobin: 6.7 g/dL — CL (ref 13.0–17.0)
MCH: 27.5 pg (ref 26.0–34.0)
MCHC: 31.9 g/dL (ref 30.0–36.0)
MCV: 86.1 fL (ref 80.0–100.0)
Platelets: 300 10*3/uL (ref 150–400)
RBC: 2.44 MIL/uL — ABNORMAL LOW (ref 4.22–5.81)
RDW: 15.7 % — ABNORMAL HIGH (ref 11.5–15.5)
WBC: 5.5 10*3/uL (ref 4.0–10.5)
nRBC: 0 % (ref 0.0–0.2)

## 2018-04-30 LAB — BPAM RBC
BLOOD PRODUCT EXPIRATION DATE: 202004142359
Unit Type and Rh: 6200

## 2018-04-30 LAB — PREPARE RBC (CROSSMATCH)

## 2018-04-30 LAB — HEMOGLOBIN AND HEMATOCRIT, BLOOD
HCT: 24.3 % — ABNORMAL LOW (ref 39.0–52.0)
Hemoglobin: 7.9 g/dL — ABNORMAL LOW (ref 13.0–17.0)

## 2018-04-30 MED ORDER — POTASSIUM CHLORIDE CRYS ER 20 MEQ PO TBCR
40.0000 meq | EXTENDED_RELEASE_TABLET | Freq: Once | ORAL | Status: AC
  Administered 2018-04-30: 40 meq via ORAL
  Filled 2018-04-30: qty 2

## 2018-04-30 MED ORDER — NEPRO/CARBSTEADY PO LIQD
237.0000 mL | Freq: Every day | ORAL | Status: DC
  Administered 2018-04-30 – 2018-05-01 (×2): 237 mL via ORAL
  Filled 2018-04-30 (×3): qty 237

## 2018-04-30 MED ORDER — SODIUM CHLORIDE 0.9% IV SOLUTION
Freq: Once | INTRAVENOUS | Status: AC
  Administered 2018-04-30: 11:00:00 via INTRAVENOUS

## 2018-04-30 NOTE — Progress Notes (Signed)
PROGRESS NOTE  Jerrald Doverspike ZYS:[id number] DOB: 07-26-1950 DOA: 04/26/2018 PCP: Wendie Agreste, MD   LOS: 4 days   Brief Narrative / Interim history: Dorion Petillo is an 68 y.o. male past medical history significant of urinary retention, stage IV chronic kidney disease, anemia presents with weakness he reports difficulty urinating,  He has been doing self-catheterization for months.  He is taking NSAIDs at home he was referred from her injury care for symptomatic anemia, confusion weakness and falling.  Subjective: -Does not have any complaints today.  Denies any abdominal pain, no nausea or vomiting.  Denies any shortness of breath.  Assessment & Plan: Principal Problem:   Acute on chronic renal failure (HCC) Active Problems:   Anemia, chronic renal failure, stage 4 (severe) (HCC)   Alcohol abuse   Symptomatic anemia   Metabolic acidosis   Acute lower UTI   Acute uremia   AKI (acute kidney injury) ([place])   Principal Problem Acute kidney injury on chronic kidney disease stage IV, metabolic acidosis -Nephrology consulted, patient was placed on IV fluids -Creatinine slowly improving -Likely in the setting of chronic urinary obstruction, patient self catheterizes at home but may not be frequent enough, he tells me that only 2-3 times per day -Currently has a Foley catheter which he will need to continue at home  Active Problems Acute UTI due to recurrent self-catheterization -Initially placed on IV Rocephin however cultures showed strep, placed on Augmentin, plan to treat as complicated UTI  Symptomatic anemia due to chronic renal failure -Hemoccult negative, transfuse a second unit this morning for hemoglobin less than 7 -No clinical evidence of bleeding  Alcohol use -Patient denies drinking daily, he used to drink a lot more but cut back down on just few days a week significantly.  No apparent withdrawal symptoms  Scheduled Meds: . sodium chloride   Intravenous Once   . amoxicillin-clavulanate  1 tablet Oral Daily  . heparin injection (subcutaneous)  5,000 Units Subcutaneous Q8H  . potassium chloride  40 mEq Oral Once  . sodium bicarbonate  650 mg Oral BID   Continuous Infusions: . sodium chloride 75 mL/hr at 04/29/18 1159  .  sodium bicarbonate (isotonic) infusion in sterile water 75 mL/hr at 04/28/18 2359   PRN Meds:.acetaminophen **OR** acetaminophen, calcium carbonate (dosed in mg elemental calcium), camphor-menthol **AND** hydrOXYzine, docusate sodium, feeding supplement (NEPRO CARB STEADY), ondansetron **OR** ondansetron (ZOFRAN) IV, sorbitol, zolpidem  DVT prophylaxis: heparin Code Status: Full code Family Communication: no family at bedside, attempted to call daughter yesterday and left message, will attempt again today Disposition Plan: home when ready   Consultants:   Nephrology   Procedures:   None   Antimicrobials:  Ceftriaxone    Objective: Vitals:   04/29/18 0907 04/29/18 1654 04/29/18 2141 04/30/18 0446  BP: 121/74 110/72 109/75 113/71  Pulse: 69 88 72 70  Resp: 18 20 20 19   Temp: 98.3 F (36.8 C) 98.1 F (36.7 C) 98.1 F (36.7 C) 98.1 F (36.7 C)  TempSrc: Oral Oral Oral Oral  SpO2: 98% 100% 99% 99%  Weight:   70.1 kg     Intake/Output Summary (Last 24 hours) at 04/30/2018 0826 Last data filed at 04/30/2018 0543 Gross per 24 hour  Intake 2255.8 ml  Output 2500 ml  Net -244.2 ml   Filed Weights   04/28/18 0501 04/29/18 0500 04/29/18 2141  Weight: 70.3 kg 70.4 kg 70.1 kg    Examination:  Constitutional: No distress, comfortable, eating breakfast Eyes: No icterus, lids  and conjunctivae normal ENMT: Moist mucous membranes Respiratory: No wheezing, no crackles, clear to auscultation Cardiovascular: Regular rate and rhythm, no peripheral edema Abdomen: Nontender, nondistended Musculoskeletal: no clubbing / cyanosis.  Skin: No new rashes Neurologic: No focal deficits   Data Reviewed: I have independently  reviewed following labs and imaging studies   CBC: Recent Labs  Lab 04/26/18 1047 04/26/18 1253 04/26/18 2258 04/27/18 0700 04/29/18 0541 04/30/18 0619  WBC 8.9 9.5 9.5  --  6.5 5.5  NEUTROABS  --  6.6  --   --   --   --   HGB 7.1* 6.6* 6.4* 8.1* 7.1* 6.7*  HCT 21.8* 21.1* 20.6* 25.7* 21.4* 21.0*  MCV 84.1 90.2 86.6  --  82.6 86.1  PLT  --  461* 365  --  328 322   Basic Metabolic Panel: Recent Labs  Lab 04/26/18 1253 04/27/18 0911 04/28/18 0346 04/29/18 0542 04/30/18 0619  NA 133* 136 136 136 136  K 4.5 4.1 3.1* 3.1* 3.3*  CL 109 107 106 100 100  CO2 9* 13* 16* 24 21*  GLUCOSE 97 65* 141* 97 95  BUN 140* 139* 126* 111* 97*  CREATININE 14.11* 13.16* 11.80* 10.65* 9.27*  CALCIUM 8.1* 7.6* 6.8* 6.2* 6.1*  PHOS  --   --   --  5.2* 4.4   GFR: Estimated Creatinine Clearance: 7.7 mL/min (A) (by C-G formula based on SCr of 9.27 mg/dL (H)). Liver Function Tests: Recent Labs  Lab 04/26/18 1214 04/26/18 1253 04/29/18 0542 04/30/18 0619  AST 7 9*  --   --   ALT 5 6  --   --   ALKPHOS 56 51  --   --   BILITOT 0.4 0.4  --   --   PROT 9.3* 10.5*  --   --   ALBUMIN 3.7* 3.1* 2.0* 2.0*   No results for input(s): LIPASE, AMYLASE in the last 168 hours. No results for input(s): AMMONIA in the last 168 hours. Coagulation Profile: No results for input(s): INR, PROTIME in the last 168 hours. Cardiac Enzymes: No results for input(s): CKTOTAL, CKMB, CKMBINDEX, TROPONINI in the last 168 hours. BNP (last 3 results) Recent Labs    07/31/17 1540  PROBNP 93.0   HbA1C: No results for input(s): HGBA1C in the last 72 hours. CBG: No results for input(s): GLUCAP in the last 168 hours. Lipid Profile: No results for input(s): CHOL, HDL, LDLCALC, TRIG, CHOLHDL, LDLDIRECT in the last 72 hours. Thyroid Function Tests: No results for input(s): TSH, T4TOTAL, FREET4, T3FREE, THYROIDAB in the last 72 hours. Anemia Panel: No results for input(s): VITAMINB12, FOLATE, FERRITIN, TIBC, IRON,  RETICCTPCT in the last 72 hours. Urine analysis:    Component Value Date/Time   COLORURINE YELLOW 04/26/2018 1430   APPEARANCEUR CLOUDY (A) 04/26/2018 1430   LABSPEC 1.009 04/26/2018 1430   PHURINE 6.0 04/26/2018 1430   GLUCOSEU NEGATIVE 04/26/2018 1430   HGBUR MODERATE (A) 04/26/2018 1430   BILIRUBINUR NEGATIVE 04/26/2018 1430   BILIRUBINUR negative 04/26/2018 1049   KETONESUR NEGATIVE 04/26/2018 1430   PROTEINUR 100 (A) 04/26/2018 1430   UROBILINOGEN 0.2 04/26/2018 1049   NITRITE NEGATIVE 04/26/2018 1430   LEUKOCYTESUR LARGE (A) 04/26/2018 1430   Sepsis Labs: Invalid input(s): PROCALCITONIN, LACTICIDVEN  Recent Results (from the past 240 hour(s))  Urine Culture     Status: Abnormal   Collection Time: 04/26/18 11:34 AM  Result Value Ref Range Status   Urine Culture, Routine Final report (A)  Final   Organism ID,  Bacteria Comment (A)  Final    Comment: Beta hemolytic Streptococcus, group B Greater than 100,000 colony forming units per mL Penicillin and ampicillin are drugs of choice for treatment of beta-hemolytic streptococcal infections. Susceptibility testing of penicillins and other beta-lactam agents approved by the FDA for treatment of beta-hemolytic streptococcal infections need not be performed routinely because nonsusceptible isolates are extremely rare in any beta-hemolytic streptococcus and have not been reported for Streptococcus pyogenes (group A). (CLSI)   Urine culture     Status: Abnormal   Collection Time: 04/26/18  2:30 PM  Result Value Ref Range Status   Specimen Description   Final    URINE, CLEAN CATCH Performed at [facility], [place] [address] [place], [place] [address]    Special Requests   Final    NONE Performed at [facility], [place] [address] [place], [place] [address]    Culture (A)  Final    >=100,000 COLONIES/mL GROUP B STREP(S.AGALACTIAE)ISOLATED TESTING AGAINST S. AGALACTIAE NOT ROUTINELY PERFORMED  DUE TO PREDICTABILITY OF AMP/PEN/VAN SUSCEPTIBILITY. Performed at [facility], [place] [address], [place], [place] [address]    Report Status 04/28/2018 FINAL  Final      Radiology Studies: No results found.  Marzetta Board, MD, PhD [facility]  Contact via  [email or web address]  [place] P: [phone]  F: [phone]

## 2018-04-30 NOTE — Progress Notes (Signed)
Subjective: Interval History: has no complaint , feeling better.  Objective: Vital signs in last 24 hours: Temp:  [98.1 F (36.7 C)] 98.1 F (36.7 C) (04/03 0446) Pulse Rate:  [70-88] 70 (04/03 0446) Resp:  [19-20] 19 (04/03 0446) BP: (109-113)/(71-75) 113/71 (04/03 0446) SpO2:  [99 %-100 %] 99 % (04/03 0446) Weight:  [70.1 kg] 70.1 kg (04/02 2141) Weight change: -0.3 kg  Intake/Output from previous day: 04/02 0701 - 04/03 0700 In: 2255.8 [P.O.:480; I.V.:1775.8] Out: 2500 [Urine:2500] Intake/Output this shift: No intake/output data recorded.  General appearance: alert, cooperative and no distress Resp: clear to auscultation bilaterally Cardio: S1, S2 normal and systolic murmur: systolic ejection 2/6, crescendo and decrescendo at 2nd left intercostal space GI: soft, non-tender; bowel sounds normal; no masses,  no organomegaly Extremities: extremities normal, atraumatic, no cyanosis or edema  Lab Results: Recent Labs    04/29/18 0541 04/30/18 0619  WBC 6.5 5.5  HGB 7.1* 6.7*  HCT 21.4* 21.0*  PLT 328 300   BMET:  Recent Labs    04/29/18 0542 04/30/18 0619  NA 136 136  K 3.1* 3.3*  CL 100 100  CO2 24 21*  GLUCOSE 97 95  BUN 111* 97*  CREATININE 10.65* 9.27*  CALCIUM 6.2* 6.1*   Recent Labs    04/28/18 1019  PTH 72*   Iron Studies: No results for input(s): IRON, TIBC, TRANSFERRIN, FERRITIN in the last 72 hours.  Studies/Results: No results found.  I have reviewed the patient's current medications.  Assessment/Plan: 1 AKI obstructive uropathy, slowly better, mild acidemia, on bicarb. Chronic so will be slowly better 2 Anemia per primary 3 Neurogenic bladder 4 HPTH vit D 5 NONADHERENCE  P foley, ivf, follow chem. D/c in 2-3 d   LOS: 4 days   Jeneen Rinks Sedra Morfin 04/30/2018,9:46 AM

## 2018-04-30 NOTE — Progress Notes (Signed)
Initial Nutrition Assessment   RD working remotely.  DOCUMENTATION CODES:   Not applicable  INTERVENTION:  Provide Nepro Shake po once daily, each supplement provides 425 kcal and 19 grams protein  Encourage adequate PO intake.   NUTRITION DIAGNOSIS:   Increased nutrient needs related to acute illness(AKI on CKD) as evidenced by estimated needs.  GOAL:   Patient will meet greater than or equal to 90% of their needs  MONITOR:   PO intake, Supplement acceptance, Labs, Weight trends, I & O's, Skin  REASON FOR ASSESSMENT:   Malnutrition Screening Tool    ASSESSMENT:   68 y.o. male past medical history significant of urinary retention, stage IV chronic kidney disease, anemia presents with weakness. Pt with Acute kidney injury on chronic kidney disease stage IV, metabolic acidosis and acute UTI due to recurrent self-catheterization  RD unable to obtain nutrition history from attempt at contacting pt via inpatient room phone. Meal completion has been 75-100%. RD to order nutritional supplements to aid in caloric and protein needs. Per weight records, pt with no significant weight loss.   Unable to complete Nutrition-Focused physical exam at this time.   Labs and medications reviewed  Diet Order:   Diet Order            Diet renal with fluid restriction Fluid restriction: 1200 mL Fluid; Room service appropriate? Yes; Fluid consistency: Thin  Diet effective now              EDUCATION NEEDS:   Not appropriate for education at this time  Skin:  Skin Assessment: Reviewed RN Assessment  Last BM:  4/2  Height:   Ht Readings from Last 1 Encounters:  04/30/18 5\' 10"  (1.778 m)    Weight:   Wt Readings from Last 1 Encounters:  04/30/18 70.1 kg    Ideal Body Weight:  75.45 kg  BMI:  Body mass index is 22.17 kg/m.  Estimated Nutritional Needs:   Kcal:  1900-2100  Protein:  85-95 grams  Fluid:  1.2 L/day    Corrin Parker, MS, RD, LDN Pager #  [phone] After hours/ weekend pager # [phone]

## 2018-05-01 LAB — BPAM RBC
Blood Product Expiration Date: 202004082359
ISSUE DATE / TIME: 202004031058
Unit Type and Rh: 600

## 2018-05-01 LAB — CBC
HCT: 25 % — ABNORMAL LOW (ref 39.0–52.0)
Hemoglobin: 8 g/dL — ABNORMAL LOW (ref 13.0–17.0)
MCH: 27.3 pg (ref 26.0–34.0)
MCHC: 32 g/dL (ref 30.0–36.0)
MCV: 85.3 fL (ref 80.0–100.0)
Platelets: 296 10*3/uL (ref 150–400)
RBC: 2.93 MIL/uL — ABNORMAL LOW (ref 4.22–5.81)
RDW: 16.6 % — ABNORMAL HIGH (ref 11.5–15.5)
WBC: 7.5 10*3/uL (ref 4.0–10.5)
nRBC: 0 % (ref 0.0–0.2)

## 2018-05-01 LAB — RENAL FUNCTION PANEL
Albumin: 2 g/dL — ABNORMAL LOW (ref 3.5–5.0)
Anion gap: 13 (ref 5–15)
BUN: 91 mg/dL — ABNORMAL HIGH (ref 8–23)
CO2: 22 mmol/L (ref 22–32)
Calcium: 6.1 mg/dL — CL (ref 8.9–10.3)
Chloride: 102 mmol/L (ref 98–111)
Creatinine, Ser: 8.34 mg/dL — ABNORMAL HIGH (ref 0.61–1.24)
GFR calc Af Amer: 7 mL/min — ABNORMAL LOW (ref >60)
GFR calc non Af Amer: 6 mL/min — ABNORMAL LOW (ref >60)
Glucose, Bld: 107 mg/dL — ABNORMAL HIGH (ref 70–99)
Phosphorus: 3.6 mg/dL (ref 2.5–4.6)
Potassium: 3.6 mmol/L (ref 3.5–5.1)
Sodium: 137 mmol/L (ref 135–145)

## 2018-05-01 LAB — TYPE AND SCREEN
ABO/RH(D): A POS
Antibody Screen: NEGATIVE
Unit division: 0

## 2018-05-01 MED ORDER — CALCITRIOL 0.5 MCG PO CAPS
0.5000 ug | ORAL_CAPSULE | Freq: Every day | ORAL | Status: DC
  Administered 2018-05-01 – 2018-05-02 (×2): 0.5 ug via ORAL
  Filled 2018-05-01 (×2): qty 1

## 2018-05-01 NOTE — Progress Notes (Signed)
Subjective: Interval History: has no complaint .  Objective: Vital signs in last 24 hours: Temp:  [97.9 F (36.6 C)-98.4 F (36.9 C)] 98.4 F (36.9 C) (04/04 0543) Pulse Rate:  [67-81] 74 (04/04 0543) Resp:  [18-20] 20 (04/04 0543) BP: (102-136)/(63-84) 102/63 (04/04 0543) SpO2:  [97 %-100 %] 97 % (04/04 0543) Weight:  [69.7 kg-70.1 kg] 69.7 kg (04/03 2104) Weight change: 0 kg  Intake/Output from previous day: 04/03 0701 - 04/04 0700 In: 3521 [P.O.:780; I.V.:1219; Blood:322] Out: 1700 [Urine:1700] Intake/Output this shift: Total I/O In: 943 [P.O.:420; I.V.:523] Out: 1000 [Urine:1000]  General appearance: alert, cooperative and no distress Resp: clear to auscultation bilaterally Cardio: S1, S2 normal and systolic murmur: systolic ejection 2/6, crescendo and decrescendo at 2nd left intercostal space GI: pos bs, soft,  Extremities: extremities normal, atraumatic, no cyanosis or edema  Lab Results: Recent Labs    04/30/18 0619 04/30/18 1603 05/01/18 0443  WBC 5.5  --  7.5  HGB 6.7* 7.9* 8.0*  HCT 21.0* 24.3* 25.0*  PLT 300  --  296   BMET:  Recent Labs    04/30/18 0619 05/01/18 0443  NA 136 137  K 3.3* 3.6  CL 100 102  CO2 21* 22  GLUCOSE 95 107*  BUN 97* 91*  CREATININE 9.27* 8.34*  CALCIUM 6.1* 6.1*   Recent Labs    04/28/18 1019  PTH 72*   Iron Studies: No results for input(s): IRON, TIBC, TRANSFERRIN, FERRITIN in the last 72 hours.  Studies/Results: No results found.  I have reviewed the patient's current medications.  Assessment/Plan: 1 AKI/CKD ??baseline level. Slowly better, Acidemia better 2 Anemia 3 Hypocalcemia add vit D,  4 Obstructive Uropathy with neurogenic bladder.  5 Nonadherece P lower ivf, follow chem, if cont to improve,d/c I n1-2 d    LOS: 5 days   Jeneen Rinks Navreet Bolda 05/01/2018,6:41 AM

## 2018-05-01 NOTE — Progress Notes (Signed)
PROGRESS NOTE  Matthew [name] [id number]:[id number] DOB: 08/15/1950 DOA: 04/26/2018 PCP: Wendie Agreste, MD   LOS: 5 days   Brief Narrative / Interim history: Matthew [name] is an 68 y.o. male past medical history significant of urinary retention, stage IV chronic kidney disease, anemia presents with weakness he reports difficulty urinating,  He has been doing self-catheterization for months.  He is taking NSAIDs at home he was referred from her injury care for symptomatic anemia, confusion weakness and falling.  Subjective: -Feels well, no nausea or vomiting, eating well, appetite okay  Assessment & Plan: Principal Problem:   Acute on chronic renal failure (HCC) Active Problems:   Anemia, chronic renal failure, stage 4 (severe) (HCC)   Alcohol abuse   Symptomatic anemia   Metabolic acidosis   Acute lower UTI   Acute uremia   AKI (acute kidney injury) ([place])   Principal Problem Acute kidney injury on chronic kidney disease stage IV, metabolic acidosis -Nephrology consulted, patient was placed on IV fluids -Creatinine slowly improving -Likely in the setting of chronic urinary obstruction, patient self catheterizes at home but may not be frequent enough, he tells me that only 2-3 times per day -Currently has a Foley catheter which he will need to continue at home -Creatinine continues to improve in the 8 range today, appreciate nephrology follow-up  Active Problems Acute UTI due to recurrent self-catheterization -Initially placed on IV Rocephin however cultures showed strep, placed on Augmentin, plan to treat as complicated UTI for 96-78 days total  Symptomatic anemia due to chronic renal failure -Hemoccult negative, he was transfused units of packed red blood cells, hemoglobin stable -No clinical evidence of bleeding  Alcohol use -Patient denies drinking daily, he used to drink a lot more but cut back down on just few days a week significantly.  No apparent withdrawal symptoms  Scheduled Meds: . amoxicillin-clavulanate  1 tablet Oral Daily  . calcitRIOL  0.5 mcg Oral Daily  . feeding supplement (NEPRO CARB STEADY)  237 mL Oral Q1500  . heparin injection (subcutaneous)  5,000 Units Subcutaneous Q8H  . sodium bicarbonate  650 mg Oral BID   Continuous Infusions: . sodium chloride 75 mL/hr at 04/30/18 1909  .  sodium bicarbonate (isotonic) infusion in sterile water 75 mL/hr at 04/28/18 2359   PRN Meds:.acetaminophen **OR** acetaminophen, calcium carbonate (dosed in mg elemental calcium), camphor-menthol **AND** hydrOXYzine, docusate sodium, ondansetron **OR** ondansetron (ZOFRAN) IV, sorbitol, zolpidem  DVT prophylaxis: heparin Code Status: Full code Family Communication: no family at bedside, discussed with daughter over the phone 4/3 Disposition Plan: home when ready   Consultants:   Nephrology   Procedures:   None   Antimicrobials:  Ceftriaxone    Objective: Vitals:   04/30/18 1713 04/30/18 2104 05/01/18 0543 05/01/18 0858  BP: 127/75 127/75 102/63 114/77  Pulse: 67 74 74 87  Resp: 18 20 20 18   Temp: 98.4 F (36.9 C) 97.9 F (36.6 C) 98.4 F (36.9 C) 98 F (36.7 C)  TempSrc: Oral Oral Oral Oral  SpO2: 98% 100% 97% 100%  Weight:  69.7 kg    Height:        Intake/Output Summary (Last 24 hours) at 05/01/2018 1054 Last data filed at 05/01/2018 0842 Gross per 24 hour  Intake 3641 ml  Output 1700 ml  Net 1941 ml   Filed Weights   04/29/18 2141 04/30/18 1128 04/30/18 2104  Weight: 70.1 kg 70.1 kg 69.7 kg    Examination:  Constitutional: NAD Eyes: No scleral icterus ENMT:  MMM Respiratory: Clear to station bilaterally, good air movement, no wheezing, no crackles Cardiovascular: Regular rate and rhythm, no edema Abdomen: Soft, nontender, nondistended, bowel sounds positive Musculoskeletal: no clubbing / cyanosis.  Skin: No rashes Neurologic: Nonfocal   Data Reviewed: I have independently reviewed following labs and imaging studies    CBC: Recent Labs  Lab 04/29/2018 1253 04-29-2018 2258 04/27/18 0700 04/29/18 0541 04/30/18 0619 04/30/18 1603 05/01/18 0443  WBC 9.5 9.5  --  6.5 5.5  --  7.5  NEUTROABS 6.6  --   --   --   --   --   --   HGB 6.6* 6.4* 8.1* 7.1* 6.7* 7.9* 8.0*  HCT 21.1* 20.6* 25.7* 21.4* 21.0* 24.3* 25.0*  MCV 90.2 86.6  --  82.6 86.1  --  85.3  PLT 461* 365  --  328 300  --  841   Basic Metabolic Panel: Recent Labs  Lab 04/27/18 0911 04/28/18 0346 04/29/18 0542 04/30/18 0619 05/01/18 0443  NA 136 136 136 136 137  K 4.1 3.1* 3.1* 3.3* 3.6  CL 107 106 100 100 102  CO2 13* 16* 24 21* 22  GLUCOSE 65* 141* 97 95 107*  BUN 139* 126* 111* 97* 91*  CREATININE 13.16* 11.80* 10.65* 9.27* 8.34*  CALCIUM 7.6* 6.8* 6.2* 6.1* 6.1*  PHOS  --   --  5.2* 4.4 3.6   GFR: Estimated Creatinine Clearance: 8.5 mL/min (A) (by C-G formula based on SCr of 8.34 mg/dL (H)). Liver Function Tests: Recent Labs  Lab 29-Apr-2018 1214 Apr 29, 2018 1253 04/29/18 0542 04/30/18 0619 05/01/18 0443  AST 7 9*  --   --   --   ALT 5 6  --   --   --   ALKPHOS 56 51  --   --   --   BILITOT 0.4 0.4  --   --   --   PROT 9.3* 10.5*  --   --   --   ALBUMIN 3.7* 3.1* 2.0* 2.0* 2.0*   No results for input(s): LIPASE, AMYLASE in the last 168 hours. No results for input(s): AMMONIA in the last 168 hours. Coagulation Profile: No results for input(s): INR, PROTIME in the last 168 hours. Cardiac Enzymes: No results for input(s): CKTOTAL, CKMB, CKMBINDEX, TROPONINI in the last 168 hours. BNP (last 3 results) Recent Labs    07/31/17 1540  PROBNP 93.0   HbA1C: No results for input(s): HGBA1C in the last 72 hours. CBG: No results for input(s): GLUCAP in the last 168 hours. Lipid Profile: No results for input(s): CHOL, HDL, LDLCALC, TRIG, CHOLHDL, LDLDIRECT in the last 72 hours. Thyroid Function Tests: No results for input(s): TSH, T4TOTAL, FREET4, T3FREE, THYROIDAB in the last 72 hours. Anemia Panel: No results for input(s):  VITAMINB12, FOLATE, FERRITIN, TIBC, IRON, RETICCTPCT in the last 72 hours. Urine analysis:    Component Value Date/Time   COLORURINE YELLOW 04/29/18 1430   APPEARANCEUR CLOUDY (A) 29-Apr-2018 1430   LABSPEC 1.009 04/29/18 1430   PHURINE 6.0 Apr 29, 2018 1430   GLUCOSEU NEGATIVE 2018-04-29 1430   HGBUR MODERATE (A) 04-29-2018 1430   BILIRUBINUR NEGATIVE Apr 29, 2018 1430   BILIRUBINUR negative 04/29/18 1049   KETONESUR NEGATIVE 04/29/2018 1430   PROTEINUR 100 (A) 29-Apr-2018 1430   UROBILINOGEN 0.2 04-29-2018 1049   NITRITE NEGATIVE 04/29/18 1430   LEUKOCYTESUR LARGE (A) 04-29-18 1430   Sepsis Labs: Invalid input(s): PROCALCITONIN, LACTICIDVEN  Recent Results (from the past 240 hour(s))  Urine Culture     Status:  Abnormal   Collection Time: 04/26/18 11:34 AM  Result Value Ref Range Status   Urine Culture, Routine Final report (A)  Final   Organism ID, Bacteria Comment (A)  Final    Comment: Beta hemolytic Streptococcus, group B Greater than 100,000 colony forming units per mL Penicillin and ampicillin are drugs of choice for treatment of beta-hemolytic streptococcal infections. Susceptibility testing of penicillins and other beta-lactam agents approved by the FDA for treatment of beta-hemolytic streptococcal infections need not be performed routinely because nonsusceptible isolates are extremely rare in any beta-hemolytic streptococcus and have not been reported for Streptococcus pyogenes (group A). (CLSI)   Urine culture     Status: Abnormal   Collection Time: 04/26/18  2:30 PM  Result Value Ref Range Status   Specimen Description   Final    URINE, CLEAN CATCH Performed at [facility], [place] [address] [place], [place] [address]    Special Requests   Final    NONE Performed at [facility], [place] [address], [place], [place] [address]    Culture (A)  Final    >=100,000 COLONIES/mL GROUP B STREP(S.AGALACTIAE)ISOLATED TESTING  AGAINST S. AGALACTIAE NOT ROUTINELY PERFORMED DUE TO PREDICTABILITY OF AMP/PEN/VAN SUSCEPTIBILITY. Performed at [facility], [place] [address] [place], [place] [address]    Report Status 04/28/2018 FINAL  Final      Radiology Studies: No results found.  Marzetta Board, MD, PhD [facility]  Contact via  [email or web address]  [place] P: [phone]  F: [phone]

## 2018-05-02 LAB — RENAL FUNCTION PANEL
Albumin: 2 g/dL — ABNORMAL LOW (ref 3.5–5.0)
Anion gap: 9 (ref 5–15)
BUN: 84 mg/dL — ABNORMAL HIGH (ref 8–23)
CO2: 25 mmol/L (ref 22–32)
Calcium: 6.7 mg/dL — ABNORMAL LOW (ref 8.9–10.3)
Chloride: 103 mmol/L (ref 98–111)
Creatinine, Ser: 7.39 mg/dL — ABNORMAL HIGH (ref 0.61–1.24)
GFR calc Af Amer: 8 mL/min — ABNORMAL LOW (ref >60)
GFR calc non Af Amer: 7 mL/min — ABNORMAL LOW (ref >60)
Glucose, Bld: 94 mg/dL (ref 70–99)
Phosphorus: 3.9 mg/dL (ref 2.5–4.6)
Potassium: 4.1 mmol/L (ref 3.5–5.1)
Sodium: 137 mmol/L (ref 135–145)

## 2018-05-02 MED ORDER — AMOXICILLIN-POT CLAVULANATE 500-125 MG PO TABS: 1.0000 | ORAL_TABLET | Freq: Every day | ORAL | 0 refills | Status: DC

## 2018-05-02 MED ORDER — SODIUM BICARBONATE 650 MG PO TABS: 650.0000 mg | ORAL_TABLET | Freq: Two times a day (BID) | ORAL | 1 refills | Status: DC

## 2018-05-02 MED ORDER — CALCITRIOL 0.5 MCG PO CAPS: 0.5000 ug | ORAL_CAPSULE | Freq: Every day | ORAL | 1 refills | Status: DC

## 2018-05-02 NOTE — Discharge Summary (Signed)
Physician Discharge Summary  Matthew [name] HQP:[id number] DOB: 04-May-1950 DOA: 04/26/2018  PCP: Wendie Agreste, MD  Admit date: 04/26/2018 Discharge date: 05/02/2018  Admitted From: home Disposition:  home  Recommendations for Outpatient Follow-up:  1. Follow up with PCP in 1-2 weeks 2. Please call [facility] for follow-up blood work in 3 days 3. Please call your urologist for follow-up within 2 to 3 weeks  Home Health: None Equipment/Devices: None  Discharge Condition: Stable CODE STATUS: Full code Diet recommendation: Renal diet  HPI: Per admitting MD, Matthew [name] is a 68 y.o. male with medical history significant of urinary retention; stage IV CKD with anemia presenting with weakness.  He reports difficulty with his urine.  He has had issues on and off for a month or longer.  He has been doing self-caths at home for months.  He is making urine "but it's bad though."  It is becoming difficult for him to make it.  Intermittent confusion.  +weak/tired.  He has talked about dialysis in the past, maybe a month ago. ED Course:   Referred from urgent care for symptomatic anemia.  Not doing well for weeks to months - confusion, weakness, falling.  Has h/o bladder outlet obstruction with intermittent caths at home.  Acute renal failure, creatinine 14.  Dr. Jonnie Finner to consult, likely needs admission at [facility].  Given Rocephin for UTI, foley placed with 450 cc out.  Ordered 1 unit PRBC for Hgb 6.6.  Possible pneumothorax on CXR but CT chest shows huge bulla.  Hospital Course: Principal Problem Acute kidney injury on chronic kidney disease stage IV, metabolic acidosis -nephrology consulted and followed patient while hospitalized.  His renal failure is likely in the setting of chronic urinary obstruction, patient self catheterizes at home but may not be frequent enough as he reported that he only does it 2-3 times per day.  He had a Foley catheter placed while hospitalized,  received IV fluids and his creatinine has improved from 13 into the 7 range over about 4 days.  Discussed with nephrology, patient will be discharged home in stable condition, advised to maintain indwelling Foley and to follow-up with nephrology in 3 days for repeat blood work and follow-up with urology in 2 to 3 weeks  Active Problems  Acute UTI due to recurrent self-catheterization -Initially placed on IV Rocephin however cultures showed strep, placed on Augmentin, plan to treat as complicated UTI for 10 days with 7 remaining upon discharge  Chronic urinary retention -Followed by [facility], advised patient to follow-up with his primary urologist within 2 to 3 weeks as his Foley will need to be changed on a monthly basis.  Symptomatic anemia due to chronic renal failure -Hemoccult negative, he was transfused 2 units of packed red blood cells, hemoglobin stable, no clinical evidence of bleeding  Alcohol use -Patient denies drinking daily, he used to drink a lot more but cut back down on just few days a week.  Did not have withdrawal symptoms while hospitalized  Discharge Diagnoses:  Principal Problem:   Acute on chronic renal failure (HCC) Active Problems:   Anemia, chronic renal failure, stage 4 (severe) (HCC)   Alcohol abuse   Symptomatic anemia   Metabolic acidosis   Acute lower UTI   Acute uremia   AKI (acute kidney injury) [facility])     Discharge Instructions   Allergies as of 05/02/2018   No Known Allergies     Medication List    TAKE these medications  amoxicillin-clavulanate 500-125 MG tablet Commonly known as:  AUGMENTIN Take 1 tablet (500 mg total) by mouth daily. Start taking on:  May 03, 2018   calcitRIOL 0.5 MCG capsule Commonly known as:  ROCALTROL Take 1 capsule (0.5 mcg total) by mouth daily. Start taking on:  May 03, 2018   sodium bicarbonate 650 MG tablet Take 1 tablet (650 mg total) by mouth 2 (two) times daily.      Follow-up Information     [facility] [place] Follow up.   Why:  call on Monday for repeat blood work on Wednesday Contact information: [place] [address] [phone]        [facility] Follow up.   Why:  follow up with your Urologist within 2-3 weeks Contact information: [place] [phone]          Consultations:  Nephrology   Procedures/Studies:  Ct Head Wo Contrast  Result Date: 04/26/2018 CLINICAL DATA:  Altered mental status.  Cough for 3 weeks. EXAM: CT HEAD WITHOUT CONTRAST TECHNIQUE: Contiguous axial images were obtained from the base of the skull through the vertex without intravenous contrast. COMPARISON:  None. FINDINGS: Brain: No evidence of acute infarction, hemorrhage, hydrocephalus, extra-axial collection or mass lesion/mass effect. Mild atrophy and chronic microvascular ischemic change noted. Vascular: No hyperdense vessel or unexpected calcification. Skull: Negative. Sinuses/Orbits: Negative. Other: None. IMPRESSION: No acute abnormality. Mild atrophy and chronic microvascular ischemic change. Electronically Signed   By: Inge Rise M.D.   On: 04/26/2018 13:41   Ct Chest Wo Contrast  Result Date: 04/26/2018 CLINICAL DATA:  Cough. EXAM: CT CHEST WITHOUT CONTRAST TECHNIQUE: Multidetector CT imaging of the chest was performed following the standard protocol without IV contrast. COMPARISON:  Radiograph of same day. FINDINGS: Cardiovascular: Atherosclerosis of thoracic aorta is noted without aneurysm formation. Normal cardiac size. No pericardial effusion. Mediastinum/Nodes: No enlarged mediastinal or axillary lymph nodes. Thyroid gland, trachea, and esophagus demonstrate no significant findings. Lungs/Pleura: No pneumothorax or pleural effusion is noted. Large solitary bulla is noted in left upper lobe measuring 15 x 10 cm. Right lung is clear. No pulmonary consolidation is noted. Upper Abdomen: There appears to be  bilateral hydronephrosis present of unknown etiology. No definite calculi are noted. Musculoskeletal: No chest wall mass or suspicious bone lesions identified. IMPRESSION: Large solitary bulla is noted in left upper lobe measuring 15 x 10 cm. No pneumothorax or pulmonary consolidation is noted. Incidental note is made of bilateral hydronephrosis seen in visualized portion of upper abdomen. Further evaluation with renal ultrasound or CT urogram is recommended. Aortic Atherosclerosis (ICD10-I70.0). Electronically Signed   By: Marijo Conception, M.D.   On: 04/26/2018 13:46   US Renal  Result Date: 04/27/2018 CLINICAL DATA:  Acute on chronic renal insufficiency. EXAM: RENAL / URINARY TRACT ULTRASOUND COMPLETE COMPARISON:  None. FINDINGS: Right Kidney: Renal measurements: 11.0 x 5.1 x 3.7 cm = volume: 108 mL. Increased echogenicity. Moderate to severe hydronephrosis. No mass visualized. Left Kidney: Renal measurements: 12.5 x 6.7 x 4.6 cm = volume: 203 mL. Increased echogenicity. Moderate hydronephrosis. No mass visualized. Bladder: Decompressed by Foley catheter. Bladder wall thickening may be related to underdistention. IMPRESSION: 1. Moderate to severe right and moderate left hydronephrosis. 2. Bilateral increased renal cortical echogenicity, consistent with medical renal disease. Electronically Signed   By: Titus Dubin M.D.   On: 04/27/2018 12:51   Dg Chest Port 1 View  Result Date: 04/26/2018 CLINICAL DATA:  Cough for  2 weeks. EXAM: PORTABLE CHEST 1 VIEW COMPARISON:  03/20/2017 FINDINGS: The cardiomediastinal silhouette is unchanged with borderline to mild cardiomegaly. Aortic atherosclerosis is noted. There is chronic lucency in the left upper hemithorax with a large apical bulla shown on multiple prior studies, however this area is more hyperlucent on today's study now with a suspected pleural line at the level of the aortic knob concerning for a new pneumothorax. Mild scarring and/or subsegmental  atelectasis is present in the left mid lung. No pleural effusion is identified. No acute osseous abnormality is seen. IMPRESSION: Chronic lucency in the left upper hemithorax with different appearance on today's study concerning for a pneumothorax superimposed on bullous disease. Chest CT could be considered for further evaluation. Critical Value/emergent results were called by telephone at the time of interpretation on 04/26/2018 at 1:15 pm to Dr. Quintella Reichert , who verbally acknowledged these results. Electronically Signed   By: Logan Bores M.D.   On: 04/26/2018 13:18     Subjective: - no chest pain, shortness of breath, no abdominal pain, nausea or vomiting.   Discharge Exam: BP 110/70 (BP Location: Right Arm)    Pulse 70    Temp 98.6 F (37 C) (Oral)    Resp 18    Ht 5\' 10"  (1.778 m)    Wt 69.7 kg    SpO2 100%    BMI 22.05 kg/m   General: Pt is alert, awake, not in acute distress Cardiovascular: RRR, S1/S2 +, no rubs, no gallops Respiratory: CTA bilaterally, no wheezing, no rhonchi Abdominal: Soft, NT, ND, bowel sounds + Extremities: no edema, no cyanosis   The results of significant diagnostics from this hospitalization (including imaging, microbiology, ancillary and laboratory) are listed below for reference.     Microbiology: Recent Results (from the past 240 hour(s))  Urine Culture     Status: Abnormal   Collection Time: 04/26/18 11:34 AM  Result Value Ref Range Status   Urine Culture, Routine Final report (A)  Final   Organism ID, Bacteria Comment (A)  Final    Comment: Beta hemolytic Streptococcus, group B Greater than 100,000 colony forming units per mL Penicillin and ampicillin are drugs of choice for treatment of beta-hemolytic streptococcal infections. Susceptibility testing of penicillins and other beta-lactam agents approved by the FDA for treatment of beta-hemolytic streptococcal infections need not be performed routinely because nonsusceptible isolates are  extremely rare in any beta-hemolytic streptococcus and have not been reported for Streptococcus pyogenes (group A). (CLSI)   Urine culture     Status: Abnormal   Collection Time: 04/26/18  2:30 PM  Result Value Ref Range Status   Specimen Description   Final    URINE, CLEAN CATCH Performed at [facility], [place] [address], [place], [place] [address]    Special Requests   Final    NONE Performed at [facility], [place] [address], [place], [place] [address]    Culture (A)  Final    >=100,000 COLONIES/mL GROUP B STREP(S.AGALACTIAE)ISOLATED TESTING AGAINST S. AGALACTIAE NOT ROUTINELY PERFORMED DUE TO PREDICTABILITY OF AMP/PEN/VAN SUSCEPTIBILITY. Performed at [facility], [place] [address], [place], [place] [address]    Report Status 04/28/2018 FINAL  Final     Labs: BNP (last 3 results) No results for input(s): BNP in the last 8760 hours. Basic Metabolic Panel: Recent Labs  Lab 04/28/18 0346 04/29/18 0542 04/30/18 0619 05/01/18 0443 05/02/18 0630  NA 136 136 136 137 137  K 3.1* 3.1* 3.3* 3.6 4.1  CL 106 100 100 102 103  CO2 16* 24 21* 22 25  GLUCOSE 141* 97 95 107* 94  BUN 126* 111* 97* 91* 84*  CREATININE 11.80* 10.65* 9.27* 8.34* 7.39*  CALCIUM 6.8* 6.2* 6.1* 6.1* 6.7*  PHOS  --  5.2* 4.4 3.6 3.9   Liver Function Tests: Recent Labs  Lab 04/26/18 1214 04/26/18 1253 04/29/18 0542 04/30/18 0619 05/01/18 0443 05/02/18 0630  AST 7 9*  --   --   --   --   ALT 5 6  --   --   --   --   ALKPHOS 56 51  --   --   --   --   BILITOT 0.4 0.4  --   --   --   --   PROT 9.3* 10.5*  --   --   --   --   ALBUMIN 3.7* 3.1* 2.0* 2.0* 2.0* 2.0*   No results for input(s): LIPASE, AMYLASE in the last 168 hours. No results for input(s): AMMONIA in the last 168 hours. CBC: Recent Labs  Lab 04/26/18 1253 04/26/18 2258 04/27/18 0700 04/29/18 0541 04/30/18 0619 04/30/18 1603 05/01/18 0443  WBC 9.5 9.5  --  6.5 5.5  --  7.5  NEUTROABS 6.6   --   --   --   --   --   --   HGB 6.6* 6.4* 8.1* 7.1* 6.7* 7.9* 8.0*  HCT 21.1* 20.6* 25.7* 21.4* 21.0* 24.3* 25.0*  MCV 90.2 86.6  --  82.6 86.1  --  85.3  PLT 461* 365  --  328 300  --  296   Cardiac Enzymes: No results for input(s): CKTOTAL, CKMB, CKMBINDEX, TROPONINI in the last 168 hours. BNP: Invalid input(s): POCBNP CBG: No results for input(s): GLUCAP in the last 168 hours. D-Dimer No results for input(s): DDIMER in the last 72 hours. Hgb A1c No results for input(s): HGBA1C in the last 72 hours. Lipid Profile No results for input(s): CHOL, HDL, LDLCALC, TRIG, CHOLHDL, LDLDIRECT in the last 72 hours. Thyroid function studies No results for input(s): TSH, T4TOTAL, T3FREE, THYROIDAB in the last 72 hours.  Invalid input(s): FREET3 Anemia work up No results for input(s): VITAMINB12, FOLATE, FERRITIN, TIBC, IRON, RETICCTPCT in the last 72 hours. Urinalysis    Component Value Date/Time   COLORURINE YELLOW 04/26/2018 1430   APPEARANCEUR CLOUDY (A) 04/26/2018 1430   LABSPEC 1.009 04/26/2018 1430   PHURINE 6.0 04/26/2018 1430   GLUCOSEU NEGATIVE 04/26/2018 1430   HGBUR MODERATE (A) 04/26/2018 1430   BILIRUBINUR NEGATIVE 04/26/2018 1430   BILIRUBINUR negative 04/26/2018 1049   KETONESUR NEGATIVE 04/26/2018 1430   PROTEINUR 100 (A) 04/26/2018 1430   UROBILINOGEN 0.2 04/26/2018 1049   NITRITE NEGATIVE 04/26/2018 1430   LEUKOCYTESUR LARGE (A) 04/26/2018 1430   Sepsis Labs Invalid input(s): PROCALCITONIN,  WBC,  LACTICIDVEN  FURTHER DISCHARGE INSTRUCTIONS:   Get Medicines reviewed and adjusted: Please take all your medications with you for your next visit with your Primary MD   Laboratory/radiological data: Please request your Primary MD to go over all hospital tests and procedure/radiological results at the follow up, please ask your Primary MD to get all Hospital records sent to his/her office.   In some cases, they will be blood work, cultures and biopsy results  pending at the time of your discharge. Please request that your primary care M.D. goes through all the records of your hospital data and follows up on these results.   Also Note  the following: If you experience worsening of your admission symptoms, develop shortness of breath, life threatening emergency, suicidal or homicidal thoughts you must seek medical attention immediately by calling 911 or calling your MD immediately  if symptoms less severe.   You must read complete instructions/literature along with all the possible adverse reactions/side effects for all the Medicines you take and that have been prescribed to you. Take any new Medicines after you have completely understood and accpet all the possible adverse reactions/side effects.    Do not drive when taking Pain medications or sleeping medications (Benzodaizepines)   Do not take more than prescribed Pain, Sleep and Anxiety Medications. It is not advisable to combine anxiety,sleep and pain medications without talking with your primary care practitioner   Special Instructions: If you have smoked or chewed Tobacco  in the last 2 yrs please stop smoking, stop any regular Alcohol  and or any Recreational drug use.   Wear Seat belts while driving.   Please note: You were cared for by a hospitalist during your hospital stay. Once you are discharged, your primary care physician will handle any further medical issues. Please note that NO REFILLS for any discharge medications will be authorized once you are discharged, as it is imperative that you return to your primary care physician (or establish a relationship with a primary care physician if you do not have one) for your post hospital discharge needs so that they can reassess your need for medications and monitor your lab values.  Time coordinating discharge: 35 minutes  SIGNED:  Marzetta Board, PA-S 05/02/2018, 10:17 AM

## 2018-05-02 NOTE — Progress Notes (Signed)
Donzetta Kohut to be D/C'd Home per MD order.  Discussed prescriptions and follow up appointments with the patient. Prescriptions given to patient, medication list explained in detail. Pt verbalized understanding.going home with indwelling catheter attached to a foley leg bag. Taped and secured.  Allergies as of 05/02/2018   No Known Allergies     Medication List    TAKE these medications   amoxicillin-clavulanate 500-125 MG tablet Commonly known as:  AUGMENTIN Take 1 tablet (500 mg total) by mouth daily. Start taking on:  May 03, 2018   calcitRIOL 0.5 MCG capsule Commonly known as:  ROCALTROL Take 1 capsule (0.5 mcg total) by mouth daily. Start taking on:  May 03, 2018   sodium bicarbonate 650 MG tablet Take 1 tablet (650 mg total) by mouth 2 (two) times daily.       Vitals:   05/02/18 0453 05/02/18 0855  BP: 114/68 110/70  Pulse: 69 70  Resp: 18 18  Temp: 98.9 F (37.2 C) 98.6 F (37 C)  SpO2: 97% 100%    Skin clean, dry and intact without evidence of skin break down, no evidence of skin tears noted. IV catheter discontinued intact. Site without signs and symptoms of complications. Dressing and pressure applied. Pt denies pain at this time. No complaints noted.  An After Visit Summary was printed and given to the patient. Patient escorted via [place], and D/C home via private auto.

## 2018-05-02 NOTE — Discharge Instructions (Signed)
Follow with Wendie Agreste, MD in 5-7 days  Please get a complete blood count and chemistry panel checked by your Primary MD at your next visit, and again as instructed by your Primary MD. Please get your medications reviewed and adjusted by your Primary MD.  Please request your Primary MD to go over all Hospital Tests and Procedure/Radiological results at the follow up, please get all Hospital records sent to your Prim MD by signing hospital release before you go home.  In some cases, there will be blood work, cultures and biopsy results pending at the time of your discharge. Please request that your primary care M.D. goes through all the records of your hospital data and follows up on these results.  If you had Pneumonia of Lung problems at the Hospital: Please get a 2 view Chest X ray done in 6-8 weeks after hospital discharge or sooner if instructed by your Primary MD.  If you have Congestive Heart Failure: Please call your Cardiologist or Primary MD anytime you have any of the following symptoms:  1) 3 pound weight gain in 24 hours or 5 pounds in 1 week  2) shortness of breath, with or without a dry hacking cough  3) swelling in the hands, feet or stomach  4) if you have to sleep on extra pillows at night in order to breathe  Follow cardiac low salt diet and 1.5 lit/day fluid restriction.  If you have diabetes Accuchecks 4 times/day, Once in AM empty stomach and then before each meal. Log in all results and show them to your primary doctor at your next visit. If any glucose reading is under 80 or above 300 call your primary MD immediately.  If you have Seizure/Convulsions/Epilepsy: Please do not drive, operate heavy machinery, participate in activities at heights or participate in high speed sports until you have seen by Primary MD or a Neurologist and advised to do so again.  If you had Gastrointestinal Bleeding: Please ask your Primary MD to check a complete blood count within  one week of discharge or at your next visit. Your endoscopic/colonoscopic biopsies that are pending at the time of discharge, will also need to followed by your Primary MD.  Get Medicines reviewed and adjusted. Please take all your medications with you for your next visit with your Primary MD  Please request your Primary MD to go over all hospital tests and procedure/radiological results at the follow up, please ask your Primary MD to get all Hospital records sent to his/her office.  If you experience worsening of your admission symptoms, develop shortness of breath, life threatening emergency, suicidal or homicidal thoughts you must seek medical attention immediately by calling 911 or calling your MD immediately  if symptoms less severe.  You must read complete instructions/literature along with all the possible adverse reactions/side effects for all the Medicines you take and that have been prescribed to you. Take any new Medicines after you have completely understood and accpet all the possible adverse reactions/side effects.   Do not drive or operate heavy machinery when taking Pain medications.   Do not take more than prescribed Pain, Sleep and Anxiety Medications  Special Instructions: If you have smoked or chewed Tobacco  in the last 2 yrs please stop smoking, stop any regular Alcohol  and or any Recreational drug use.  Wear Seat belts while driving.  Please note You were cared for by a hospitalist during your hospital stay. If you have any questions about your  discharge medications or the care you received while you were in the hospital after you are discharged, you can call the unit and asked to speak with the hospitalist on call if the hospitalist that took care of you is not available. Once you are discharged, your primary care physician will handle any further medical issues. Please note that NO REFILLS for any discharge medications will be authorized once you are discharged, as it is  imperative that you return to your primary care physician (or establish a relationship with a primary care physician if you do not have one) for your aftercare needs so that they can reassess your need for medications and monitor your lab values.  You can reach the hospitalist office at phone [phone] or fax [phone]   If you do not have a primary care physician, you can call [phone] for a physician referral.  Activity: As tolerated with Full fall precautions use walker/cane & assistance as needed    Diet: renal  Disposition Home

## 2018-05-03 ENCOUNTER — Emergency Department (HOSPITAL_COMMUNITY): Payer: Medicare Other

## 2018-05-03 ENCOUNTER — Inpatient Hospital Stay (HOSPITAL_COMMUNITY)
Admission: EM | Admit: 2018-05-03 | Discharge: 2018-05-06 | DRG: 689 | Disposition: A | Discharge status: Home / Self Care | Payer: Medicare Other | Attending: Internal Medicine | Admitting: Internal Medicine

## 2018-05-03 ENCOUNTER — Other Ambulatory Visit: Payer: Self-pay

## 2018-05-03 ENCOUNTER — Encounter (HOSPITAL_COMMUNITY): Payer: Self-pay | Admitting: Emergency Medicine

## 2018-05-03 DIAGNOSIS — N136 Pyonephrosis: Principal | ICD-10-CM | POA: Diagnosis present

## 2018-05-03 DIAGNOSIS — Y92009 Unspecified place in unspecified non-institutional (private) residence as the place of occurrence of the external cause: Secondary | ICD-10-CM

## 2018-05-03 DIAGNOSIS — R410 Disorientation, unspecified: Secondary | ICD-10-CM | POA: Diagnosis not present

## 2018-05-03 DIAGNOSIS — R404 Transient alteration of awareness: Secondary | ICD-10-CM | POA: Diagnosis not present

## 2018-05-03 DIAGNOSIS — R6889 Other general symptoms and signs: Secondary | ICD-10-CM

## 2018-05-03 DIAGNOSIS — W19XXXA Unspecified fall, initial encounter: Secondary | ICD-10-CM | POA: Diagnosis not present

## 2018-05-03 DIAGNOSIS — N319 Neuromuscular dysfunction of bladder, unspecified: Secondary | ICD-10-CM | POA: Diagnosis present

## 2018-05-03 DIAGNOSIS — E872 Acidosis, unspecified: Secondary | ICD-10-CM | POA: Diagnosis present

## 2018-05-03 DIAGNOSIS — N185 Chronic kidney disease, stage 5: Secondary | ICD-10-CM | POA: Diagnosis present

## 2018-05-03 DIAGNOSIS — B951 Streptococcus, group B, as the cause of diseases classified elsewhere: Secondary | ICD-10-CM | POA: Diagnosis present

## 2018-05-03 DIAGNOSIS — Z20828 Contact with and (suspected) exposure to other viral communicable diseases: Secondary | ICD-10-CM | POA: Diagnosis present

## 2018-05-03 DIAGNOSIS — Z87440 Personal history of urinary (tract) infections: Secondary | ICD-10-CM

## 2018-05-03 DIAGNOSIS — R4182 Altered mental status, unspecified: Secondary | ICD-10-CM

## 2018-05-03 DIAGNOSIS — Z87891 Personal history of nicotine dependence: Secondary | ICD-10-CM

## 2018-05-03 DIAGNOSIS — R6510 Systemic inflammatory response syndrome (SIRS) of non-infectious origin without acute organ dysfunction: Secondary | ICD-10-CM | POA: Diagnosis present

## 2018-05-03 DIAGNOSIS — I120 Hypertensive chronic kidney disease with stage 5 chronic kidney disease or end stage renal disease: Secondary | ICD-10-CM | POA: Diagnosis present

## 2018-05-03 DIAGNOSIS — N184 Chronic kidney disease, stage 4 (severe): Secondary | ICD-10-CM | POA: Diagnosis not present

## 2018-05-03 DIAGNOSIS — D649 Anemia, unspecified: Secondary | ICD-10-CM | POA: Diagnosis present

## 2018-05-03 DIAGNOSIS — R9082 White matter disease, unspecified: Secondary | ICD-10-CM | POA: Diagnosis not present

## 2018-05-03 DIAGNOSIS — R278 Other lack of coordination: Secondary | ICD-10-CM | POA: Diagnosis present

## 2018-05-03 DIAGNOSIS — D631 Anemia in chronic kidney disease: Secondary | ICD-10-CM | POA: Diagnosis present

## 2018-05-03 DIAGNOSIS — R509 Fever, unspecified: Secondary | ICD-10-CM | POA: Diagnosis not present

## 2018-05-03 DIAGNOSIS — N179 Acute kidney failure, unspecified: Secondary | ICD-10-CM | POA: Diagnosis present

## 2018-05-03 DIAGNOSIS — Z20822 Contact with and (suspected) exposure to covid-19: Secondary | ICD-10-CM

## 2018-05-03 DIAGNOSIS — E871 Hypo-osmolality and hyponatremia: Secondary | ICD-10-CM | POA: Diagnosis present

## 2018-05-03 DIAGNOSIS — R55 Syncope and collapse: Secondary | ICD-10-CM | POA: Diagnosis present

## 2018-05-03 DIAGNOSIS — G9341 Metabolic encephalopathy: Secondary | ICD-10-CM | POA: Diagnosis present

## 2018-05-03 DIAGNOSIS — I088 Other rheumatic multiple valve diseases: Secondary | ICD-10-CM | POA: Diagnosis present

## 2018-05-03 DIAGNOSIS — Z79899 Other long term (current) drug therapy: Secondary | ICD-10-CM

## 2018-05-03 DIAGNOSIS — M47892 Other spondylosis, cervical region: Secondary | ICD-10-CM | POA: Diagnosis not present

## 2018-05-03 DIAGNOSIS — F1010 Alcohol abuse, uncomplicated: Secondary | ICD-10-CM | POA: Diagnosis present

## 2018-05-03 DIAGNOSIS — Z8249 Family history of ischemic heart disease and other diseases of the circulatory system: Secondary | ICD-10-CM

## 2018-05-03 DIAGNOSIS — B954 Other streptococcus as the cause of diseases classified elsewhere: Secondary | ICD-10-CM | POA: Diagnosis present

## 2018-05-03 DIAGNOSIS — E1122 Type 2 diabetes mellitus with diabetic chronic kidney disease: Secondary | ICD-10-CM | POA: Diagnosis present

## 2018-05-03 DIAGNOSIS — N1330 Unspecified hydronephrosis: Secondary | ICD-10-CM

## 2018-05-03 LAB — URINALYSIS, ROUTINE W REFLEX MICROSCOPIC
Bilirubin Urine: NEGATIVE
Glucose, UA: NEGATIVE mg/dL
Ketones, ur: NEGATIVE mg/dL
Nitrite: NEGATIVE
Protein, ur: 100 mg/dL — AB
RBC / HPF: >50 RBC/hpf — ABNORMAL HIGH (ref 0–5)
Specific Gravity, Urine: 1.011 (ref 1.005–1.030)
WBC, UA: >50 WBC/hpf — ABNORMAL HIGH (ref 0–5)
pH: 6 (ref 5.0–8.0)

## 2018-05-03 LAB — CBC WITH DIFFERENTIAL/PLATELET
Abs Immature Granulocytes: 0.07 10*3/uL (ref 0.00–0.07)
Basophils Absolute: 0.1 10*3/uL (ref 0.0–0.1)
Basophils Relative: 1 %
Eosinophils Absolute: 0.3 10*3/uL (ref 0.0–0.5)
Eosinophils Relative: 2 %
HCT: 29.1 % — ABNORMAL LOW (ref 39.0–52.0)
Hemoglobin: 8.8 g/dL — ABNORMAL LOW (ref 13.0–17.0)
Immature Granulocytes: 1 %
Lymphocytes Relative: 16 %
Lymphs Abs: 1.9 10*3/uL (ref 0.7–4.0)
MCH: 27 pg (ref 26.0–34.0)
MCHC: 30.2 g/dL (ref 30.0–36.0)
MCV: 89.3 fL (ref 80.0–100.0)
Monocytes Absolute: 0.8 10*3/uL (ref 0.1–1.0)
Monocytes Relative: 7 %
Neutro Abs: 9.3 10*3/uL — ABNORMAL HIGH (ref 1.7–7.7)
Neutrophils Relative %: 73 %
Platelets: 298 10*3/uL (ref 150–400)
RBC: 3.26 MIL/uL — ABNORMAL LOW (ref 4.22–5.81)
RDW: 15.8 % — ABNORMAL HIGH (ref 11.5–15.5)
WBC: 12.5 10*3/uL — ABNORMAL HIGH (ref 4.0–10.5)
nRBC: 0 % (ref 0.0–0.2)

## 2018-05-03 LAB — LACTIC ACID, PLASMA: Lactic Acid, Venous: 1.1 mmol/L (ref 0.5–1.9)

## 2018-05-03 LAB — COMPREHENSIVE METABOLIC PANEL
ALT: 16 U/L (ref 0–44)
AST: 20 U/L (ref 15–41)
Albumin: 2.4 g/dL — ABNORMAL LOW (ref 3.5–5.0)
Alkaline Phosphatase: 39 U/L (ref 38–126)
Anion gap: 13 (ref 5–15)
BUN: 79 mg/dL — ABNORMAL HIGH (ref 8–23)
CO2: 21 mmol/L — ABNORMAL LOW (ref 22–32)
Calcium: 7 mg/dL — ABNORMAL LOW (ref 8.9–10.3)
Chloride: 102 mmol/L (ref 98–111)
Creatinine, Ser: 7.31 mg/dL — ABNORMAL HIGH (ref 0.61–1.24)
GFR calc Af Amer: 8 mL/min — ABNORMAL LOW (ref >60)
GFR calc non Af Amer: 7 mL/min — ABNORMAL LOW (ref >60)
Glucose, Bld: 106 mg/dL — ABNORMAL HIGH (ref 70–99)
Potassium: 3.8 mmol/L (ref 3.5–5.1)
Sodium: 136 mmol/L (ref 135–145)
Total Bilirubin: 0.2 mg/dL — ABNORMAL LOW (ref 0.3–1.2)
Total Protein: 8.6 g/dL — ABNORMAL HIGH (ref 6.5–8.1)

## 2018-05-03 LAB — CDS SEROLOGY

## 2018-05-03 LAB — RAPID URINE DRUG SCREEN, HOSP PERFORMED
Amphetamines: NOT DETECTED
Barbiturates: NOT DETECTED
Benzodiazepines: NOT DETECTED
Cocaine: NOT DETECTED
Opiates: NOT DETECTED
Tetrahydrocannabinol: NOT DETECTED

## 2018-05-03 LAB — ETHANOL: Alcohol, Ethyl (B): <10 mg/dL (ref <10)

## 2018-05-03 LAB — SAMPLE TO BLOOD BANK

## 2018-05-03 LAB — LACTATE DEHYDROGENASE: LDH: 145 U/L (ref 98–192)

## 2018-05-03 LAB — PROTIME-INR
INR: 1.2 (ref 0.8–1.2)
Prothrombin Time: 14.7 seconds (ref 11.4–15.2)

## 2018-05-03 LAB — FERRITIN: Ferritin: 539 ng/mL — ABNORMAL HIGH (ref 24–336)

## 2018-05-03 LAB — PROCALCITONIN: Procalcitonin: 0.6 ng/mL

## 2018-05-03 LAB — AMMONIA: Ammonia: 23 umol/L (ref 9–35)

## 2018-05-03 LAB — TROPONIN I: Troponin I: <0.03 ng/mL (ref <0.03)

## 2018-05-03 LAB — C-REACTIVE PROTEIN: CRP: 1.8 mg/dL — ABNORMAL HIGH (ref <1.0)

## 2018-05-03 LAB — CK TOTAL AND CKMB (NOT AT ARMC)
CK, MB: 1.6 ng/mL (ref 0.5–5.0)
Relative Index: 1.4 (ref 0.0–2.5)
Total CK: 112 U/L (ref 49–397)

## 2018-05-03 MED ORDER — SODIUM CHLORIDE 0.9 % IV SOLN
INTRAVENOUS | Status: AC
  Administered 2018-05-03: 23:00:00 via INTRAVENOUS

## 2018-05-03 MED ORDER — SODIUM BICARBONATE 650 MG PO TABS
650.0000 mg | ORAL_TABLET | Freq: Two times a day (BID) | ORAL | Status: DC
  Administered 2018-05-04 – 2018-05-06 (×6): 650 mg via ORAL
  Filled 2018-05-03 (×6): qty 1

## 2018-05-03 MED ORDER — LORAZEPAM 2 MG/ML IJ SOLN: 1.0000 mg | Freq: Four times a day (QID) | INTRAMUSCULAR | Status: DC | PRN

## 2018-05-03 MED ORDER — ACETAMINOPHEN 325 MG PO TABS: 650.0000 mg | ORAL_TABLET | Freq: Four times a day (QID) | ORAL | Status: DC | PRN

## 2018-05-03 MED ORDER — ADULT MULTIVITAMIN W/MINERALS CH
1.0000 | ORAL_TABLET | Freq: Every day | ORAL | Status: DC
  Administered 2018-05-04 – 2018-05-06 (×4): 1 via ORAL
  Filled 2018-05-03 (×4): qty 1

## 2018-05-03 MED ORDER — SODIUM CHLORIDE 0.9 % IV SOLN
1.0000 g | INTRAVENOUS | Status: DC
  Administered 2018-05-04 – 2018-05-05 (×3): 1 g via INTRAVENOUS
  Filled 2018-05-03 (×4): qty 10

## 2018-05-03 MED ORDER — LORAZEPAM 1 MG PO TABS: 1.0000 mg | ORAL_TABLET | Freq: Four times a day (QID) | ORAL | Status: DC | PRN

## 2018-05-03 MED ORDER — FOLIC ACID 1 MG PO TABS
1.0000 mg | ORAL_TABLET | Freq: Every day | ORAL | Status: DC
  Administered 2018-05-04 – 2018-05-06 (×4): 1 mg via ORAL
  Filled 2018-05-03 (×4): qty 1

## 2018-05-03 MED ORDER — VITAMIN B-1 100 MG PO TABS
100.0000 mg | ORAL_TABLET | Freq: Every day | ORAL | Status: DC
  Administered 2018-05-04 – 2018-05-06 (×4): 100 mg via ORAL
  Filled 2018-05-03 (×4): qty 1

## 2018-05-03 MED ORDER — CALCITRIOL 0.25 MCG PO CAPS
0.5000 ug | ORAL_CAPSULE | Freq: Every day | ORAL | Status: DC
  Administered 2018-05-04 – 2018-05-06 (×3): 0.5 ug via ORAL
  Filled 2018-05-03 (×3): qty 2

## 2018-05-03 MED ORDER — LORAZEPAM 2 MG/ML IJ SOLN
2.0000 mg | Freq: Once | INTRAMUSCULAR | Status: AC
  Administered 2018-05-03: 19:00:00 2 mg via INTRAVENOUS
  Filled 2018-05-03: qty 1

## 2018-05-03 MED ORDER — THIAMINE HCL 100 MG/ML IJ SOLN: 100.0000 mg | Freq: Every day | INTRAMUSCULAR | Status: DC

## 2018-05-03 MED ORDER — HEPARIN SODIUM (PORCINE) 5000 UNIT/ML IJ SOLN
5000.0000 [IU] | Freq: Three times a day (TID) | INTRAMUSCULAR | Status: DC
  Administered 2018-05-04 – 2018-05-06 (×9): 5000 [IU] via SUBCUTANEOUS
  Filled 2018-05-03 (×9): qty 1

## 2018-05-03 NOTE — H&P (Signed)
History and Physical    Matthew [name] QSX:[id number] DOB: 09-03-1950 DOA: 05/03/2018  PCP: Wendie Agreste, MD Patient coming from: Home  Chief Complaint: Hands shaking  HPI: Matthew [name] is a 68 y.o. male with medical history significant of type 2 diabetes, CKD stage IV, hypertension presenting to the hospital via EMS after being found down by family at home near a car.  Fall was unwitnessed.  Per EMS, temperature 100.2 F. Patient is a poor historian and limited history could be obtained from him.  States his hands have been shaking all day today.  He had 2 beers this morning.  States he was walking on his porch and then felt lightheaded and blacked out.  Denies having any chest pain or shortness of breath prior to the episode of syncope.  Denies having any fevers or chills.  Denies any cough, abdominal pain, blood in stool, or dysuria.  States he has a Foley catheter and has been having good urine output.  No additional history could be obtained from the patient.  Patient was recently admitted from March 30 to April 5 for AKI on CKD stage IV, metabolic acidosis, and acute UTI due to recurrent self-catheterization.  Patient received IV fluids and his creatinine improved from 13 to 7 range during this hospitalization.  He was discharged home with an indwelling Foley catheter with plan to follow-up with nephrology in 3 days and urology in 2 to 3 weeks.  He was treated with IV Rocephin for his UTI and cultures grew strep, placed on Augmentin with plan to treat as complicated UTI for a total of 10 days.  Patient also had symptomatic anemia due to chronic renal failure with negative Hemoccult.  He was transfused with 2 units PRBCs during this hospitalization.  Review of Systems: As per HPI otherwise 10 point review of systems negative.  Past Medical History:  Diagnosis Date   Diabetes ([place])    ESRD (end stage renal disease) ([place]) 04/26/2018   Essential hypertension     Past Surgical  History:  Procedure Laterality Date   finger lanced       reports that he quit smoking about 21 years ago. His smoking use included cigarettes. He has a 0.25 pack-year smoking history. He has never used smokeless tobacco. He reports current alcohol use of about 10.0 - 12.0 standard drinks of alcohol per week. He reports that he does not use drugs.  No Known Allergies  Family History  Problem Relation Age of Onset   Hypertension Mother    Migraines Mother    Heart disease Mother     Prior to Admission medications   Medication Sig Start Date End Date Taking? Authorizing Provider  amoxicillin-clavulanate (AUGMENTIN) 500-125 MG tablet Take 1 tablet (500 mg total) by mouth daily. 05/03/18   Caren Griffins, MD  calcitRIOL (ROCALTROL) 0.5 MCG capsule Take 1 capsule (0.5 mcg total) by mouth daily. 05/03/18   Caren Griffins, MD  sodium bicarbonate 650 MG tablet Take 1 tablet (650 mg total) by mouth 2 (two) times daily. 05/02/18   Caren Griffins, MD    Physical Exam: Vitals:   05/03/18 2115 05/03/18 2140 05/03/18 2145 05/03/18 2325  BP: 105/71  129/80 136/76  Pulse:    79  Resp: _0 Temp:   99 F (37.2 C) 98.3 F (36.8 C)  TempSrc:   Oral Oral  SpO2:   100% 99%  Weight:  72.4 kg    Height:  5'  10" (1.778 m)      Physical Exam  Constitutional: He appears well-developed and well-nourished. No distress.  HENT:  Small abrasion to the left lateral orbital area  Eyes: Right eye exhibits no discharge. Left eye exhibits no discharge.  Neck: Neck supple.  Cardiovascular: Normal rate, regular rhythm and intact distal pulses.  Pulmonary/Chest: Effort normal and breath sounds normal. No respiratory distress. He has no wheezes. He has no rales.  Abdominal: Soft. Bowel sounds are normal. He exhibits no distension. There is no abdominal tenderness. There is no guarding.  Musculoskeletal:        General: No edema.  Neurological: He is alert.  Awake and alert.  Oriented to person  and place only. Speech fluent and no facial droop. Moving all extremities spontaneously. Bilateral asterixis  Skin: Skin is warm and dry. He is not diaphoretic.     Labs on Admission: I have personally reviewed following labs and imaging studies  CBC: Recent Labs  Lab 04/29/18 0541 04/30/18 0619 04/30/18 1603 05/01/18 0443 05/03/18 1623  WBC 6.5 5.5  --  7.5 12.5*  NEUTROABS  --   --   --   --  9.3*  HGB 7.1* 6.7* 7.9* 8.0* 8.8*  HCT 21.4* 21.0* 24.3* 25.0* 29.1*  MCV 82.6 86.1  --  85.3 89.3  PLT 328 300  --  296 622   Basic Metabolic Panel: Recent Labs  Lab 04/29/18 0542 04/30/18 0619 05/01/18 0443 05/02/18 0630 05/03/18 1623  NA 136 136 137 137 136  K 3.1* 3.3* 3.6 4.1 3.8  CL 100 100 102 103 102  CO2 24 21* 22 25 21*  GLUCOSE 97 95 107* 94 106*  BUN 111* 97* 91* 84* 79*  CREATININE 10.65* 9.27* 8.34* 7.39* 7.31*  CALCIUM 6.2* 6.1* 6.1* 6.7* 7.0*  PHOS 5.2* 4.4 3.6 3.9  --    GFR: Estimated Creatinine Clearance: 10 mL/min (A) (by C-G formula based on SCr of 7.31 mg/dL (H)). Liver Function Tests: Recent Labs  Lab 04/29/18 0542 04/30/18 0619 05/01/18 0443 05/02/18 0630 05/03/18 1623  AST  --   --   --   --  20  ALT  --   --   --   --  16  ALKPHOS  --   --   --   --  39  BILITOT  --   --   --   --  0.2*  PROT  --   --   --   --  8.6*  ALBUMIN 2.0* 2.0* 2.0* 2.0* 2.4*   No results for input(s): LIPASE, AMYLASE in the last 168 hours. Recent Labs  Lab 05/03/18 1926  AMMONIA 23   Coagulation Profile: Recent Labs  Lab 05/03/18 1623  INR 1.2   Cardiac Enzymes: Recent Labs  Lab 05/03/18 1623 05/03/18 2149  CKTOTAL 112  --   CKMB 1.6  --   TROPONINI  --  <0.03   BNP (last 3 results) Recent Labs    07/31/17 1540  PROBNP 93.0   HbA1C: No results for input(s): HGBA1C in the last 72 hours. CBG: No results for input(s): GLUCAP in the last 168 hours. Lipid Profile: No results for input(s): CHOL, HDL, LDLCALC, TRIG, CHOLHDL, LDLDIRECT in  the last 72 hours. Thyroid Function Tests: No results for input(s): TSH, T4TOTAL, FREET4, T3FREE, THYROIDAB in the last 72 hours. Anemia Panel: Recent Labs    05/03/18 2149  FERRITIN 539*   Urine analysis:    Component Value Date/Time  COLORURINE YELLOW 05/03/2018 1715   APPEARANCEUR HAZY (A) 05/03/2018 1715   LABSPEC 1.011 05/03/2018 1715   PHURINE 6.0 05/03/2018 [place] 05/03/2018 1715   HGBUR LARGE (A) 05/03/2018 [place] 05/03/2018 1715   BILIRUBINUR negative 04/26/2018 [place] 05/03/2018 1715   PROTEINUR 100 (A) 05/03/2018 1715   UROBILINOGEN 0.2 04/26/2018 1049   NITRITE NEGATIVE 05/03/2018 1715   LEUKOCYTESUR LARGE (A) 05/03/2018 1715    Radiological Exams on Admission: Ct Head Wo Contrast  Result Date: 05/03/2018 CLINICAL DATA:  Laceration to the left side of the face. Confusion. Found on the ground. EXAM: CT HEAD WITHOUT CONTRAST CT CERVICAL SPINE WITHOUT CONTRAST TECHNIQUE: Multidetector CT imaging of the head and cervical spine was performed following the standard protocol without intravenous contrast. Multiplanar CT image reconstructions of the cervical spine were also generated. COMPARISON:  04/26/2018 FINDINGS: CT HEAD FINDINGS Brain: Generalized atrophy. Chronic small-vessel ischemic changes of the white matter. No sign of acute infarction, mass lesion, hemorrhage, hydrocephalus or extra-axial collection. Physiologic calcification of the basal ganglia. Vascular: No abnormal vascular finding. Skull: No skull fracture. Sinuses/Orbits: Clear/normal Other: Left cheek soft tissue swelling. CT CERVICAL SPINE FINDINGS Alignment: No traumatic malalignment. Skull base and vertebrae: No fracture or primary lesion. Chronic fusion at C5-6. Soft tissues and spinal canal: Negative Disc levels: Facet osteoarthritis on the right at C3-4 and C6-7 and on the left at C2-3, and C4-5. No apparent compressive canal or foraminal stenosis.  Upper chest: Bullous emphysema at the left apex without visible lung markings. No change from previous. Other: None IMPRESSION: Head CT: No acute or traumatic finding. Mild atrophy and chronic small-vessel change of the white matter. Cervical spine CT: No acute or traumatic finding. Chronic fusion C5-6. Chronic degenerative spondylosis and facet arthropathy. Large bulla at the left lung apex as seen previously. Electronically Signed   By: Nelson Chimes M.D.   On: 05/03/2018 17:28   Ct Cervical Spine Wo Contrast  Result Date: 05/03/2018 CLINICAL DATA:  Laceration to the left side of the face. Confusion. Found on the ground. EXAM: CT HEAD WITHOUT CONTRAST CT CERVICAL SPINE WITHOUT CONTRAST TECHNIQUE: Multidetector CT imaging of the head and cervical spine was performed following the standard protocol without intravenous contrast. Multiplanar CT image reconstructions of the cervical spine were also generated. COMPARISON:  04/26/2018 FINDINGS: CT HEAD FINDINGS Brain: Generalized atrophy. Chronic small-vessel ischemic changes of the white matter. No sign of acute infarction, mass lesion, hemorrhage, hydrocephalus or extra-axial collection. Physiologic calcification of the basal ganglia. Vascular: No abnormal vascular finding. Skull: No skull fracture. Sinuses/Orbits: Clear/normal Other: Left cheek soft tissue swelling. CT CERVICAL SPINE FINDINGS Alignment: No traumatic malalignment. Skull base and vertebrae: No fracture or primary lesion. Chronic fusion at C5-6. Soft tissues and spinal canal: Negative Disc levels: Facet osteoarthritis on the right at C3-4 and C6-7 and on the left at C2-3, and C4-5. No apparent compressive canal or foraminal stenosis. Upper chest: Bullous emphysema at the left apex without visible lung markings. No change from previous. Other: None IMPRESSION: Head CT: No acute or traumatic finding. Mild atrophy and chronic small-vessel change of the white matter. Cervical spine CT: No acute or  traumatic finding. Chronic fusion C5-6. Chronic degenerative spondylosis and facet arthropathy. Large bulla at the left lung apex as seen previously. Electronically Signed   By: Nelson Chimes M.D.   On: 05/03/2018 17:28   Dg Chest Port 1 View  Result Date: 05/03/2018 CLINICAL DATA:  Fall.  Fever. EXAM: PORTABLE CHEST 1 VIEW COMPARISON:  Chest radiograph and chest CT April 26, 2018 FINDINGS: There is a stable large bulla in the left upper lobe. There is no appreciable edema or consolidation. Heart is upper normal in size with pulmonary vascularity normal. No adenopathy. No bone lesions. IMPRESSION: Large left upper lobe bulla. No edema or consolidation. Stable cardiac silhouette. Electronically Signed   By: Lowella Grip III M.D.   On: 05/03/2018 17:51    EKG: Independently reviewed.  Sinus tachycardia (heart rate 100), artifact.  Assessment/Plan Principal Problem:   Syncope Active Problems:   Anemia, chronic renal failure, stage 4 (severe) (HCC)   Fall   Acute metabolic encephalopathy   Suspected Covid-19 Virus Infection   Syncope, unwitnessed fall -Possibly orthostatic as patient reports feeling lightheaded before his syncopal episode.  Troponin negative and EKG not suggestive of ACS.  CK normal.  CT head and C-spine negative for acute finding. -IV fluid hydration -Check orthostatics -Echocardiogram -Cardiac monitoring -EEG  Acute metabolic encephalopathy -Possibly related to infection.  Oriented to person and place only.  Blood ethanol level negative.  UDS negative.  Noted to have asterixis on exam, however, ammonia level normal.  Head CT negative for acute finding. -Treatment of UTI as mentioned below -Continue to monitor  Fever; rule out COVID-19 -Temperature 100.2 recorded by EMS.  Patient is afebrile here.  White count 12.5 with slight neutrophilic predominance.  Lactic acid normal.  Procalcitonin 0.60.  Ferritin 539.  ESR 125.  CRP 1.8.  LDH 145.  Troponin negative.  Chest  x-ray without infiltrates. -Testing for SARS-CoV-2 ordered  -Droplet and contact precautions -Continue to monitor CBC  Recent AKI on CKD stage IV, metabolic acidosis -Creatinine 7.3, stable since hospital discharge 2 days ago.  Bicarb 21. -Continue to monitor BMP -Continue home calcitriol and sodium bicarbonate  UTI due to recurrent self-catheterization -UA showing large amount of leukocytes, greater than 50 RBCs, greater than 50 WBCs, and rare bacteria.  Cultures done during recent hospitalization growing group B strep (strep agalactiae).  -Ceftriaxone -Repeat urine culture  Chronic urinary retention -Followed by [facility].  Currently has a Foley in place. Patient will need urology follow-up.  Monitor urine output.  Anemia in the setting of chronic renal failure -Hemoglobin 8.8, stable since recent hospital discharge. -Continue to monitor CBC  Alcohol use Blood ethanol level negative. -CIWA monitoring; Ativan PRN -Thiamine, folate, multivitamin  Type 2 diabetes -Check A1c.  Sliding scale insulin and CBG checks.  DVT prophylaxis: Subcutaneous heparin Code Status: Full code Family Communication: No family available. Disposition Plan: Anticipate discharge after clinical improvement. Consults called: None Admission status: Observation, telemetry  This chart was dictated using voice recognition software.  Despite best efforts to proofread, errors can occur which can change the documentation meaning.  Shela Leff MD [facility] Pager [phone]  If 7PM-7AM, please contact night-coverage [email or web address] Password [email or web address]  05/04/2018, 2:45 AM

## 2018-05-03 NOTE — ED Triage Notes (Signed)
[facility]- pt was recently discharged yesterday due to kidney failure. Pt here due to an unwitnessed fall outside of house. Family found pt on the ground. Pt is only oriented to name. EMS reports fever of 100.2. pt is also having tremors in the left arm which isn't normal. Pt is A&Ox4 at baseline.   CBG 121

## 2018-05-03 NOTE — ED Notes (Signed)
ED TO INPATIENT HANDOFF REPORT  ED Nurse Name and Phone #: Sherrine Maples [phone]  S Name/Age/Gender Donzetta Kohut 68 y.o. male Room/Bed: 017C/017C  Code Status   Code Status: Prior  Home/SNF/Other Home Patient oriented to: self, place Is this baseline? No   Triage Complete: Triage complete  Chief Complaint fever/fall  Triage Note [facility]- pt was recently discharged yesterday due to kidney failure. Pt here due to an unwitnessed fall outside of house. Family found pt on the ground. Pt is only oriented to name. EMS reports fever of 100.2. pt is also having tremors in the left arm which isn't normal. Pt is A&Ox4 at baseline.   CBG 121   Allergies No Known Allergies  Level of Care/Admitting Diagnosis ED Disposition    ED Disposition Condition Comment   Admit  Hospital Area: [facility] [[id number]]  Level of Care: Telemetry Medical [104]  I expect the patient will be discharged within 24 hours: No (not a candidate for 5C-Observation unit)  Diagnosis: Fall [290176]  Admitting Physician: Shela Leff [[id number]]  Attending Physician: Shela Leff [[id number]]  Bed request comments: COVID-19 rule out; droplet and contact precautions  PT Class (Do Not Modify): Observation [104]  PT Acc Code (Do Not Modify): Observation [10022]       B Medical/Surgery History Past Medical History:  Diagnosis Date  . Diabetes ([place])   . ESRD (end stage renal disease) ([place]) 04/26/2018  . Essential hypertension    Past Surgical History:  Procedure Laterality Date  . finger lanced       A IV Location/Drains/Wounds Patient Lines/Drains/Airways Status   Active Line/Drains/Airways    Name:   Placement date:   Placement time:   Site:   Days:   Peripheral IV 04/26/18 Left Forearm   04/26/18    1539    Forearm   7   Peripheral IV 05/03/18 Left Antecubital   05/03/18    1650    Antecubital   less than 1   Peripheral IV 05/03/18 Left Forearm   05/03/18    1650    Forearm    less than 1   Urethral Catheter C Simpson, RN Non-latex 16 Fr.   04/26/18    1433    Non-latex   7          Intake/Output Last 24 hours No intake or output data in the 24 hours ending 05/03/18 2025  Labs/Imaging Results for orders placed or performed during the hospital encounter of 05/03/18 (from the past 48 hour(s))  Comprehensive metabolic panel     Status: Abnormal   Collection Time: 05/03/18  4:23 PM  Result Value Ref Range   Sodium 136 135 - 145 mmol/L   Potassium 3.8 3.5 - 5.1 mmol/L   Chloride 102 98 - 111 mmol/L   CO2 21 (L) 22 - 32 mmol/L   Glucose, Bld 106 (H) 70 - 99 mg/dL   BUN 79 (H) 8 - 23 mg/dL   Creatinine, Ser 7.31 (H) 0.61 - 1.24 mg/dL   Calcium 7.0 (L) 8.9 - 10.3 mg/dL   Total Protein 8.6 (H) 6.5 - 8.1 g/dL   Albumin 2.4 (L) 3.5 - 5.0 g/dL   AST 20 15 - 41 U/L   ALT 16 0 - 44 U/L   Alkaline Phosphatase 39 38 - 126 U/L   Total Bilirubin 0.2 (L) 0.3 - 1.2 mg/dL   GFR calc non Af Amer 7 (L) >60 mL/min   GFR calc Af Amer 8 (L) >60  mL/min   Anion gap 13 5 - 15    Comment: Performed at [facility] [address] [place], [place] [address]  Ethanol     Status: None   Collection Time: 05/03/18  4:23 PM  Result Value Ref Range   Alcohol, Ethyl (B) <10 <10 mg/dL    Comment: (NOTE) Lowest detectable limit for serum alcohol is 10 mg/dL. For medical purposes only. Performed at [facility], [place] [address], [place], [place] [address]   Lactic acid, plasma     Status: None   Collection Time: 05/03/18  4:23 PM  Result Value Ref Range   Lactic Acid, Venous 1.1 0.5 - 1.9 mmol/L    Comment: Performed at [facility] [address], [place], [place] [address]  Protime-INR     Status: None   Collection Time: 05/03/18  4:23 PM  Result Value Ref Range   Prothrombin Time 14.7 11.4 - 15.2 seconds   INR 1.2 0.8 - 1.2    Comment: (NOTE) INR goal varies based on device and disease states. Performed at [facility], [place] [address]  [place], [place] [address]   CK total and CKMB     Status: None   Collection Time: 05/03/18  4:23 PM  Result Value Ref Range   Total CK 112 49 - 397 U/L   CK, MB 1.6 0.5 - 5.0 ng/mL   Relative Index 1.4 0.0 - 2.5    Comment: Performed at [facility] [address] [place], [place] [address]  CBC WITH DIFFERENTIAL     Status: Abnormal   Collection Time: 05/03/18  4:23 PM  Result Value Ref Range   WBC 12.5 (H) 4.0 - 10.5 K/uL   RBC 3.26 (L) 4.22 - 5.81 MIL/uL   Hemoglobin 8.8 (L) 13.0 - 17.0 g/dL   HCT 29.1 (L) 39.0 - 52.0 %   MCV 89.3 80.0 - 100.0 fL   MCH 27.0 26.0 - 34.0 pg   MCHC 30.2 30.0 - 36.0 g/dL   RDW 15.8 (H) 11.5 - 15.5 %   Platelets 298 150 - 400 K/uL   nRBC 0.0 0.0 - 0.2 %   Neutrophils Relative % 73 %   Neutro Abs 9.3 (H) 1.7 - 7.7 K/uL   Lymphocytes Relative 16 %   Lymphs Abs 1.9 0.7 - 4.0 K/uL   Monocytes Relative 7 %   Monocytes Absolute 0.8 0.1 - 1.0 K/uL   Eosinophils Relative 2 %   Eosinophils Absolute 0.3 0.0 - 0.5 K/uL   Basophils Relative 1 %   Basophils Absolute 0.1 0.0 - 0.1 K/uL   Immature Granulocytes 1 %   Abs Immature Granulocytes 0.07 0.00 - 0.07 K/uL    Comment: Performed at [facility], [place] [address], [place], [place] [address]  CDS serology     Status: None   Collection Time: 05/03/18  5:00 PM  Result Value Ref Range   CDS serology specimen      SPECIMEN WILL BE HELD FOR 14 DAYS IF TESTING IS REQUIRED    Comment: SPECIMEN WILL BE HELD FOR 14 DAYS IF TESTING IS REQUIRED SPECIMEN WILL BE HELD FOR 14 DAYS IF TESTING IS REQUIRED Performed at [facility], [place] [address] [place],  [address]   Urinalysis, Routine w reflex microscopic     Status: Abnormal   Collection Time: 05/03/18  5:15 PM  Result Value Ref Range   Color, Urine  YELLOW YELLOW   APPearance HAZY (A) CLEAR   Specific Gravity, Urine 1.011 1.005 - 1.030   pH 6.0 5.0 - 8.0   Glucose, UA NEGATIVE NEGATIVE mg/dL   Hgb urine dipstick LARGE (A) NEGATIVE    Bilirubin Urine NEGATIVE NEGATIVE   Ketones, ur NEGATIVE NEGATIVE mg/dL   Protein, ur 100 (A) NEGATIVE mg/dL   Nitrite NEGATIVE NEGATIVE   Leukocytes,Ua LARGE (A) NEGATIVE   RBC / HPF >50 (H) 0 - 5 RBC/hpf   WBC, UA >50 (H) 0 - 5 WBC/hpf   Bacteria, UA RARE (A) NONE SEEN    Comment: Performed at [facility] [address] [place], [place] [address]  Urine rapid drug screen (hosp performed)     Status: None   Collection Time: 05/03/18  5:15 PM  Result Value Ref Range   Opiates NONE DETECTED NONE DETECTED   Cocaine NONE DETECTED NONE DETECTED   Benzodiazepines NONE DETECTED NONE DETECTED   Amphetamines NONE DETECTED NONE DETECTED   Tetrahydrocannabinol NONE DETECTED NONE DETECTED   Barbiturates NONE DETECTED NONE DETECTED    Comment: (NOTE) DRUG SCREEN FOR MEDICAL PURPOSES ONLY.  IF CONFIRMATION IS NEEDED FOR ANY PURPOSE, NOTIFY LAB WITHIN 5 DAYS. LOWEST DETECTABLE LIMITS FOR URINE DRUG SCREEN Drug Class                     Cutoff (ng/mL) Amphetamine and metabolites    1000 Barbiturate and metabolites    200 Benzodiazepine                 175 Tricyclics and metabolites     300 Opiates and metabolites        300 Cocaine and metabolites        300 THC                            50 Performed at [facility], [place] [address], [place], [place] [address]   Ammonia     Status: None   Collection Time: 05/03/18  7:26 PM  Result Value Ref Range   Ammonia 23 9 - 35 umol/L    Comment: Performed at [facility], [place] [address], [place], [place] [address]   Ct Head Wo Contrast  Result Date: 05/03/2018 CLINICAL DATA:  Laceration to the left side of the face. Confusion. Found on the ground. EXAM: CT HEAD WITHOUT CONTRAST CT CERVICAL SPINE WITHOUT CONTRAST TECHNIQUE: Multidetector CT imaging of the head and cervical spine was performed following the standard protocol without intravenous contrast. Multiplanar CT image reconstructions of the cervical spine were also  generated. COMPARISON:  04/26/2018 FINDINGS: CT HEAD FINDINGS Brain: Generalized atrophy. Chronic small-vessel ischemic changes of the white matter. No sign of acute infarction, mass lesion, hemorrhage, hydrocephalus or extra-axial collection. Physiologic calcification of the basal ganglia. Vascular: No abnormal vascular finding. Skull: No skull fracture. Sinuses/Orbits: Clear/normal Other: Left cheek soft tissue swelling. CT CERVICAL SPINE FINDINGS Alignment: No traumatic malalignment. Skull base and vertebrae: No fracture or primary lesion. Chronic fusion at C5-6. Soft tissues and spinal canal: Negative Disc levels: Facet osteoarthritis on the right at C3-4 and C6-7 and on the left at C2-3, and C4-5. No apparent compressive canal or foraminal stenosis. Upper chest: Bullous emphysema at the left apex without visible lung markings. No change from previous. Other: None IMPRESSION: Head CT: No acute or traumatic finding. Mild atrophy and chronic small-vessel change of the white  matter. Cervical spine CT: No acute or traumatic finding. Chronic fusion C5-6. Chronic degenerative spondylosis and facet arthropathy. Large bulla at the left lung apex as seen previously. Electronically Signed   By: Nelson Chimes M.D.   On: 05/03/2018 17:28   Ct Cervical Spine Wo Contrast  Result Date: 05/03/2018 CLINICAL DATA:  Laceration to the left side of the face. Confusion. Found on the ground. EXAM: CT HEAD WITHOUT CONTRAST CT CERVICAL SPINE WITHOUT CONTRAST TECHNIQUE: Multidetector CT imaging of the head and cervical spine was performed following the standard protocol without intravenous contrast. Multiplanar CT image reconstructions of the cervical spine were also generated. COMPARISON:  04/26/2018 FINDINGS: CT HEAD FINDINGS Brain: Generalized atrophy. Chronic small-vessel ischemic changes of the white matter. No sign of acute infarction, mass lesion, hemorrhage, hydrocephalus or extra-axial collection. Physiologic calcification of  the basal ganglia. Vascular: No abnormal vascular finding. Skull: No skull fracture. Sinuses/Orbits: Clear/normal Other: Left cheek soft tissue swelling. CT CERVICAL SPINE FINDINGS Alignment: No traumatic malalignment. Skull base and vertebrae: No fracture or primary lesion. Chronic fusion at C5-6. Soft tissues and spinal canal: Negative Disc levels: Facet osteoarthritis on the right at C3-4 and C6-7 and on the left at C2-3, and C4-5. No apparent compressive canal or foraminal stenosis. Upper chest: Bullous emphysema at the left apex without visible lung markings. No change from previous. Other: None IMPRESSION: Head CT: No acute or traumatic finding. Mild atrophy and chronic small-vessel change of the white matter. Cervical spine CT: No acute or traumatic finding. Chronic fusion C5-6. Chronic degenerative spondylosis and facet arthropathy. Large bulla at the left lung apex as seen previously. Electronically Signed   By: Nelson Chimes M.D.   On: 05/03/2018 17:28   Dg Chest Port 1 View  Result Date: 05/03/2018 CLINICAL DATA:  Fall.  Fever. EXAM: PORTABLE CHEST 1 VIEW COMPARISON:  Chest radiograph and chest CT April 26, 2018 FINDINGS: There is a stable large bulla in the left upper lobe. There is no appreciable edema or consolidation. Heart is upper normal in size with pulmonary vascularity normal. No adenopathy. No bone lesions. IMPRESSION: Large left upper lobe bulla. No edema or consolidation. Stable cardiac silhouette. Electronically Signed   By: Lowella Grip III M.D.   On: 05/03/2018 17:51    Pending Labs Unresulted Labs (From admission, onward)    Start     Ordered   05/03/18 1625  Urine culture  ONCE - STAT,   STAT     05/03/18 1637   05/03/18 1623  Sample to Blood Bank  (Trauma Panel)  ONCE - STAT,   STAT     05/03/18 1637          Vitals/Pain Today's Vitals   05/03/18 1906 05/03/18 1915 05/03/18 1945 05/03/18 2015  BP: 122/69 113/81 118/80 118/77  Pulse: 97 89    Resp: 16 13 (!) 21  17  Temp:      TempSrc:      SpO2: 100% 100%    Weight:      Height:      PainSc:        Isolation Precautions Droplet and Contact precautions  Medications Medications  LORazepam (ATIVAN) injection 2 mg (2 mg Intravenous Given 05/03/18 1844)    Mobility walks High fall risk   Focused Assessments Pulmonary Assessment Handoff:  Lung sounds:   O2 Device: Room Air        R Recommendations: See Admitting Provider Note  Report given to:   Additional Notes:

## 2018-05-03 NOTE — ED Notes (Signed)
Attempted report x 2 

## 2018-05-03 NOTE — ED Notes (Signed)
Per MD Schlossman, foley cath will not be changed at this time in th ED.

## 2018-05-03 NOTE — ED Notes (Signed)
Attempted report x1. 

## 2018-05-03 NOTE — ED Notes (Signed)
Patient transported to CT 

## 2018-05-03 NOTE — ED Provider Notes (Signed)
[facility] EMERGENCY DEPARTMENT Provider Note   CSN: [id number] Arrival date & time: 05/03/18  1604    History   Chief Complaint No chief complaint on file.   HPI Matthew [name] is a 68 y.o. male.     HPI Matthew Foxworthis a 68 y.o.malewith medical history significant ofurinary retention; stage IV CKD with anemia presenting being found down by family at home near car.  Unwitnessed.  Not complaining of pain.  Patient confused with EMS though stable otherwise.  Patient does not remember tripping or any events leading up to being found on the ground.  Believes it is the year 2000.  Does not know where we are but does know his name.   He had a Foley catheter placed while hospitalized, received IV fluids and his creatinine has improved from 13 into the 7 range over about 4 days.   "Acute UTI due to recurrent self-catheterization -Initially placed on IV Rocephin however cultures showed strep, placed on Augmentin, plan to treat as complicated UTIfor 10 days with 7 remaining upon discharge" - per DC summary yesterday  I spoke to his emergency contact who validates the above story and states he was in his normal state of health this morning just prior to being found down.  Does not believe he was down for more than a few minutes.   Past Medical History:  Diagnosis Date  . Diabetes ([place])   . ESRD (end stage renal disease) ([place]) 04/26/2018  . Essential hypertension     Patient Active Problem List   Diagnosis Date Noted  . Acute uremia 04/27/2018  . AKI (acute kidney injury) ([place]) 04/27/2018  . Acute on chronic renal failure ([place]) 04/26/2018  . Alcohol abuse 04/26/2018  . Symptomatic anemia 04/26/2018  . Metabolic acidosis 74/08/1446  . Acute lower UTI 04/26/2018  . Chronic renal failure, stage 4 (severe) ([place]) 08/01/2017  . Anemia, chronic renal failure, stage 4 (severe) ([place]) 08/01/2017  . DOE (dyspnea on exertion) 07/31/2017    Past Surgical History:   Procedure Laterality Date  . finger lanced          Home Medications    Prior to Admission medications   Medication Sig Start Date End Date Taking? Authorizing Provider  amoxicillin-clavulanate (AUGMENTIN) 500-125 MG tablet Take 1 tablet (500 mg total) by mouth daily. 05/03/18   Caren Griffins, MD  calcitRIOL (ROCALTROL) 0.5 MCG capsule Take 1 capsule (0.5 mcg total) by mouth daily. 05/03/18   Caren Griffins, MD  sodium bicarbonate 650 MG tablet Take 1 tablet (650 mg total) by mouth 2 (two) times daily. 05/02/18   Caren Griffins, MD    Family History Family History  Problem Relation Age of Onset  . Hypertension Mother   . Migraines Mother   . Heart disease Mother     Social History Social History   Tobacco Use  . Smoking status: Former Smoker    Packs/day: 0.25    Years: 1.00    Pack years: 0.25    Types: Cigarettes    Last attempt to quit: 01/27/1997    Years since quitting: 21.2  . Smokeless tobacco: Never Used  Substance Use Topics  . Alcohol use: Yes    Alcohol/week: 10.0 - 12.0 standard drinks    Types: 10 - 12 Cans of beer per week    Comment: "I mean I drinks, but not like that", denies every day  . Drug use: No     Allergies  Patient has no known allergies.   Review of Systems Review of Systems  Constitutional: Negative for chills and fever.  HENT: Negative for ear pain and sore throat.   Eyes: Negative for pain and visual disturbance.  Respiratory: Negative for cough and shortness of breath.   Cardiovascular: Negative for chest pain and palpitations.  Gastrointestinal: Negative for abdominal pain and vomiting.  Genitourinary: Negative for dysuria and hematuria.  Musculoskeletal: Negative for arthralgias and back pain.  Skin: Negative for color change and rash.  Neurological: Negative for seizures, syncope, weakness, light-headedness and headaches.  All other systems reviewed and are negative.    Physical Exam Updated Vital Signs There  were no vitals taken for this visit.  Physical Exam Vitals signs and nursing note reviewed.  Constitutional:      Appearance: He is well-developed. He is not ill-appearing or diaphoretic.  HENT:     Head: Normocephalic.     Comments: Abrasion to left lateral orbit    Right Ear: External ear normal.     Left Ear: External ear normal.     Nose: No congestion or rhinorrhea.     Mouth/Throat:     Mouth: Mucous membranes are moist.     Pharynx: No oropharyngeal exudate or posterior oropharyngeal erythema.  Eyes:     General:        Right eye: No discharge.        Left eye: No discharge.     Extraocular Movements: Extraocular movements intact.     Conjunctiva/sclera: Conjunctivae normal.     Pupils: Pupils are equal, round, and reactive to light.  Neck:     Musculoskeletal: Neck supple.     Comments: c collar in place via EMS Cardiovascular:     Rate and Rhythm: Normal rate and regular rhythm.     Heart sounds: No murmur.  Pulmonary:     Effort: Pulmonary effort is normal. No respiratory distress.     Breath sounds: Normal breath sounds. No wheezing.  Abdominal:     General: Abdomen is flat.     Palpations: Abdomen is soft.     Tenderness: There is no abdominal tenderness.  Genitourinary:    Comments: Slight edema of foreskin present.  Otherwise C/D/I with foley catheter in place Musculoskeletal: Normal range of motion.        General: No deformity or signs of injury.     Right lower leg: No edema.     Left lower leg: No edema.  Lymphadenopathy:     Cervical: No cervical adenopathy.  Skin:    General: Skin is warm and dry.  Neurological:     General: No focal deficit present.     Mental Status: He is alert. He is disoriented.     Cranial Nerves: No cranial nerve deficit.     Sensory: No sensory deficit.     Motor: No weakness.     Deep Tendon Reflexes: Reflexes normal.     Comments: Moving all 4 extremities with 5/5 strength.  CN II-XII intact bilaterally.  AAO to self  only.  GCS 15.  Intermittent full body tremors.  Asterixis bilaterally.  Tongue tremor present      ED Treatments / Results  Labs (all labs ordered are listed, but only abnormal results are displayed) Labs Reviewed - No data to display  EKG None  Radiology No results found.  Procedures Procedures (including critical care time)  Medications Ordered in ED Medications - No data to display   Initial  Impression / Assessment and Plan / ED Course  I have reviewed the triage vital signs and the nursing notes.  Pertinent labs & imaging results that were available during my care of the patient were reviewed by me and considered in my medical decision making (see chart for details).        Patient found down by family, HDS in the hospital.  CT head and neck without signs of acute trauma.  Unsure if this was a trip and fall or a primary syncopal episode.  Slight leucocytosis without lactic acidosis so does not appear to be septic.  BUN and Cr improving from previous values this week.  Patient with known alcoholism and last drink this AM.  Tongue fasciculations with hand asterixis and occasional hallucinations in the ED give concern for possible withdrawal.  UTI could also have this affect, so patient admitted for further workup.  Troponin negative. Alcohol negative. UDS negative. HDS in the ED. Continues to only be AAOx1 and intermittently x2.  Because of this he was admitted for further workup.  Awaiting ammonia level. CT head without acute signs of pathology.  Final Clinical Impressions(s) / ED Diagnoses   Final diagnoses:  Altered mental status, unspecified altered mental status type    ED Discharge Orders    None       Andee Poles, MD 05/03/18 2322    Gareth Morgan, MD 05/08/18 2019

## 2018-05-04 ENCOUNTER — Observation Stay (HOSPITAL_COMMUNITY): Payer: Medicare Other

## 2018-05-04 DIAGNOSIS — D631 Anemia in chronic kidney disease: Secondary | ICD-10-CM | POA: Diagnosis not present

## 2018-05-04 DIAGNOSIS — R6889 Other general symptoms and signs: Secondary | ICD-10-CM | POA: Diagnosis not present

## 2018-05-04 DIAGNOSIS — N1330 Unspecified hydronephrosis: Secondary | ICD-10-CM | POA: Diagnosis not present

## 2018-05-04 DIAGNOSIS — F1010 Alcohol abuse, uncomplicated: Secondary | ICD-10-CM | POA: Diagnosis present

## 2018-05-04 DIAGNOSIS — B954 Other streptococcus as the cause of diseases classified elsewhere: Secondary | ICD-10-CM | POA: Diagnosis present

## 2018-05-04 DIAGNOSIS — N319 Neuromuscular dysfunction of bladder, unspecified: Secondary | ICD-10-CM | POA: Diagnosis present

## 2018-05-04 DIAGNOSIS — R278 Other lack of coordination: Secondary | ICD-10-CM | POA: Diagnosis present

## 2018-05-04 DIAGNOSIS — R6510 Systemic inflammatory response syndrome (SIRS) of non-infectious origin without acute organ dysfunction: Secondary | ICD-10-CM | POA: Diagnosis present

## 2018-05-04 DIAGNOSIS — G9341 Metabolic encephalopathy: Secondary | ICD-10-CM | POA: Diagnosis not present

## 2018-05-04 DIAGNOSIS — I371 Nonrheumatic pulmonary valve insufficiency: Secondary | ICD-10-CM | POA: Diagnosis not present

## 2018-05-04 DIAGNOSIS — Z20822 Contact with and (suspected) exposure to covid-19: Secondary | ICD-10-CM

## 2018-05-04 DIAGNOSIS — E1122 Type 2 diabetes mellitus with diabetic chronic kidney disease: Secondary | ICD-10-CM | POA: Diagnosis present

## 2018-05-04 DIAGNOSIS — I120 Hypertensive chronic kidney disease with stage 5 chronic kidney disease or end stage renal disease: Secondary | ICD-10-CM | POA: Diagnosis present

## 2018-05-04 DIAGNOSIS — B951 Streptococcus, group B, as the cause of diseases classified elsewhere: Secondary | ICD-10-CM | POA: Diagnosis present

## 2018-05-04 DIAGNOSIS — Z87891 Personal history of nicotine dependence: Secondary | ICD-10-CM | POA: Diagnosis not present

## 2018-05-04 DIAGNOSIS — R4182 Altered mental status, unspecified: Secondary | ICD-10-CM

## 2018-05-04 DIAGNOSIS — N179 Acute kidney failure, unspecified: Secondary | ICD-10-CM | POA: Diagnosis not present

## 2018-05-04 DIAGNOSIS — Z8249 Family history of ischemic heart disease and other diseases of the circulatory system: Secondary | ICD-10-CM | POA: Diagnosis not present

## 2018-05-04 DIAGNOSIS — D649 Anemia, unspecified: Secondary | ICD-10-CM

## 2018-05-04 DIAGNOSIS — Z20828 Contact with and (suspected) exposure to other viral communicable diseases: Secondary | ICD-10-CM | POA: Diagnosis present

## 2018-05-04 DIAGNOSIS — E872 Acidosis: Secondary | ICD-10-CM

## 2018-05-04 DIAGNOSIS — R55 Syncope and collapse: Secondary | ICD-10-CM | POA: Diagnosis not present

## 2018-05-04 DIAGNOSIS — N184 Chronic kidney disease, stage 4 (severe): Secondary | ICD-10-CM | POA: Diagnosis not present

## 2018-05-04 DIAGNOSIS — W19XXXA Unspecified fall, initial encounter: Secondary | ICD-10-CM | POA: Diagnosis not present

## 2018-05-04 DIAGNOSIS — N185 Chronic kidney disease, stage 5: Secondary | ICD-10-CM | POA: Diagnosis present

## 2018-05-04 DIAGNOSIS — I088 Other rheumatic multiple valve diseases: Secondary | ICD-10-CM | POA: Diagnosis present

## 2018-05-04 DIAGNOSIS — Y92009 Unspecified place in unspecified non-institutional (private) residence as the place of occurrence of the external cause: Secondary | ICD-10-CM | POA: Diagnosis not present

## 2018-05-04 DIAGNOSIS — Z87440 Personal history of urinary (tract) infections: Secondary | ICD-10-CM | POA: Diagnosis not present

## 2018-05-04 DIAGNOSIS — N136 Pyonephrosis: Secondary | ICD-10-CM | POA: Diagnosis present

## 2018-05-04 DIAGNOSIS — Z79899 Other long term (current) drug therapy: Secondary | ICD-10-CM | POA: Diagnosis not present

## 2018-05-04 DIAGNOSIS — E871 Hypo-osmolality and hyponatremia: Secondary | ICD-10-CM | POA: Diagnosis present

## 2018-05-04 DIAGNOSIS — I361 Nonrheumatic tricuspid (valve) insufficiency: Secondary | ICD-10-CM | POA: Diagnosis not present

## 2018-05-04 LAB — CBC WITH DIFFERENTIAL/PLATELET
Abs Immature Granulocytes: 0.05 10*3/uL (ref 0.00–0.07)
Basophils Absolute: 0 10*3/uL (ref 0.0–0.1)
Basophils Relative: 1 %
Eosinophils Absolute: 0.2 10*3/uL (ref 0.0–0.5)
Eosinophils Relative: 3 %
HCT: 26.1 % — ABNORMAL LOW (ref 39.0–52.0)
Hemoglobin: 8.2 g/dL — ABNORMAL LOW (ref 13.0–17.0)
Immature Granulocytes: 1 %
Lymphocytes Relative: 26 %
Lymphs Abs: 2.1 10*3/uL (ref 0.7–4.0)
MCH: 27.9 pg (ref 26.0–34.0)
MCHC: 31.4 g/dL (ref 30.0–36.0)
MCV: 88.8 fL (ref 80.0–100.0)
Monocytes Absolute: 0.9 10*3/uL (ref 0.1–1.0)
Monocytes Relative: 10 %
Neutro Abs: 5 10*3/uL (ref 1.7–7.7)
Neutrophils Relative %: 59 %
Platelets: 274 10*3/uL (ref 150–400)
RBC: 2.94 MIL/uL — ABNORMAL LOW (ref 4.22–5.81)
RDW: 15.7 % — ABNORMAL HIGH (ref 11.5–15.5)
WBC: 8.3 10*3/uL (ref 4.0–10.5)
nRBC: 0 % (ref 0.0–0.2)

## 2018-05-04 LAB — BASIC METABOLIC PANEL
Anion gap: 10 (ref 5–15)
BUN: 78 mg/dL — ABNORMAL HIGH (ref 8–23)
CO2: 23 mmol/L (ref 22–32)
Calcium: 7.1 mg/dL — ABNORMAL LOW (ref 8.9–10.3)
Chloride: 104 mmol/L (ref 98–111)
Creatinine, Ser: 6.89 mg/dL — ABNORMAL HIGH (ref 0.61–1.24)
GFR calc Af Amer: 9 mL/min — ABNORMAL LOW (ref >60)
GFR calc non Af Amer: 8 mL/min — ABNORMAL LOW (ref >60)
Glucose, Bld: 83 mg/dL (ref 70–99)
Potassium: 4.2 mmol/L (ref 3.5–5.1)
Sodium: 137 mmol/L (ref 135–145)

## 2018-05-04 LAB — GLUCOSE, CAPILLARY
Glucose-Capillary: 62 mg/dL — ABNORMAL LOW (ref 70–99)
Glucose-Capillary: 141 mg/dL — ABNORMAL HIGH (ref 70–99)
Glucose-Capillary: 116 mg/dL — ABNORMAL HIGH (ref 70–99)
Glucose-Capillary: 118 mg/dL — ABNORMAL HIGH (ref 70–99)
Glucose-Capillary: 138 mg/dL — ABNORMAL HIGH (ref 70–99)

## 2018-05-04 LAB — VITAMIN B12: Vitamin B-12: 505 pg/mL (ref 180–914)

## 2018-05-04 LAB — URINE CULTURE: Culture: NO GROWTH

## 2018-05-04 LAB — HEMOGLOBIN A1C
Hgb A1c MFr Bld: 6 % — ABNORMAL HIGH (ref 4.8–5.6)
Mean Plasma Glucose: 125.5 mg/dL

## 2018-05-04 LAB — SEDIMENTATION RATE: Sed Rate: 125 mm/hr — ABNORMAL HIGH (ref 0–16)

## 2018-05-04 LAB — TSH: TSH: 2.44 u[IU]/mL (ref 0.350–4.500)

## 2018-05-04 MED ORDER — INSULIN ASPART 100 UNIT/ML ~~LOC~~ SOLN: 0.0000 [IU] | Freq: Three times a day (TID) | SUBCUTANEOUS | Status: DC

## 2018-05-04 NOTE — Progress Notes (Addendum)
[facility] PROGRESS NOTE  Matthew [name] GDJ:[id number] DOB: 01/17/1951 DOA: 05/03/2018 PCP: Wendie Agreste, MD  Assessment/Plan: Unwitnessed fall, suspect syncope due to unclear etiology Agree this could possibly be orthostatic given complaints of dizziness/lightheadedness while ambulating before blacking out.  Did not have any chest pain prior and encouraged by negative troponin, and nonischemic EKG.  CT head/C-spine also negative for acute abnormalities.  Orthostatics checked after fluid resuscitation are within normal limits.  We will follow-up TTE to ensure no changes in EF/wall motion abnormalities and continue to monitor telemetry for arrhythmias.  Has known CKD but no acute electrolyte abnormalities to explain symptoms additionally feel low likelihood for potential infection he had a low-grade temp of 100.2 per EMS but remained afebrile here, though he was being treated for UTI and continues to have pyuria here we will evaluate.  Intermittent Confusion, improving In ED unsure of year with reports of hallucinations as well as tongue fasciculations and hand asterixis.  He continues to have some asterixis on exam for me, alert to self, place but not to time (believes it is 2001)) CT head shows no acute findings but is consistent with mild atrophy.  Will add B12, TSH. Ammonia wnl. I suspect some of this maybe chronic given atrophy on CT head. Monitor CIWA, folic, thiamine given alcohol abuse, not likely uremia given improvement and stability in BUN/Cr, nephrology will continue to monitor.   Suspected COVID infection, low risk I think this is less likely given no respiratory symptoms, no current oxygen requirement.  He potentially has some exposures given he was recently discharged from the hospital on 4/6.  COVID PCR pending, continue contact/droplet precautions.  Low-grade fever and leukocytosis, improved Sirs criteria met on admission given temp of 100.2 leukocytosis of 12 which has  since resolved.  No clear etiology for infection given negative chest x-ray, no localizing symptoms.  Patient was recently treated for UTI so UA does show some pyuria but rare bacteria, additionally he has a chronic foley so at risk for colonization with urine culture.  Chronic weakness/dizziness Probably combination of deconditioning due to recent admission and progression of CKD.  BUN is improved from prior admission but still some potential for uremia, ammonia, UDS, ethanol level, orthostatic vitals are unremarkable. No focal deficits on exam and CT head imaging with nothing acute  Pyuria, recently treated for acute urinary tract infection (discharged on Augmentin) UA does show some pyuria which is not totally unsurprising given recent treatment for UTI with rare bacteria, patient has chronic Foley risk for colonization,, will follow urine cultures and continue ceftriaxone for now but doubt new infection, hopefully can resume oral augmentin  CKD, stage V, presumed secondary to urinary obstruction/bilateral hydronephrosis with acute worsening from recent UTI on last admission, stable GFR less than 15, that and creatinine stable from most recent admission.  Reports no change in urination has maintained chronic Foley but only minimal output on my exam.  I have consulted nephrology given its possible some of his dizziness/weakness/confusion could potentially be related to uremia though his BUN is much less than his most recent admission, they are following and do not think he will need HD presently, will continue to monitor Cr/BUN, mentation status. Continue to monitor output, repeat ultrasound shows improvement in bilateral hydronephrosis.  Chronic non anion gap metabolic acidosis, secondary to CKD Continue Sodium bicarb, monitor BMP  Ethanol abuse Daughter reports he was drinking 1 beer prior to the fall as far as they know. Before he was drinking "quite  a bit of beer" not in one day. Hemodynamically  stable with no active withdrawal signs for me, CIWA monitoring  Normocytic Anemia, chronic, stable Hgb stable at 8-8.8, no signs or symptoms of gross bleeding, trend cbc  Code Status: FULL Code Family Communication: Spoke with daughter [name] at [phone]   Disposition Plan: Follow-up TTE, nephrology consulted, renal ultrasound to assess for hydronephrosis, concern confusion/weakness may be related to progression of CKD but unclear goal see what nephrology thinks on assessment.   Consultants:  Nephrology  Procedures:  None  Antibiotics:  IV ceftriaxone, 4/6 (indicate start date, and stop date if known)  HPI/Subjective:  Matthew [name] is a 68 y.o. year old male with medical history significant for CKD stage V, chronic Foley, type 2 diabetes, bilateral hydronephrosis, HTN, recent admission From 3/30-4/5 for AKI on CKD in setting of acute UTI due to recurrence of catheterization and chronic urinary obstruction with bilateral hydronephrosiswho presented on 05/03/2018 with worsening dizziness/weakness while ambulating resulting in unwitnessed fall and was found to have low-grade temp, leukocytosis, confusion, pyuria.  Was admitted with working diagnosis of unwitnessed fall secondary to syncope and some concern for potential UTI..   States he feels much better this morning Denies any cough, fevers or chills Denies any abdominal pain, nausea, vomiting   Objective: Vitals:   05/04/18 0908 05/04/18 0911  BP: 124/79 123/73  Pulse: 98 (!) 103  Resp:    Temp:    SpO2:      Intake/Output Summary (Last 24 hours) at 05/04/2018 1044 Last data filed at 05/04/2018 0515 Gross per 24 hour  Intake 1007.06 ml  Output 1275 ml  Net -267.94 ml   Filed Weights   05/03/18 1610 05/03/18 2140  Weight: 69.7 kg 72.4 kg    Exam:   General: Lying in bed, no distress, resting comfortably  Cardiovascular: Regular rate and rhythm, no peripheral edema  Respiratory: Normal respiratory effort on  room air, clear breath sounds throughout  Abdomen: Soft, nondistended, nontender, normal bowel sounds  GU: Foley catheter in place draining 200 cc of clear yellow urine  Musculoskeletal: Normal range of motion  Skin abrasion on left side of face closest I  Neurologic alert, oriented to person, place, context, not to year (believes it is 2001, but knows the month is April)  Data Reviewed: Basic Metabolic Panel: Recent Labs  Lab 04/29/18 0542 04/30/18 0619 05/01/18 0443 05/02/18 0630 05/03/18 1623 05/04/18 0309  NA 136 136 137 137 136 137  K 3.1* 3.3* 3.6 4.1 3.8 4.2  CL 100 100 102 103 102 104  CO2 24 21* 22 25 21* 23  GLUCOSE 97 95 107* 94 106* 83  BUN 111* 97* 91* 84* 79* 78*  CREATININE 10.65* 9.27* 8.34* 7.39* 7.31* 6.89*  CALCIUM 6.2* 6.1* 6.1* 6.7* 7.0* 7.1*  PHOS 5.2* 4.4 3.6 3.9  --   --    Liver Function Tests: Recent Labs  Lab 04/29/18 0542 04/30/18 0619 05/01/18 0443 05/02/18 0630 05/03/18 1623  AST  --   --   --   --  20  ALT  --   --   --   --  16  ALKPHOS  --   --   --   --  39  BILITOT  --   --   --   --  0.2*  PROT  --   --   --   --  8.6*  ALBUMIN 2.0* 2.0* 2.0* 2.0* 2.4*   No results for input(s): LIPASE,  AMYLASE in the last 168 hours. Recent Labs  Lab 05/03/18 1926  AMMONIA 23   CBC: Recent Labs  Lab 04/29/18 0541 04/30/18 0619 04/30/18 1603 05/01/18 0443 05/03/18 1623 05/04/18 0309  WBC 6.5 5.5  --  7.5 12.5* 8.3  NEUTROABS  --   --   --   --  9.3* 5.0  HGB 7.1* 6.7* 7.9* 8.0* 8.8* 8.2*  HCT 21.4* 21.0* 24.3* 25.0* 29.1* 26.1*  MCV 82.6 86.1  --  85.3 89.3 88.8  PLT 328 300  --  296 298 274   Cardiac Enzymes: Recent Labs  Lab 05/03/18 1623 05/03/18 2149  CKTOTAL 112  --   CKMB 1.6  --   TROPONINI  --  <0.03   BNP (last 3 results) No results for input(s): BNP in the last 8760 hours.  ProBNP (last 3 results) Recent Labs    07/31/17 1540  PROBNP 93.0    CBG: Recent Labs  Lab 05/04/18 0740 05/04/18 0855   GLUCAP 62* 141*    Recent Results (from the past 240 hour(s))  Urine Culture     Status: Abnormal   Collection Time: 04/26/18 11:34 AM  Result Value Ref Range Status   Urine Culture, Routine Final report (A)  Final   Organism ID, Bacteria Comment (A)  Final    Comment: Beta hemolytic Streptococcus, group B Greater than 100,000 colony forming units per mL Penicillin and ampicillin are drugs of choice for treatment of beta-hemolytic streptococcal infections. Susceptibility testing of penicillins and other beta-lactam agents approved by the FDA for treatment of beta-hemolytic streptococcal infections need not be performed routinely because nonsusceptible isolates are extremely rare in any beta-hemolytic streptococcus and have not been reported for Streptococcus pyogenes (group A). (CLSI)   Urine culture     Status: Abnormal   Collection Time: 04/26/18  2:30 PM  Result Value Ref Range Status   Specimen Description   Final    URINE, CLEAN CATCH Performed at [facility], [place] [address], [place], [place] [address]    Special Requests   Final    NONE Performed at [facility], [place] [address], [place], [place] [address]    Culture (A)  Final    >=100,000 COLONIES/mL GROUP B STREP(S.AGALACTIAE)ISOLATED TESTING AGAINST S. AGALACTIAE NOT ROUTINELY PERFORMED DUE TO PREDICTABILITY OF AMP/PEN/VAN SUSCEPTIBILITY. Performed at [facility], [place] [address], [place],  [address]    Report Status 04/28/2018 FINAL  Final     Studies: Ct Head Wo Contrast  Result Date: 05/03/2018 CLINICAL DATA:  Laceration to the left side of the face. Confusion. Found on the ground. EXAM: CT HEAD WITHOUT CONTRAST CT CERVICAL SPINE WITHOUT CONTRAST TECHNIQUE: Multidetector CT imaging of the head and cervical spine was performed following the standard protocol without intravenous contrast. Multiplanar CT image reconstructions of the cervical spine were also  generated. COMPARISON:  04/26/2018 FINDINGS: CT HEAD FINDINGS Brain: Generalized atrophy. Chronic small-vessel ischemic changes of the white matter. No sign of acute infarction, mass lesion, hemorrhage, hydrocephalus or extra-axial collection. Physiologic calcification of the basal ganglia. Vascular: No abnormal vascular finding. Skull: No skull fracture. Sinuses/Orbits: Clear/normal Other: Left cheek soft tissue swelling. CT CERVICAL SPINE FINDINGS Alignment: No traumatic malalignment. Skull base and vertebrae: No fracture or primary lesion. Chronic fusion at C5-6. Soft tissues and spinal canal: Negative Disc levels: Facet osteoarthritis on the right at C3-4 and C6-7 and on the left at C2-3, and C4-5. No apparent compressive canal or foraminal stenosis.  Upper chest: Bullous emphysema at the left apex without visible lung markings. No change from previous. Other: None IMPRESSION: Head CT: No acute or traumatic finding. Mild atrophy and chronic small-vessel change of the white matter. Cervical spine CT: No acute or traumatic finding. Chronic fusion C5-6. Chronic degenerative spondylosis and facet arthropathy. Large bulla at the left lung apex as seen previously. Electronically Signed   By: Nelson Chimes M.D.   On: 05/03/2018 17:28   Ct Cervical Spine Wo Contrast  Result Date: 05/03/2018 CLINICAL DATA:  Laceration to the left side of the face. Confusion. Found on the ground. EXAM: CT HEAD WITHOUT CONTRAST CT CERVICAL SPINE WITHOUT CONTRAST TECHNIQUE: Multidetector CT imaging of the head and cervical spine was performed following the standard protocol without intravenous contrast. Multiplanar CT image reconstructions of the cervical spine were also generated. COMPARISON:  04/26/2018 FINDINGS: CT HEAD FINDINGS Brain: Generalized atrophy. Chronic small-vessel ischemic changes of the white matter. No sign of acute infarction, mass lesion, hemorrhage, hydrocephalus or extra-axial collection. Physiologic calcification of  the basal ganglia. Vascular: No abnormal vascular finding. Skull: No skull fracture. Sinuses/Orbits: Clear/normal Other: Left cheek soft tissue swelling. CT CERVICAL SPINE FINDINGS Alignment: No traumatic malalignment. Skull base and vertebrae: No fracture or primary lesion. Chronic fusion at C5-6. Soft tissues and spinal canal: Negative Disc levels: Facet osteoarthritis on the right at C3-4 and C6-7 and on the left at C2-3, and C4-5. No apparent compressive canal or foraminal stenosis. Upper chest: Bullous emphysema at the left apex without visible lung markings. No change from previous. Other: None IMPRESSION: Head CT: No acute or traumatic finding. Mild atrophy and chronic small-vessel change of the white matter. Cervical spine CT: No acute or traumatic finding. Chronic fusion C5-6. Chronic degenerative spondylosis and facet arthropathy. Large bulla at the left lung apex as seen previously. Electronically Signed   By: Nelson Chimes M.D.   On: 05/03/2018 17:28   Dg Chest Port 1 View  Result Date: 05/03/2018 CLINICAL DATA:  Fall.  Fever. EXAM: PORTABLE CHEST 1 VIEW COMPARISON:  Chest radiograph and chest CT April 26, 2018 FINDINGS: There is a stable large bulla in the left upper lobe. There is no appreciable edema or consolidation. Heart is upper normal in size with pulmonary vascularity normal. No adenopathy. No bone lesions. IMPRESSION: Large left upper lobe bulla. No edema or consolidation. Stable cardiac silhouette. Electronically Signed   By: Lowella Grip III M.D.   On: 05/03/2018 17:51    Scheduled Meds: . calcitRIOL  0.5 mcg Oral Daily  . folic acid  1 mg Oral Daily  . heparin  5,000 Units Subcutaneous Q8H  . insulin aspart  0-9 Units Subcutaneous TID WC  . multivitamin with minerals  1 tablet Oral Daily  . sodium bicarbonate  650 mg Oral BID  . thiamine  100 mg Oral Daily   Or  . thiamine  100 mg Intravenous Daily   Continuous Infusions: . cefTRIAXone (ROCEPHIN)  IV Stopped (05/04/18  0134)    Principal Problem:   Syncope Active Problems:   Anemia, chronic renal failure, stage 4 (severe) ([place])   Fall   Acute metabolic encephalopathy   Suspected Covid-19 Virus Infection      Desiree Hane  [facility]

## 2018-05-04 NOTE — Consult Note (Addendum)
[name]  HISTORY AND PHYSICAL  Matthew [name] is an 68 y.o. male.    Chief Complaint: fall and AMS  HPI: Pt is a 52 M with a PMH significant for CKD V, neurogenic bladder needing self-catheterization, and symptomatic anemia who is now seen in consultation at the request of Dr. [name] for evaluation and recommendations for CKD.    Pt with recent admission to [facility] 3/30-4/5 for AKI on CKD due to difficulty self-cathing, urinary retention, and UTI.  He was found to have Cr on admission of 14 and bilateral hydronephrosis.  Foley was placed and he was started on antibiotics for UTI.  Was found to be growing S agalactiae and was placed on Augmentin.  Discharge Cr was 7.39 on 4/5.  He returned to the ED yesterday after having fallen at home.  He was found down beside a car.  He had apparently drunk 2 beers that morning and was feeling lightheaded and dizzy.  Low-grade fever to 100.2 documented by EMS.  Cr was 7.3 and was 6.89 this AM.  Placed on IVFs, started on ceftriaxone and urine recultured.  CT head and c-spine negative, CXR negative,Renal ultrasound with improving hydronephrosis.   COVID-19 is being ruled out.  PMH: Past Medical History:  Diagnosis Date  . Diabetes ([place])   . ESRD (end stage renal disease) ([name]) 04/26/2018  . Essential hypertension    PSH: Past Surgical History:  Procedure Laterality Date  . finger lanced       Past Medical History:  Diagnosis Date  . Diabetes ([name])   . ESRD (end stage renal disease) ([place]) 04/26/2018  . Essential hypertension     Medications:   Scheduled: . calcitRIOL  0.5 mcg Oral Daily  . folic acid  1 mg Oral Daily  . heparin  5,000 Units Subcutaneous Q8H  . insulin aspart  0-9 Units Subcutaneous TID WC  . multivitamin with minerals  1 tablet Oral Daily  . sodium bicarbonate  650 mg Oral BID  . thiamine  100 mg Oral Daily   Or  . thiamine  100 mg Intravenous Daily    Medications Prior to Admission  Medication  Sig Dispense Refill  . amoxicillin-clavulanate (AUGMENTIN) 500-125 MG tablet Take 1 tablet (500 mg total) by mouth daily. (Patient taking differently: Take 1 tablet by mouth daily. FOR 7 DAYS) 7 tablet 0  . calcitRIOL (ROCALTROL) 0.5 MCG capsule Take 1 capsule (0.5 mcg total) by mouth daily. 30 capsule 1  . ibuprofen (ADVIL,MOTRIN) 200 MG tablet Take 200-400 mg by mouth every 8 (eight) hours as needed (for pain).     . sodium bicarbonate 650 MG tablet Take 1 tablet (650 mg total) by mouth 2 (two) times daily. 30 tablet 1    ALLERGIES:  No Known Allergies  FAM HX: Family History  Problem Relation Age of Onset  . Hypertension Mother   . Migraines Mother   . Heart disease Mother     Social History:   reports that he quit smoking about 21 years ago. His smoking use included cigarettes. He has a 0.25 pack-year smoking history. He has never used smokeless tobacco. He reports current alcohol use of about 10.0 - 12.0 standard drinks of alcohol per week. He reports that he does not use drugs.  ROS: ROS: per HPI  Blood pressure 123/73, pulse (!) 103, temperature 97.8 F (36.6 C), temperature source Oral, resp. rate 18, height 5\' 10"  (1.778 m), weight 72.4 kg, SpO2 100 %. PHYSICAL EXAM:  Physical Exam  Relying on physical exam performed by hospitalist d/t COVID-19 ruleout   Results for orders placed or performed during the hospital encounter of 05/03/18 (from the past 48 hour(s))  Comprehensive metabolic panel     Status: Abnormal   Collection Time: 05/03/18  4:23 PM  Result Value Ref Range   Sodium 136 135 - 145 mmol/L   Potassium 3.8 3.5 - 5.1 mmol/L   Chloride 102 98 - 111 mmol/L   CO2 21 (L) 22 - 32 mmol/L   Glucose, Bld 106 (H) 70 - 99 mg/dL   BUN 79 (H) 8 - 23 mg/dL   Creatinine, Ser 7.31 (H) 0.61 - 1.24 mg/dL   Calcium 7.0 (L) 8.9 - 10.3 mg/dL   Total Protein 8.6 (H) 6.5 - 8.1 g/dL   Albumin 2.4 (L) 3.5 - 5.0 g/dL   AST 20 15 - 41 U/L   ALT 16 0 - 44 U/L   Alkaline  Phosphatase 39 38 - 126 U/L   Total Bilirubin 0.2 (L) 0.3 - 1.2 mg/dL   GFR calc non Af Amer 7 (L) >60 mL/min   GFR calc Af Amer 8 (L) >60 mL/min   Anion gap 13 5 - 15    Comment: Performed at [facility], [address], [place], [place] [address]  Ethanol     Status: None   Collection Time: 05/03/18  4:23 PM  Result Value Ref Range   Alcohol, Ethyl (B) <10 <10 mg/dL    Comment: (NOTE) Lowest detectable limit for serum alcohol is 10 mg/dL. For medical purposes only. Performed at [facility], [place] [address], [place], [place] [address]   Lactic acid, plasma     Status: None   Collection Time: 05/03/18  4:23 PM  Result Value Ref Range   Lactic Acid, Venous 1.1 0.5 - 1.9 mmol/L    Comment: Performed at [facility] [address] [place], [place] [address]  Protime-INR     Status: None   Collection Time: 05/03/18  4:23 PM  Result Value Ref Range   Prothrombin Time 14.7 11.4 - 15.2 seconds   INR 1.2 0.8 - 1.2    Comment: (NOTE) INR goal varies based on device and disease states. Performed at [facility], [place] [address] [place], [place] [address]   CK total and CKMB     Status: None   Collection Time: 05/03/18  4:23 PM  Result Value Ref Range   Total CK 112 49 - 397 U/L   CK, MB 1.6 0.5 - 5.0 ng/mL   Relative Index 1.4 0.0 - 2.5    Comment: Performed at [facility] [address], [place], [place] [address]  CBC WITH DIFFERENTIAL     Status: Abnormal   Collection Time: 05/03/18  4:23 PM  Result Value Ref Range   WBC 12.5 (H) 4.0 - 10.5 K/uL   RBC 3.26 (L) 4.22 - 5.81 MIL/uL   Hemoglobin 8.8 (L) 13.0 - 17.0 g/dL   HCT 29.1 (L) 39.0 - 52.0 %   MCV 89.3 80.0 - 100.0 fL   MCH 27.0 26.0 - 34.0 pg   MCHC 30.2 30.0 - 36.0 g/dL   RDW 15.8 (H) 11.5 - 15.5 %   Platelets 298 150 - 400 K/uL   nRBC 0.0 0.0 - 0.2 %   Neutrophils Relative % 73 %   Neutro Abs 9.3 (H) 1.7 - 7.7 K/uL   Lymphocytes Relative 16 %  Lymphs Abs 1.9 0.7 - 4.0 K/uL    Monocytes Relative 7 %   Monocytes Absolute 0.8 0.1 - 1.0 K/uL   Eosinophils Relative 2 %   Eosinophils Absolute 0.3 0.0 - 0.5 K/uL   Basophils Relative 1 %   Basophils Absolute 0.1 0.0 - 0.1 K/uL   Immature Granulocytes 1 %   Abs Immature Granulocytes 0.07 0.00 - 0.07 K/uL    Comment: Performed at [facility], [place] [address], [place], [place] [address]  CDS serology     Status: None   Collection Time: 05/03/18  5:00 PM  Result Value Ref Range   CDS serology specimen      SPECIMEN WILL BE HELD FOR 14 DAYS IF TESTING IS REQUIRED    Comment: SPECIMEN WILL BE HELD FOR 14 DAYS IF TESTING IS REQUIRED SPECIMEN WILL BE HELD FOR 14 DAYS IF TESTING IS REQUIRED Performed at [facility], [place] [address] [place], [place] [address]   Urinalysis, Routine w reflex microscopic     Status: Abnormal   Collection Time: 05/03/18  5:15 PM  Result Value Ref Range   Color, Urine YELLOW YELLOW   APPearance HAZY (A) CLEAR   Specific Gravity, Urine 1.011 1.005 - 1.030   pH 6.0 5.0 - 8.0   Glucose, UA NEGATIVE NEGATIVE mg/dL   Hgb urine dipstick LARGE (A) NEGATIVE   Bilirubin Urine NEGATIVE NEGATIVE   Ketones, ur NEGATIVE NEGATIVE mg/dL   Protein, ur 100 (A) NEGATIVE mg/dL   Nitrite NEGATIVE NEGATIVE   Leukocytes,Ua LARGE (A) NEGATIVE   RBC / HPF >50 (H) 0 - 5 RBC/hpf   WBC, UA >50 (H) 0 - 5 WBC/hpf   Bacteria, UA RARE (A) NONE SEEN    Comment: Performed at [facility], [place] [address], [place], [place] [address]  Urine rapid drug screen (hosp performed)     Status: None   Collection Time: 05/03/18  5:15 PM  Result Value Ref Range   Opiates NONE DETECTED NONE DETECTED   Cocaine NONE DETECTED NONE DETECTED   Benzodiazepines NONE DETECTED NONE DETECTED   Amphetamines NONE DETECTED NONE DETECTED   Tetrahydrocannabinol NONE DETECTED NONE DETECTED   Barbiturates NONE DETECTED NONE DETECTED    Comment: (NOTE) DRUG SCREEN FOR MEDICAL PURPOSES ONLY.  IF CONFIRMATION IS NEEDED FOR ANY  PURPOSE, NOTIFY LAB WITHIN 5 DAYS. LOWEST DETECTABLE LIMITS FOR URINE DRUG SCREEN Drug Class                     Cutoff (ng/mL) Amphetamine and metabolites    1000 Barbiturate and metabolites    200 Benzodiazepine                 025 Tricyclics and metabolites     300 Opiates and metabolites        300 Cocaine and metabolites        300 THC                            50 Performed at [facility], [place] [address] [place], [place] [address]   Urine culture     Status: None   Collection Time: 05/03/18  5:15 PM  Result Value Ref Range   Specimen Description URINE, CATHETERIZED    Special Requests NONE    Culture      NO GROWTH Performed at [facility], [place],  [place] [address]    Report Status 05/04/2018 FINAL   Ammonia     Status: None   Collection Time: 05/03/18  7:26 PM  Result Value Ref Range   Ammonia 23 9 - 35 umol/L    Comment: Performed at [facility], [place] [address] [place], [place] [address]  Sample to Blood Bank     Status: None   Collection Time: 05/03/18  9:49 PM  Result Value Ref Range   Blood Bank Specimen SAMPLE AVAILABLE FOR TESTING    Sample Expiration      05/04/2018 Performed at [facility], [place] [address] [place], [place] [address]   Procalcitonin     Status: None   Collection Time: 05/03/18  9:49 PM  Result Value Ref Range   Procalcitonin 0.60 ng/mL    Comment:        Interpretation: PCT > 0.5 ng/mL and <= 2 ng/mL: Systemic infection (sepsis) is possible, but other conditions are known to elevate PCT as well. (NOTE)       Sepsis PCT Algorithm           Lower Respiratory Tract                                      Infection PCT Algorithm    ----------------------------     ----------------------------         PCT < 0.25 ng/mL                PCT < 0.10 ng/mL         Strongly encourage             Strongly discourage   discontinuation of antibiotics    initiation of antibiotics     ----------------------------     -----------------------------       PCT 0.25 - 0.50 ng/mL            PCT 0.10 - 0.25 ng/mL               OR       >80% decrease in PCT            Discourage initiation of                                            antibiotics      Encourage discontinuation           of antibiotics    ----------------------------     -----------------------------         PCT >= 0.50 ng/mL              PCT 0.26 - 0.50 ng/mL                AND       <80% decrease in PCT             Encourage initiation of                                             antibiotics       Encourage continuation           of antibiotics    ----------------------------     -----------------------------  PCT >= 0.50 ng/mL                  PCT > 0.50 ng/mL               AND         increase in PCT                  Strongly encourage                                      initiation of antibiotics    Strongly encourage escalation           of antibiotics                                     -----------------------------                                           PCT <= 0.25 ng/mL                                                 OR                                        > 80% decrease in PCT                                     Discontinue / Do not initiate                                             antibiotics Performed at [facility], [address], [place], [place] [address]   Ferritin     Status: Abnormal   Collection Time: 05/03/18  9:49 PM  Result Value Ref Range   Ferritin 539 (H) 24 - 336 ng/mL    Comment: Performed at [name], [place] [address] [place], [place] [address]  Sedimentation rate     Status: Abnormal   Collection Time: 05/03/18  9:49 PM  Result Value Ref Range   Sed Rate 125 (H) 0 - 16 mm/hr    Comment: Performed at [facility] [address], [place], [place] [address]  C-reactive protein     Status: Abnormal   Collection Time: 05/03/18  9:49  PM  Result Value Ref Range   CRP 1.8 (H) <1.0 mg/dL    Comment: Performed at [facility] [address], [place], [place] [address]  Lactate dehydrogenase     Status: None   Collection Time: 05/03/18  9:49 PM  Result Value Ref Range   LDH 145 98 - 192 U/L    Comment: Performed at [facility], [place] [address] [place], [place] [address]  Troponin I - Once  Status: None   Collection Time: 05/03/18  9:49 PM  Result Value Ref Range   Troponin I <0.03 <0.03 ng/mL    Comment: Performed at [facility], [place] [address] [place], [place] [address]  Basic metabolic panel     Status: Abnormal   Collection Time: 05/04/18  3:09 AM  Result Value Ref Range   Sodium 137 135 - 145 mmol/L   Potassium 4.2 3.5 - 5.1 mmol/L   Chloride 104 98 - 111 mmol/L   CO2 23 22 - 32 mmol/L   Glucose, Bld 83 70 - 99 mg/dL   BUN 78 (H) 8 - 23 mg/dL   Creatinine, Ser 6.89 (H) 0.61 - 1.24 mg/dL   Calcium 7.1 (L) 8.9 - 10.3 mg/dL   GFR calc non Af Amer 8 (L) >60 mL/min   GFR calc Af Amer 9 (L) >60 mL/min   Anion gap 10 5 - 15    Comment: Performed at [facility] [address], [place], [place] [address]  CBC with Differential/Platelet     Status: Abnormal   Collection Time: 05/04/18  3:09 AM  Result Value Ref Range   WBC 8.3 4.0 - 10.5 K/uL   RBC 2.94 (L) 4.22 - 5.81 MIL/uL   Hemoglobin 8.2 (L) 13.0 - 17.0 g/dL   HCT 26.1 (L) 39.0 - 52.0 %   MCV 88.8 80.0 - 100.0 fL   MCH 27.9 26.0 - 34.0 pg   MCHC 31.4 30.0 - 36.0 g/dL   RDW 15.7 (H) 11.5 - 15.5 %   Platelets 274 150 - 400 K/uL   nRBC 0.0 0.0 - 0.2 %   Neutrophils Relative % 59 %   Neutro Abs 5.0 1.7 - 7.7 K/uL   Lymphocytes Relative 26 %   Lymphs Abs 2.1 0.7 - 4.0 K/uL   Monocytes Relative 10 %   Monocytes Absolute 0.9 0.1 - 1.0 K/uL   Eosinophils Relative 3 %   Eosinophils Absolute 0.2 0.0 - 0.5 K/uL   Basophils Relative 1 %   Basophils Absolute 0.0 0.0 - 0.1 K/uL   Immature Granulocytes 1 %   Abs Immature  Granulocytes 0.05 0.00 - 0.07 K/uL    Comment: Performed at [facility] [address] [place], [place] [address]  Hemoglobin A1c     Status: Abnormal   Collection Time: 05/04/18  3:09 AM  Result Value Ref Range   Hgb A1c MFr Bld 6.0 (H) 4.8 - 5.6 %    Comment: (NOTE) Pre diabetes:          5.7%-6.4% Diabetes:              >6.4% Glycemic control for   <7.0% adults with diabetes    Mean Plasma Glucose 125.5 mg/dL    Comment: Performed at [facility] [address], [place], [place] [address]  Glucose, capillary     Status: Abnormal   Collection Time: 05/04/18  7:40 AM  Result Value Ref Range   Glucose-Capillary 62 (L) 70 - 99 mg/dL  Glucose, capillary     Status: Abnormal   Collection Time: 05/04/18  8:55 AM  Result Value Ref Range   Glucose-Capillary 141 (H) 70 - 99 mg/dL  TSH     Status: None   Collection Time: 05/04/18 11:16 AM  Result Value Ref Range   TSH 2.440 0.350 - 4.500 uIU/mL    Comment: Performed by a 3rd Generation assay with a functional sensitivity of <=0.01  uIU/mL. Performed at [facility], [place] [address] [place], [place] [address]   Vitamin B12     Status: None   Collection Time: 05/04/18 11:16 AM  Result Value Ref Range   Vitamin B-12 505 180 - 914 pg/mL    Comment: (NOTE) This assay is not validated for testing neonatal or myeloproliferative syndrome specimens for Vitamin B12 levels. Performed at [facility], [place] [address] [place], [place] [address]   Glucose, capillary     Status: Abnormal   Collection Time: 05/04/18 12:18 PM  Result Value Ref Range   Glucose-Capillary 116 (H) 70 - 99 mg/dL    Ct Head Wo Contrast  Result Date: 05/03/2018 CLINICAL DATA:  Laceration to the left side of the face. Confusion. Found on the ground. EXAM: CT HEAD WITHOUT CONTRAST CT CERVICAL SPINE WITHOUT CONTRAST TECHNIQUE: Multidetector CT imaging of the head and cervical spine was performed following the standard protocol without  intravenous contrast. Multiplanar CT image reconstructions of the cervical spine were also generated. COMPARISON:  04/26/2018 FINDINGS: CT HEAD FINDINGS Brain: Generalized atrophy. Chronic small-vessel ischemic changes of the white matter. No sign of acute infarction, mass lesion, hemorrhage, hydrocephalus or extra-axial collection. Physiologic calcification of the basal ganglia. Vascular: No abnormal vascular finding. Skull: No skull fracture. Sinuses/Orbits: Clear/normal Other: Left cheek soft tissue swelling. CT CERVICAL SPINE FINDINGS Alignment: No traumatic malalignment. Skull base and vertebrae: No fracture or primary lesion. Chronic fusion at C5-6. Soft tissues and spinal canal: Negative Disc levels: Facet osteoarthritis on the right at C3-4 and C6-7 and on the left at C2-3, and C4-5. No apparent compressive canal or foraminal stenosis. Upper chest: Bullous emphysema at the left apex without visible lung markings. No change from previous. Other: None IMPRESSION: Head CT: No acute or traumatic finding. Mild atrophy and chronic small-vessel change of the white matter. Cervical spine CT: No acute or traumatic finding. Chronic fusion C5-6. Chronic degenerative spondylosis and facet arthropathy. Large bulla at the left lung apex as seen previously. Electronically Signed   By: Nelson Chimes M.D.   On: 05/03/2018 17:28   Ct Cervical Spine Wo Contrast  Result Date: 05/03/2018 CLINICAL DATA:  Laceration to the left side of the face. Confusion. Found on the ground. EXAM: CT HEAD WITHOUT CONTRAST CT CERVICAL SPINE WITHOUT CONTRAST TECHNIQUE: Multidetector CT imaging of the head and cervical spine was performed following the standard protocol without intravenous contrast. Multiplanar CT image reconstructions of the cervical spine were also generated. COMPARISON:  04/26/2018 FINDINGS: CT HEAD FINDINGS Brain: Generalized atrophy. Chronic small-vessel ischemic changes of the white matter. No sign of acute infarction, mass  lesion, hemorrhage, hydrocephalus or extra-axial collection. Physiologic calcification of the basal ganglia. Vascular: No abnormal vascular finding. Skull: No skull fracture. Sinuses/Orbits: Clear/normal Other: Left cheek soft tissue swelling. CT CERVICAL SPINE FINDINGS Alignment: No traumatic malalignment. Skull base and vertebrae: No fracture or primary lesion. Chronic fusion at C5-6. Soft tissues and spinal canal: Negative Disc levels: Facet osteoarthritis on the right at C3-4 and C6-7 and on the left at C2-3, and C4-5. No apparent compressive canal or foraminal stenosis. Upper chest: Bullous emphysema at the left apex without visible lung markings. No change from previous. Other: None IMPRESSION: Head CT: No acute or traumatic finding. Mild atrophy and chronic small-vessel change of the white matter. Cervical spine CT: No acute or traumatic finding. Chronic fusion C5-6. Chronic degenerative spondylosis and facet arthropathy. Large bulla at the left lung apex as seen previously. Electronically Signed  By: Nelson Chimes M.D.   On: 05/03/2018 17:28   Dg Chest Port 1 View  Result Date: 05/03/2018 CLINICAL DATA:  Fall.  Fever. EXAM: PORTABLE CHEST 1 VIEW COMPARISON:  Chest radiograph and chest CT April 26, 2018 FINDINGS: There is a stable large bulla in the left upper lobe. There is no appreciable edema or consolidation. Heart is upper normal in size with pulmonary vascularity normal. No adenopathy. No bone lesions. IMPRESSION: Large left upper lobe bulla. No edema or consolidation. Stable cardiac silhouette. Electronically Signed   By: Lowella Grip III M.D.   On: 05/03/2018 17:51    Assessment/Plan  1.  AKI on CKD d/t obstructive uropathy: Cr is improving day by day.  BUN 78.  Given slow improvement in Cr and improvement in MS since arrival, do not think he needs to initiate dialysis at this time.  Will continue to follow for clinical improvement.  Given improving hydronephrosis on renal US, I think  he'll continue to slowly improve but it remains to be seen how much and how long it will take.     2.  EtOH abuse: CIWA protocol per primary  3.  UTI: on ceftriaxone, being recultured.    4.  Anemia: chronic, stable.  Hgb 8.2.    5.  COVID-19 Suspected infection: being ruled out. Madelon Lips 05/04/2018, 3:31 PM

## 2018-05-04 NOTE — Progress Notes (Signed)
Spoke with RN, Gretta Cool, to inform him that EEG's on PUI patients were only being performed if there was high suspicion for status epilepticus. Gretta Cool stated that he would speak to the patients physician to get clarification.

## 2018-05-04 NOTE — Care Management Obs Status (Signed)
[facility] NOTIFICATION   Patient Details  Name: Matthew [name] MRN: [id number] Date of Birth: 08-May-1950   Medicare Observation Status Notification Given:  Yes    Zenon Mayo, RN 05/04/2018, 1:44 PM

## 2018-05-05 DIAGNOSIS — G9341 Metabolic encephalopathy: Secondary | ICD-10-CM

## 2018-05-05 LAB — COMPREHENSIVE METABOLIC PANEL
ALT: 14 U/L (ref 0–44)
AST: 15 U/L (ref 15–41)
Albumin: 2.1 g/dL — ABNORMAL LOW (ref 3.5–5.0)
Alkaline Phosphatase: 37 U/L — ABNORMAL LOW (ref 38–126)
Anion gap: 9 (ref 5–15)
BUN: 78 mg/dL — ABNORMAL HIGH (ref 8–23)
CO2: 25 mmol/L (ref 22–32)
Calcium: 7.4 mg/dL — ABNORMAL LOW (ref 8.9–10.3)
Chloride: 102 mmol/L (ref 98–111)
Creatinine, Ser: 6.78 mg/dL — ABNORMAL HIGH (ref 0.61–1.24)
GFR calc Af Amer: 9 mL/min — ABNORMAL LOW (ref >60)
GFR calc non Af Amer: 8 mL/min — ABNORMAL LOW (ref >60)
Glucose, Bld: 93 mg/dL (ref 70–99)
Potassium: 4.2 mmol/L (ref 3.5–5.1)
Sodium: 136 mmol/L (ref 135–145)
Total Bilirubin: 0.4 mg/dL (ref 0.3–1.2)
Total Protein: 7.5 g/dL (ref 6.5–8.1)

## 2018-05-05 LAB — CBC WITH DIFFERENTIAL/PLATELET
Abs Immature Granulocytes: 0.05 10*3/uL (ref 0.00–0.07)
Basophils Absolute: 0 10*3/uL (ref 0.0–0.1)
Basophils Relative: 1 %
Eosinophils Absolute: 0.3 10*3/uL (ref 0.0–0.5)
Eosinophils Relative: 4 %
HCT: 25.1 % — ABNORMAL LOW (ref 39.0–52.0)
Hemoglobin: 7.8 g/dL — ABNORMAL LOW (ref 13.0–17.0)
Immature Granulocytes: 1 %
Lymphocytes Relative: 23 %
Lymphs Abs: 1.7 10*3/uL (ref 0.7–4.0)
MCH: 28 pg (ref 26.0–34.0)
MCHC: 31.1 g/dL (ref 30.0–36.0)
MCV: 90 fL (ref 80.0–100.0)
Monocytes Absolute: 0.7 10*3/uL (ref 0.1–1.0)
Monocytes Relative: 10 %
Neutro Abs: 4.4 10*3/uL (ref 1.7–7.7)
Neutrophils Relative %: 61 %
Platelets: 271 10*3/uL (ref 150–400)
RBC: 2.79 MIL/uL — ABNORMAL LOW (ref 4.22–5.81)
RDW: 15.7 % — ABNORMAL HIGH (ref 11.5–15.5)
WBC: 7.2 10*3/uL (ref 4.0–10.5)
nRBC: 0 % (ref 0.0–0.2)

## 2018-05-05 LAB — URINE CULTURE: Culture: NO GROWTH

## 2018-05-05 LAB — GLUCOSE, CAPILLARY
Glucose-Capillary: 137 mg/dL — ABNORMAL HIGH (ref 70–99)
Glucose-Capillary: 74 mg/dL (ref 70–99)
Glucose-Capillary: 139 mg/dL — ABNORMAL HIGH (ref 70–99)
Glucose-Capillary: 126 mg/dL — ABNORMAL HIGH (ref 70–99)

## 2018-05-05 NOTE — Progress Notes (Addendum)
PROGRESS NOTE    Matthew [name]  VBT:[id number] DOB: Feb 06, 1950 DOA: 05/03/2018 PCP: Wendie Agreste, MD   Brief Narrative:  HPI per Dr. Shela Leff on 05/03/2018 Matthew [name] is a 67 y.o. male with medical history significant of type 2 diabetes, CKD stage IV, hypertension presenting to the hospital via EMS after being found down by family at home near a car.  Fall was unwitnessed.  Per EMS, temperature 100.2 F. Patient is a poor historian and limited history could be obtained from him.  States his hands have been shaking all day today.  He had 2 beers this morning.  States he was walking on his porch and then felt lightheaded and blacked out.  Denies having any chest pain or shortness of breath prior to the episode of syncope.  Denies having any fevers or chills.  Denies any cough, abdominal pain, blood in stool, or dysuria.  States he has a Foley catheter and has been having good urine output.  No additional history could be obtained from the patient.  Patient was recently admitted from March 30 to April 5 for AKI on CKD stage IV, metabolic acidosis, and acute UTI due to recurrent self-catheterization.  Patient received IV fluids and his creatinine improved from 13 to 7 range during this hospitalization.  He was discharged home with an indwelling Foley catheter with plan to follow-up with nephrology in 3 days and urology in 2 to 3 weeks.  He was treated with IV Rocephin for his UTI and cultures grew strep, placed on Augmentin with plan to treat as complicated UTI for a total of 10 days.  Patient also had symptomatic anemia due to chronic renal failure with negative Hemoccult.  He was transfused with 2 units PRBCs during this hospitalization.  **Interim History Patient is improved slightly.  Remains withdrawn.  Renal Function is trending down. ECHOCardiogram still pending to be done and PT/OT Evaluations ordered.   Assessment & Plan:   Principal Problem:   Syncope Active Problems:  Anemia, chronic renal failure, stage 4 (severe) (HCC)   Normocytic anemia   Metabolic acidosis   Fall   Acute metabolic encephalopathy   Suspected Covid-19 Virus Infection  Unwitnessed Fall, suspect syncope due to unclear etiology -This could possibly be orthostatic given complaints of dizziness/lightheadedness while ambulating before blacking out.   -Did not have any chest pain prior and encouraged by negative troponin, and nonischemic EKG.   -CT head/C-spine also negative for acute abnormalities. -Orthostatics checked after fluid resuscitation are within normal limits.   -We will follow-up TTE to ensure no changes in EF/wall motion abnormalities and continue to monitor telemetry for arrhythmias. TTE still pending  -Has known CKD but no acute electrolyte abnormalities to explain symptoms additionally feel low likelihood for potential infection he had a low-grade temp of 100.2 per EMS but remained afebrile here, though he was being treated for UTI and continues to have pyuria here we will evaluate -EEG unable to be done as he is a PUI and no suspicion for status epilepticus -IVF now stopped  -PT/OT to evaluate and Treat   Intermittent Confusion, improving -In ED he was unsure of year with reports of hallucinations as well as tongue fasciculations and hand asterixis.   -Continued to have some asterixis on exam for me, alert to self, place but not to time (believes it is 2001))  -CT head shows no acute findings but is consistent with mild atrophy.   -B12 was 515, TSH was 2.440. Ammonia wnl  at 35  -Suspect some of this maybe chronic given atrophy on CT head. -Continue to Monitor CIWA Protocol, folic, thiamine given alcohol abuse,  -Not likely uremia given improvement and stability in BUN/Cr, nephrology will continue to monitor.   Suspected COVID infection, low risk -Less likely given no respiratory symptoms and no current oxygen requirement.   -He potentially has some exposures given he was  recently discharged from the hospital on 4/6.   -COVID PCR pending -Continue Contact/Droplet precautions.  Low-Grade Fever and Leukocytosis, improved -SIRS criteria met on admission given temp of 100.2 leukocytosis of 12 which has since resolved.  -No clear etiology for infection given negative chest x-ray, no localizing symptoms.   -Patient was recently treated for UTI so UA does show some pyuria but rare bacteria,  -Additionally he has a chronic foley so at risk for colonization with urine culture. -WBC is now 7.2 and has been afebrile -Repeat CBC in AM   Chronic Weakness/Dizziness -Probably combination of deconditioning due to recent admission and progression of CKD.   -BUN is improved from prior admission but still some potential for Uremia -Ammonia, UDS, ethanol level, orthostatic vitals are unremarkable.  -No focal deficits on exam and CT head imaging with nothing acute -PT/OT to evaluate and Treat -Continue to Monitor   Pyuria, recently treated for Acute urinary tract infection (Group B Strep Strep Agalactiae) (discharged on Augmentin)  -UA does show some pyuria which is not totally unsurprising given recent treatment for UTI with rare bacteria, patient has chronic  -Foley risk for colonization -Repeat Urinalysis this visit showed Hazy appearance, large hemoglobin, 100 protein, rare bacteria, greater than 50 RBCs per high-power field, greater than 50 WBCs with large leukocytes. -Repeat Urine Cx showed No Growth  -Continue IV Ceftriaxone for now but doubt new infection  -Hopefully can resume oral Augmentin at D/C  AKI on CKD stage V from presumed secondary to urinary obstruction/bilateral hydronephrosis with acute worsening from recent UTI on last admission, stable -GFR less than 15, that and creatinine stable from most recent admission.   -Reports no change in urination has maintained chronic Foley  -Nephrology was consulted given its possible some of his  dizziness/weakness/confusion could potentially be related to uremia though his BUN is much less than his most recent admission -Nephrology following and do not think he will need HD presently, Continue to monitor BUN/Cr and mentation status.  -Continue to monitor Urine output, repeat ultrasound shows improvement in bilateral hydronephrosis. -BUN/Cr is trending down from last admission and now is 78/6.78 -IVF has now stopped  -C/w Calcitriol 0.5 mcg po Daily  -Repeat CMP in AM  Chronic non anion gap metabolic acidosis, secondary to CKD Stage 4 -Improved. AG was 9 and CO2 was 25 -C/w Sodium Bicarbonate 650 mg po BID -Continue to Monitor and repeat CMP  In AM   Ethanol Abuse -Daughter reported he was drinking 1 beer prior to the fall as far as they know.  -Before he was drinking "quite a bit of beer" not in one day. -Hemodynamically stable with no active withdrawal signs currently but was very withdrawn -Ethanol Level was <10 -C/w CIWA monitoring with Lorazepam 1 mg po/IV [id number] -C/w Folic Acid 1 mg po Daily, MVI 1 tab po Daily, and Thiamine 100 mg po/IV Daily   Normocytic Anemia/Anemia of Chronic Kidney Disease  -Patient's Hb/Hct went from 8.8/29.1 -> 8.2/26.1 -> 7.8/25.1 -? Dilutional Drop in the setting of IVF -Check Anemia Panel in the AM  -Continue to Monitor for  S/Sx of Bleeding as patient has Heparin 5,000 units q8h -Ferritin was 439 on Admission -Repeat CBC in AM   DVT prophylaxis: Heparin 5,000 units sq q8h Code Status: FULL CODE Family Communication: No family present at bedside but updated daughter over the phone Disposition Plan: Pending Clinical Improvement  Consultants:   Nephrology   Procedures:  ECHOCARDIOGRAM (ordered and pending)   Antimicrobials:  Anti-infectives (From admission, onward)   Start     Dose/Rate Route Frequency Ordered Stop   05/03/18 2100  cefTRIAXone (ROCEPHIN) 1 g in sodium chloride 0.9 % 100 mL IVPB     1 g 200 mL/hr over 30 Minutes  Intravenous Every 24 hours 05/03/18 2040       Subjective: Seen and examined at bedside and he was withdrawn.  Stated that he did have some abdominal pain from his hypertrophy no nausea or vomiting.  No other concerns or complaints at this time and wanted to be left alone.  Objective: Vitals:   05/04/18 0911 05/04/18 1620 05/05/18 0014 05/05/18 0820  BP: 123/73 124/77 96/64 117/67  Pulse: (!) 103 83 78 72  Resp:  _0 Temp:  98.3 F (36.8 C) 98.4 F (36.9 C) 97.8 F (36.6 C)  TempSrc:  Oral Oral Oral  SpO2:  100% 100% 100%  Weight:      Height:        Intake/Output Summary (Last 24 hours) at 05/05/2018 1353 Last data filed at 05/05/2018 0859 Gross per 24 hour  Intake 682.06 ml  Output 3200 ml  Net -2517.94 ml   Filed Weights   05/03/18 1610 05/03/18 2140  Weight: 69.7 kg 72.4 kg   Examination: Physical Exam:  Constitutional: Thin AAM in NAD and appears calm and but somewhat uncomfortable Eyes: Lids and conjunctivae normal, sclerae anicteric  ENMT: External Ears, Nose appear normal. Grossly normal hearing.  Neck: Appears normal, supple, no cervical masses, normal ROM, no appreciable thyromegaly; no JVD Respiratory: Diminished to auscultation bilaterally, no wheezing, rales, rhonchi or crackles. Normal respiratory effort and patient is not tachypenic. No accessory muscle use.  Cardiovascular: RRR, no murmurs / rubs / gallops. S1 and S2 auscultated. No extremity edema.  Abdomen: Soft, mildly tender, non-distended. No masses palpated. No appreciable hepatosplenomegaly. Bowel sounds positive x4.  GU: Deferred. Musculoskeletal: No clubbing / cyanosis of digits/nails. No joint deformity upper and lower extremities.  Skin: No rashes, lesions, ulcers on a limited skin evaluation. No induration; Warm and dry.  Neurologic: CN 2-12 grossly intact with no focal deficits. Romberg sign and cerebellar reflexes not assessed.  Psychiatric: Normal judgment and insight. Alert and awake.  Depressed and withdrawn mood and flat affect.   Data Reviewed: I have personally reviewed following labs and imaging studies  CBC: Recent Labs  Lab 04/30/18 0619 04/30/18 1603 05/01/18 0443 05/03/18 1623 05/04/18 0309 05/05/18 0529  WBC 5.5  --  7.5 12.5* 8.3 7.2  NEUTROABS  --   --   --  9.3* 5.0 4.4  HGB 6.7* 7.9* 8.0* 8.8* 8.2* 7.8*  HCT 21.0* 24.3* 25.0* 29.1* 26.1* 25.1*  MCV 86.1  --  85.3 89.3 88.8 90.0  PLT 300  --  296 298 274 301   Basic Metabolic Panel: Recent Labs  Lab 04/29/18 0542 04/30/18 0619 05/01/18 0443 05/02/18 0630 05/03/18 1623 05/04/18 0309 05/05/18 0529  NA 136 136 137 137 136 137 136  K 3.1* 3.3* 3.6 4.1 3.8 4.2 4.2  CL 100 100 102 103 102 104 102  CO2  24 21* 22 25 21* 23 25  GLUCOSE 97 95 107* 94 106* 83 93  BUN 111* 97* 91* 84* 79* 78* 78*  CREATININE 10.65* 9.27* 8.34* 7.39* 7.31* 6.89* 6.78*  CALCIUM 6.2* 6.1* 6.1* 6.7* 7.0* 7.1* 7.4*  PHOS 5.2* 4.4 3.6 3.9  --   --   --    GFR: Estimated Creatinine Clearance: 10.8 mL/min (A) (by C-G formula based on SCr of 6.78 mg/dL (H)). Liver Function Tests: Recent Labs  Lab 04/30/18 0619 05/01/18 0443 05/02/18 0630 05/03/18 1623 05/05/18 0529  AST  --   --   --  20 15  ALT  --   --   --  16 14  ALKPHOS  --   --   --  39 37*  BILITOT  --   --   --  0.2* 0.4  PROT  --   --   --  8.6* 7.5  ALBUMIN 2.0* 2.0* 2.0* 2.4* 2.1*   No results for input(s): LIPASE, AMYLASE in the last 168 hours. Recent Labs  Lab 05/03/18 1926  AMMONIA 23   Coagulation Profile: Recent Labs  Lab 05/03/18 1623  INR 1.2   Cardiac Enzymes: Recent Labs  Lab 05/03/18 1623 05/03/18 2149  CKTOTAL 112  --   CKMB 1.6  --   TROPONINI  --  <0.03   BNP (last 3 results) Recent Labs    07/31/17 1540  PROBNP 93.0   HbA1C: Recent Labs    05/04/18 0309  HGBA1C 6.0*   CBG: Recent Labs  Lab 05/04/18 1218 05/04/18 1615 05/04/18 2133 05/05/18 0821 05/05/18 1208  GLUCAP 116* 118* 138* 137* 74   Lipid  Profile: No results for input(s): CHOL, HDL, LDLCALC, TRIG, CHOLHDL, LDLDIRECT in the last 72 hours. Thyroid Function Tests: Recent Labs    05/04/18 1116  TSH 2.440   Anemia Panel: Recent Labs    05/03/18 2149 05/04/18 1116  VITAMINB12  --  505  FERRITIN 539*  --    Sepsis Labs: Recent Labs  Lab 05/03/18 1623 05/03/18 2149  PROCALCITON  --  0.60  LATICACIDVEN 1.1  --    Recent Results (from the past 240 hour(s))  Urine Culture     Status: Abnormal   Collection Time: 04/26/18 11:34 AM  Result Value Ref Range Status   Urine Culture, Routine Final report (A)  Final   Organism ID, Bacteria Comment (A)  Final    Comment: Beta hemolytic Streptococcus, group B Greater than 100,000 colony forming units per mL Penicillin and ampicillin are drugs of choice for treatment of beta-hemolytic streptococcal infections. Susceptibility testing of penicillins and other beta-lactam agents approved by the FDA for treatment of beta-hemolytic streptococcal infections need not be performed routinely because nonsusceptible isolates are extremely rare in any beta-hemolytic streptococcus and have not been reported for Streptococcus pyogenes (group A). (CLSI)   Urine culture     Status: Abnormal   Collection Time: 04/26/18  2:30 PM  Result Value Ref Range Status   Specimen Description   Final    URINE, CLEAN CATCH Performed at [facility], [place] [address] [place], [place] [address]    Special Requests   Final    NONE Performed at [facility], [place] [address], [place], [place] [address]    Culture (A)  Final    >=100,000 COLONIES/mL GROUP B STREP(S.AGALACTIAE)ISOLATED TESTING AGAINST S. AGALACTIAE NOT ROUTINELY PERFORMED DUE TO PREDICTABILITY OF AMP/PEN/VAN SUSCEPTIBILITY. Performed at [facility], [address] [place], [place] [address]    Report Status 04/28/2018 FINAL  Final  Urine culture     Status: None   Collection Time: 05/03/18   5:15 PM  Result Value Ref Range Status   Specimen Description URINE, CATHETERIZED  Final   Special Requests NONE  Final   Culture   Final    NO GROWTH Performed at [facility], [place] [address], [place], [place] [address]    Report Status 05/04/2018 FINAL  Final  Culture, Urine     Status: None   Collection Time: 05/04/18  5:15 AM  Result Value Ref Range Status   Specimen Description URINE, CATHETERIZED  Final   Special Requests NONE  Final   Culture   Final    NO GROWTH Performed at [facility], [place] [address], [place], [place] [address]    Report Status 05/05/2018 FINAL  Final      Radiology Studies: Ct Head Wo Contrast  Result Date: 05/03/2018 CLINICAL DATA:  Laceration to the left side of the face. Confusion. Found on the ground. EXAM: CT HEAD WITHOUT CONTRAST CT CERVICAL SPINE WITHOUT CONTRAST TECHNIQUE: Multidetector CT imaging of the head and cervical spine was performed following the standard protocol without intravenous contrast. Multiplanar CT image reconstructions of the cervical spine were also generated. COMPARISON:  04/26/2018 FINDINGS: CT HEAD FINDINGS Brain: Generalized atrophy. Chronic small-vessel ischemic changes of the white matter. No sign of acute infarction, mass lesion, hemorrhage, hydrocephalus or extra-axial collection. Physiologic calcification of the basal ganglia. Vascular: No abnormal vascular finding. Skull: No skull fracture. Sinuses/Orbits: Clear/normal Other: Left cheek soft tissue swelling. CT CERVICAL SPINE FINDINGS Alignment: No traumatic malalignment. Skull base and vertebrae: No fracture or primary lesion. Chronic fusion at C5-6. Soft tissues and spinal canal: Negative Disc levels: Facet osteoarthritis on the right at C3-4 and C6-7 and on the left at C2-3, and C4-5. No apparent compressive canal or foraminal stenosis. Upper chest: Bullous emphysema at the left apex without visible lung markings. No change from previous. Other: None IMPRESSION:  Head CT: No acute or traumatic finding. Mild atrophy and chronic small-vessel change of the white matter. Cervical spine CT: No acute or traumatic finding. Chronic fusion C5-6. Chronic degenerative spondylosis and facet arthropathy. Large bulla at the left lung apex as seen previously. Electronically Signed   By: Nelson Chimes M.D.   On: 05/03/2018 17:28   Ct Cervical Spine Wo Contrast  Result Date: 05/03/2018 CLINICAL DATA:  Laceration to the left side of the face. Confusion. Found on the ground. EXAM: CT HEAD WITHOUT CONTRAST CT CERVICAL SPINE WITHOUT CONTRAST TECHNIQUE: Multidetector CT imaging of the head and cervical spine was performed following the standard protocol without intravenous contrast. Multiplanar CT image reconstructions of the cervical spine were also generated. COMPARISON:  04/26/2018 FINDINGS: CT HEAD FINDINGS Brain: Generalized atrophy. Chronic small-vessel ischemic changes of the white matter. No sign of acute infarction, mass lesion, hemorrhage, hydrocephalus or extra-axial collection. Physiologic calcification of the basal ganglia. Vascular: No abnormal vascular finding. Skull: No skull fracture. Sinuses/Orbits: Clear/normal Other: Left cheek soft tissue swelling. CT CERVICAL SPINE FINDINGS Alignment: No traumatic malalignment. Skull base and vertebrae: No fracture or primary lesion. Chronic fusion at C5-6. Soft tissues and spinal canal: Negative Disc levels: Facet osteoarthritis on the right at C3-4 and C6-7 and on the left at C2-3, and C4-5. No apparent compressive canal or foraminal stenosis. Upper chest: Bullous emphysema at the left apex without visible lung markings. No change  from previous. Other: None IMPRESSION: Head CT: No acute or traumatic finding. Mild atrophy and chronic small-vessel change of the white matter. Cervical spine CT: No acute or traumatic finding. Chronic fusion C5-6. Chronic degenerative spondylosis and facet arthropathy. Large bulla at the left lung apex as  seen previously. Electronically Signed   By: Nelson Chimes M.D.   On: 05/03/2018 17:28   US Renal  Result Date: 05/04/2018 CLINICAL DATA:  Follow-up hydronephrosis. EXAM: RENAL / URINARY TRACT ULTRASOUND COMPLETE COMPARISON:  April 27, 2018 FINDINGS: Right Kidney: Renal measurements: 10.9 x 4.1 x 5.9 cm = volume: 137.4 mL. Decreasing hydronephrosis, mild in severity today. Left Kidney: Renal measurements: 11.1 x 6.3 x 5.2 cm = volume: 188.3 mL. Decreasing mild hydronephrosis. Bladder: The bladder is decompressed with a Foley catheter. IMPRESSION: Decreasing bilateral hydronephrosis, mild on today's study. Electronically Signed   By: Dorise Bullion III M.D   On: 05/04/2018 15:31   Dg Chest Port 1 View  Result Date: 05/03/2018 CLINICAL DATA:  Fall.  Fever. EXAM: PORTABLE CHEST 1 VIEW COMPARISON:  Chest radiograph and chest CT April 26, 2018 FINDINGS: There is a stable large bulla in the left upper lobe. There is no appreciable edema or consolidation. Heart is upper normal in size with pulmonary vascularity normal. No adenopathy. No bone lesions. IMPRESSION: Large left upper lobe bulla. No edema or consolidation. Stable cardiac silhouette. Electronically Signed   By: Lowella Grip III M.D.   On: 05/03/2018 17:51   Scheduled Meds:  calcitRIOL  0.5 mcg Oral Daily   folic acid  1 mg Oral Daily   heparin  5,000 Units Subcutaneous Q8H   insulin aspart  0-9 Units Subcutaneous TID WC   multivitamin with minerals  1 tablet Oral Daily   sodium bicarbonate  650 mg Oral BID   thiamine  100 mg Oral Daily   Or   thiamine  100 mg Intravenous Daily   Continuous Infusions:  cefTRIAXone (ROCEPHIN)  IV 1 g (05/04/18 2000)    LOS: 1 day    Kerney Elbe, DO [facility] PAGER is on AMION  If 7PM-7AM, please contact night-coverage [email or web address] Password [email or web address] 05/05/2018, 1:53 PM

## 2018-05-05 NOTE — Progress Notes (Signed)
[facility] Progress Note    Assessment/ Plan:    1.  AKI on CKD d/t obstructive uropathy: Cr is improving day by day.  BUN 78.  Given slow improvement in Cr and improvement in MS since arrival, do not think he needs to initiate dialysis at this time.  Will continue to follow for clinical improvement.  Given improving hydronephrosis on renal US, I think he'll continue to slowly improve but it remains to be seen how much and how long it will take.  Avoiding NSAIDs, IV contrast, fleets enemas, mag citrate, PPIs.       2.  EtOH abuse: CIWA protocol per primary  3.  UTI: on ceftriaxone, being recultured.    4.  Anemia: chronic, stable.  Hgb 8.2.    5.  Metabolic acidosis: continue sodium bicarb  6.  COVID-19 Suspected infection: being ruled out.  Subjective:    Continued slow improvement   Objective:   BP 117/67 (BP Location: Left Arm)   Pulse 72   Temp 97.8 F (36.6 C) (Oral)   Resp 16   Ht 5\' 10"  (1.778 m)   Wt 72.4 kg   SpO2 100%   BMI 22.90 kg/m   Intake/Output Summary (Last 24 hours) at 05/05/2018 1442 Last data filed at 05/05/2018 1791 Gross per 24 hour  Intake 682.06 ml  Output 3200 ml  Net -2517.94 ml   Weight change:   Physical Exam: Relying on physical exam performed by hospitalist d/t COVID-19 ruleout  Imaging: Ct Head Wo Contrast  Result Date: 05/03/2018 CLINICAL DATA:  Laceration to the left side of the face. Confusion. Found on the ground. EXAM: CT HEAD WITHOUT CONTRAST CT CERVICAL SPINE WITHOUT CONTRAST TECHNIQUE: Multidetector CT imaging of the head and cervical spine was performed following the standard protocol without intravenous contrast. Multiplanar CT image reconstructions of the cervical spine were also generated. COMPARISON:  04/26/2018 FINDINGS: CT HEAD FINDINGS Brain: Generalized atrophy. Chronic small-vessel ischemic changes of the white matter. No sign of acute infarction, mass lesion, hemorrhage, hydrocephalus or extra-axial  collection. Physiologic calcification of the basal ganglia. Vascular: No abnormal vascular finding. Skull: No skull fracture. Sinuses/Orbits: Clear/normal Other: Left cheek soft tissue swelling. CT CERVICAL SPINE FINDINGS Alignment: No traumatic malalignment. Skull base and vertebrae: No fracture or primary lesion. Chronic fusion at C5-6. Soft tissues and spinal canal: Negative Disc levels: Facet osteoarthritis on the right at C3-4 and C6-7 and on the left at C2-3, and C4-5. No apparent compressive canal or foraminal stenosis. Upper chest: Bullous emphysema at the left apex without visible lung markings. No change from previous. Other: None IMPRESSION: Head CT: No acute or traumatic finding. Mild atrophy and chronic small-vessel change of the white matter. Cervical spine CT: No acute or traumatic finding. Chronic fusion C5-6. Chronic degenerative spondylosis and facet arthropathy. Large bulla at the left lung apex as seen previously. Electronically Signed   By: Nelson Chimes M.D.   On: 05/03/2018 17:28   Ct Cervical Spine Wo Contrast  Result Date: 05/03/2018 CLINICAL DATA:  Laceration to the left side of the face. Confusion. Found on the ground. EXAM: CT HEAD WITHOUT CONTRAST CT CERVICAL SPINE WITHOUT CONTRAST TECHNIQUE: Multidetector CT imaging of the head and cervical spine was performed following the standard protocol without intravenous contrast. Multiplanar CT image reconstructions of the cervical spine were also generated. COMPARISON:  04/26/2018 FINDINGS: CT HEAD FINDINGS Brain: Generalized atrophy. Chronic small-vessel ischemic changes of the white matter. No sign of acute infarction, mass lesion, hemorrhage,  hydrocephalus or extra-axial collection. Physiologic calcification of the basal ganglia. Vascular: No abnormal vascular finding. Skull: No skull fracture. Sinuses/Orbits: Clear/normal Other: Left cheek soft tissue swelling. CT CERVICAL SPINE FINDINGS Alignment: No traumatic malalignment. Skull base  and vertebrae: No fracture or primary lesion. Chronic fusion at C5-6. Soft tissues and spinal canal: Negative Disc levels: Facet osteoarthritis on the right at C3-4 and C6-7 and on the left at C2-3, and C4-5. No apparent compressive canal or foraminal stenosis. Upper chest: Bullous emphysema at the left apex without visible lung markings. No change from previous. Other: None IMPRESSION: Head CT: No acute or traumatic finding. Mild atrophy and chronic small-vessel change of the white matter. Cervical spine CT: No acute or traumatic finding. Chronic fusion C5-6. Chronic degenerative spondylosis and facet arthropathy. Large bulla at the left lung apex as seen previously. Electronically Signed   By: Nelson Chimes M.D.   On: 05/03/2018 17:28   US Renal  Result Date: 05/04/2018 CLINICAL DATA:  Follow-up hydronephrosis. EXAM: RENAL / URINARY TRACT ULTRASOUND COMPLETE COMPARISON:  April 27, 2018 FINDINGS: Right Kidney: Renal measurements: 10.9 x 4.1 x 5.9 cm = volume: 137.4 mL. Decreasing hydronephrosis, mild in severity today. Left Kidney: Renal measurements: 11.1 x 6.3 x 5.2 cm = volume: 188.3 mL. Decreasing mild hydronephrosis. Bladder: The bladder is decompressed with a Foley catheter. IMPRESSION: Decreasing bilateral hydronephrosis, mild on today's study. Electronically Signed   By: Dorise Bullion III M.D   On: 05/04/2018 15:31   Dg Chest Port 1 View  Result Date: 05/03/2018 CLINICAL DATA:  Fall.  Fever. EXAM: PORTABLE CHEST 1 VIEW COMPARISON:  Chest radiograph and chest CT April 26, 2018 FINDINGS: There is a stable large bulla in the left upper lobe. There is no appreciable edema or consolidation. Heart is upper normal in size with pulmonary vascularity normal. No adenopathy. No bone lesions. IMPRESSION: Large left upper lobe bulla. No edema or consolidation. Stable cardiac silhouette. Electronically Signed   By: Lowella Grip III M.D.   On: 05/03/2018 17:51    Labs: BMET Recent Labs  Lab  04/29/18 0542 04/30/18 0619 05/01/18 0443 05/02/18 0630 05/03/18 1623 05/04/18 0309 05/05/18 0529  NA 136 136 137 137 136 137 136  K 3.1* 3.3* 3.6 4.1 3.8 4.2 4.2  CL 100 100 102 103 102 104 102  CO2 24 21* 22 25 21* 23 25  GLUCOSE 97 95 107* 94 106* 83 93  BUN 111* 97* 91* 84* 79* 78* 78*  CREATININE 10.65* 9.27* 8.34* 7.39* 7.31* 6.89* 6.78*  CALCIUM 6.2* 6.1* 6.1* 6.7* 7.0* 7.1* 7.4*  PHOS 5.2* 4.4 3.6 3.9  --   --   --    CBC Recent Labs  Lab 05/01/18 0443 05/03/18 1623 05/04/18 0309 05/05/18 0529  WBC 7.5 12.5* 8.3 7.2  NEUTROABS  --  9.3* 5.0 4.4  HGB 8.0* 8.8* 8.2* 7.8*  HCT 25.0* 29.1* 26.1* 25.1*  MCV 85.3 89.3 88.8 90.0  PLT 296 298 274 271    Medications:    . calcitRIOL  0.5 mcg Oral Daily  . folic acid  1 mg Oral Daily  . heparin  5,000 Units Subcutaneous Q8H  . insulin aspart  0-9 Units Subcutaneous TID WC  . multivitamin with minerals  1 tablet Oral Daily  . sodium bicarbonate  650 mg Oral BID  . thiamine  100 mg Oral Daily   Or  . thiamine  100 mg Intravenous Daily      Madelon Lips, MD [facility] pgr [phone] 05/05/2018, 2:42 PM

## 2018-05-06 ENCOUNTER — Inpatient Hospital Stay (HOSPITAL_COMMUNITY): Payer: Medicare Other

## 2018-05-06 DIAGNOSIS — I371 Nonrheumatic pulmonary valve insufficiency: Secondary | ICD-10-CM

## 2018-05-06 DIAGNOSIS — I361 Nonrheumatic tricuspid (valve) insufficiency: Secondary | ICD-10-CM

## 2018-05-06 LAB — CBC WITH DIFFERENTIAL/PLATELET
Abs Immature Granulocytes: 0.03 10*3/uL (ref 0.00–0.07)
Basophils Absolute: 0 10*3/uL (ref 0.0–0.1)
Basophils Relative: 1 %
Eosinophils Absolute: 0.3 10*3/uL (ref 0.0–0.5)
Eosinophils Relative: 5 %
HCT: 24.2 % — ABNORMAL LOW (ref 39.0–52.0)
Hemoglobin: 7.5 g/dL — ABNORMAL LOW (ref 13.0–17.0)
Immature Granulocytes: 1 %
Lymphocytes Relative: 26 %
Lymphs Abs: 1.7 10*3/uL (ref 0.7–4.0)
MCH: 27.8 pg (ref 26.0–34.0)
MCHC: 31 g/dL (ref 30.0–36.0)
MCV: 89.6 fL (ref 80.0–100.0)
Monocytes Absolute: 0.6 10*3/uL (ref 0.1–1.0)
Monocytes Relative: 10 %
Neutro Abs: 3.7 10*3/uL (ref 1.7–7.7)
Neutrophils Relative %: 57 %
Platelets: 279 10*3/uL (ref 150–400)
RBC: 2.7 MIL/uL — ABNORMAL LOW (ref 4.22–5.81)
RDW: 15.6 % — ABNORMAL HIGH (ref 11.5–15.5)
WBC: 6.3 10*3/uL (ref 4.0–10.5)
nRBC: 0 % (ref 0.0–0.2)

## 2018-05-06 LAB — NOVEL CORONAVIRUS, NAA (HOSP ORDER, SEND-OUT TO REF LAB; TAT 18-24 HRS): SARS-CoV-2, NAA: NOT DETECTED

## 2018-05-06 LAB — ECHOCARDIOGRAM LIMITED
Height: 70 in
Weight: 2553.81 oz

## 2018-05-06 LAB — GLUCOSE, CAPILLARY
Glucose-Capillary: 91 mg/dL (ref 70–99)
Glucose-Capillary: 138 mg/dL — ABNORMAL HIGH (ref 70–99)
Glucose-Capillary: 134 mg/dL — ABNORMAL HIGH (ref 70–99)

## 2018-05-06 LAB — COMPREHENSIVE METABOLIC PANEL
ALT: 13 U/L (ref 0–44)
AST: 15 U/L (ref 15–41)
Albumin: 2 g/dL — ABNORMAL LOW (ref 3.5–5.0)
Alkaline Phosphatase: 36 U/L — ABNORMAL LOW (ref 38–126)
Anion gap: 11 (ref 5–15)
BUN: 74 mg/dL — ABNORMAL HIGH (ref 8–23)
CO2: 22 mmol/L (ref 22–32)
Calcium: 7.4 mg/dL — ABNORMAL LOW (ref 8.9–10.3)
Chloride: 100 mmol/L (ref 98–111)
Creatinine, Ser: 6.7 mg/dL — ABNORMAL HIGH (ref 0.61–1.24)
GFR calc Af Amer: 9 mL/min — ABNORMAL LOW (ref >60)
GFR calc non Af Amer: 8 mL/min — ABNORMAL LOW (ref >60)
Glucose, Bld: 136 mg/dL — ABNORMAL HIGH (ref 70–99)
Potassium: 4.1 mmol/L (ref 3.5–5.1)
Sodium: 133 mmol/L — ABNORMAL LOW (ref 135–145)
Total Bilirubin: 0.3 mg/dL (ref 0.3–1.2)
Total Protein: 7.4 g/dL (ref 6.5–8.1)

## 2018-05-06 MED ORDER — DARBEPOETIN ALFA 60 MCG/0.3ML IJ SOSY
60.0000 ug | PREFILLED_SYRINGE | INTRAMUSCULAR | Status: DC
  Filled 2018-05-06: qty 0.3

## 2018-05-06 MED ORDER — THIAMINE HCL 100 MG PO TABS: 100.0000 mg | ORAL_TABLET | Freq: Every day | ORAL | 0 refills | Status: DC

## 2018-05-06 MED ORDER — FOLIC ACID 1 MG PO TABS: 1.0000 mg | ORAL_TABLET | Freq: Every day | ORAL | 0 refills | Status: DC

## 2018-05-06 MED ORDER — ADULT MULTIVITAMIN W/MINERALS CH: 1.0000 | ORAL_TABLET | Freq: Every day | ORAL | 0 refills | Status: DC

## 2018-05-06 NOTE — Progress Notes (Signed)
[facility] Progress Note    Assessment/ Plan:    1.  AKI on CKD d/t obstructive uropathy: Cr is improving day by day.  BUN 78--> 74.  Given slow improvement in Cr and improvement in MS since arrival, do not think he needs to initiate dialysis at this time.  Will continue to follow for clinical improvement.  Given improving hydronephrosis on renal US, I think he'll continue to slowly improve but it remains to be seen how much and how long it will take.  Avoiding NSAIDs, IV contrast, fleets enemas, mag citrate, PPIs.       2.  EtOH abuse: CIWA protocol per primary  3.  UTI: on ceftriaxone, being recultured, negative    4.  Anemia: chronic, stable.  Hgb 8.2.  Will give Aranesp today.  5.  Metabolic acidosis: continue sodium bicarb  6.  COVID-19 Suspected infection: being ruled out.  Subjective:    Continued slow improvement   Objective:   BP 120/75 (BP Location: Right Arm)   Pulse 84   Temp 98.4 F (36.9 C) (Oral)   Resp 16   Ht 5\' 10"  (1.778 m)   Wt 72.4 kg   SpO2 100%   BMI 22.90 kg/m   Intake/Output Summary (Last 24 hours) at 05/06/2018 1153 Last data filed at 05/06/2018 0500 Gross per 24 hour  Intake 100 ml  Output 2350 ml  Net -2250 ml   Weight change:   Physical Exam: Relying on physical exam performed by hospitalist d/t COVID-19 ruleout  Imaging: US Renal  Result Date: 05/04/2018 CLINICAL DATA:  Follow-up hydronephrosis. EXAM: RENAL / URINARY TRACT ULTRASOUND COMPLETE COMPARISON:  April 27, 2018 FINDINGS: Right Kidney: Renal measurements: 10.9 x 4.1 x 5.9 cm = volume: 137.4 mL. Decreasing hydronephrosis, mild in severity today. Left Kidney: Renal measurements: 11.1 x 6.3 x 5.2 cm = volume: 188.3 mL. Decreasing mild hydronephrosis. Bladder: The bladder is decompressed with a Foley catheter. IMPRESSION: Decreasing bilateral hydronephrosis, mild on today's study. Electronically Signed   By: Dorise Bullion III M.D   On: 05/04/2018 15:31     Labs: BMET Recent Labs  Lab 04/30/18 6010 05/01/18 9323 05/02/18 0630 05/03/18 1623 05/04/18 0309 05/05/18 0529 05/06/18 0339  NA 136 137 137 136 137 136 133*  K 3.3* 3.6 4.1 3.8 4.2 4.2 4.1  CL 100 102 103 102 104 102 100  CO2 21* 22 25 21* 23 25 22   GLUCOSE 95 107* 94 106* 83 93 136*  BUN 97* 91* 84* 79* 78* 78* 74*  CREATININE 9.27* 8.34* 7.39* 7.31* 6.89* 6.78* 6.70*  CALCIUM 6.1* 6.1* 6.7* 7.0* 7.1* 7.4* 7.4*  PHOS 4.4 3.6 3.9  --   --   --   --    CBC Recent Labs  Lab 05/03/18 1623 05/04/18 0309 05/05/18 0529 05/06/18 0339  WBC 12.5* 8.3 7.2 6.3  NEUTROABS 9.3* 5.0 4.4 3.7  HGB 8.8* 8.2* 7.8* 7.5*  HCT 29.1* 26.1* 25.1* 24.2*  MCV 89.3 88.8 90.0 89.6  PLT 298 274 271 279    Medications:    . calcitRIOL  0.5 mcg Oral Daily  . folic acid  1 mg Oral Daily  . heparin  5,000 Units Subcutaneous Q8H  . insulin aspart  0-9 Units Subcutaneous TID WC  . multivitamin with minerals  1 tablet Oral Daily  . sodium bicarbonate  650 mg Oral BID  . thiamine  100 mg Oral Daily   Or  . thiamine  100 mg Intravenous Daily  Madelon Lips, MD [facility] pgr [phone] 05/06/2018, 11:53 AM

## 2018-05-06 NOTE — Progress Notes (Addendum)
This patient was screened by PT/OT for appropriateness of in-person evaluation due to pending Covid-19 testing. Based on AMPAC score and clinical judgement, feel this patient is appropriate for return home with home health services and the following DME: None. Consulted with OT regarding plan of care. Will continue to monitor for acute therapy needs.    05/06/18 0900  PT Visit Information  Last PT Received On 05/06/18  Precautions  Precaution Comments Pt reports 1 fall other than incident that brought him to ED. States he tripped over his shoelaces.   Home Living  Family/patient expects to be discharged to: Private residence  Living Arrangements Children;Other relatives  Available Help at Discharge Family;Available 24 hours/day (2 daughters and a granddaughter)  Type of [place] to enter  Entrance Stairs-Number of Steps 3  Entrance Stairs-Rails Right;Left;Can reach both  Home Layout One level  Bathroom Higher education careers adviser - 2 wheels;Cane - single point;Grab bars - tub/shower  Prior Function  Level of [place] Needs assistance  Gait / Transfers Assistance Needed Not using any DME PTA  ADL's / Homemaking Assistance Needed Family provides set up for washing ("gets the water going in the shower for me."); Pt states he manages his own medications.   Comments Daughter drives and does the grocery shopping, cooking, cleaning. Pt does not drive.   Communication  Communication HOH  AM-PAC PT "6 Clicks" Mobility Outcome Measure (Version 2)  Help needed turning from your back to your side while in a flat bed without using bedrails? 4  Help needed moving from lying on your back to sitting on the side of a flat bed without using bedrails? 4  Help needed moving to and from a bed to a chair (including a wheelchair)? 3  Help needed standing up from a chair using your arms (e.g., wheelchair or bedside chair)? 4  Help  needed to walk in hospital room? 3  Help needed climbing 3-5 steps with a railing?  3  6 Click Score 21  Consider Recommendation of Discharge To: Home with no services   Rolinda Roan, PT, DPT Acute Rehabilitation Services Pager: [phone] Office: [phone]

## 2018-05-06 NOTE — Progress Notes (Signed)
OT Note  Interviewed patient over the phone and discussed with nursing staff and PT. Pt walking to the bathroom with nursing staff and appears to be doing well on RA.  Pt able to give information regarding functional status over the phone. Pt received an AMPAC score of 21 (patients with scores of 18 and below will receive a physical assessment/ based on clinical judgement). Recommend pt DC home with a 3in1 to use as a shower seat. Pt states his family will be able to assist with ADL tasks after DC. Educated pt on strategies to reduce risk of falls. Will leave Fall prevention handout and HEP for nursing to take to patient. Will follow up acutely if needed.  Thank you.    05/06/18 1200  OT Visit Information  Last OT Received On 15/72/62  AM-PAC OT "6 Clicks" Daily Activity Outcome Measure (Version 2)  Help from another person eating meals? 4  Help from another person taking care of personal grooming? 4  Help from another person toileting, which includes using toliet, bedpan, or urinal? 3  Help from another person bathing (including washing, rinsing, drying)? 3  Help from another person to put on and taking off regular upper body clothing? 4  Help from another person to put on and taking off regular lower body clothing? 3  6 Click Score 21  OT Recommendation  Follow Up Recommendations Supervision/Assistance - 24 hour  OT Equipment 3 in 1 bedside commode  Maurie Boettcher, OT/L   Acute OT Clinical Specialist [place] Pager [phone] Office [phone]

## 2018-05-06 NOTE — Discharge Summary (Signed)
Physician Discharge Summary  Matthew [name] IDP:[id number] DOB: September 14, 1950 DOA: 05/03/2018  PCP: Wendie Agreste, MD  Admit date: 05/03/2018 Discharge date: 05/06/2018  Admitted From: Home Disposition: Home  Recommendations for Outpatient Follow-up:  1. Follow up with PCP in 1-2 weeks 2. Follow up with Nephrology in 1 week 3. Follow up with Urology in 2-3 Weeks 4. Have EEG Done in the outpatient setting  5. Please obtain CMP/CBC, Mag, Phos in one week 6. Please follow up on the following pending results:  Home Health: No  Equipment/Devices: 3in1   Discharge Condition: Stable CODE STATUS: FULL CODE  Diet recommendation: Heart Healthy Diet   Brief/Interim Summary: HPI per Dr. Shela Leff on 05/03/2018 Matthew [name] a 68 y.o.malewith medical history significant oftype 2 diabetes, CKD stage IV, hypertension presenting to the hospital via [place] being found down by family at home near a car. Fall was unwitnessed. Per EMS, temperature 100.2 F. Patient is a poor historian and limited history could be obtained from him. States his hands have been shaking all day today. He had 2beers this morning. States he was walking on his porch and then felt lightheaded and blacked out. Denies having any chest pain or shortness of breath prior to the episode of syncope. Denies having any fevers or chills. Denies any cough, abdominal pain, blood in stool, or dysuria. States he has a Foley catheter and has been having good urine output.No additional history could be obtained from the patient.  Patient was recently admitted from March 30 to April 5 for AKI on CKD stage IV, metabolic acidosis, and acute UTI due to recurrent self-catheterization. Patient received IV fluids and his creatinine improved from 13 to7 range during this hospitalization. He was discharged home with an indwelling Foley catheter with plan to follow-up with nephrology in 3 days and urology in 2 to 3 weeks. He was  treated with IV Rocephin for his UTI and cultures grew strep, placed on Augmentin with plan to treat as complicated UTI for a total of 10 days. Patient also had symptomatic anemia due to chronic renal failure with negative Hemoccult. He was transfused with 2 units PRBCs during this hospitalization.  **Interim History Patient is improved.  Remained withdrawn yesterday but was more alert and interactive today.  Renal Function is trending down slowly. ECHOCardiogram done and showed EF of 60-65% and PT/OT Evaluations done over the phone and recommended No follow up. Ruled out for COVID-19 disease improved significantly.  He is due medically stable to discharge home will need to follow-up with PCP and nephrology as well as urology in the outpatient setting.  I discussed the case with nephrologist Dr. Madelon Lips who states is okay to discharge the patient from a Renal standpoint at this time.  Discharge Diagnoses:  Principal Problem:   Syncope Active Problems:   Anemia, chronic renal failure, stage 4 (severe) (HCC)   Normocytic anemia   Metabolic acidosis   Fall   Acute metabolic encephalopathy   Suspected Covid-19 Virus Infection  Unwitnessed Fall, suspect syncope dueto unclear etiology -This could possibly be orthostatic given complaints of dizziness/lightheadedness while ambulating before blacking out.  -Did not have any chest pain prior and encouraged by negative troponin, and nonischemic EKG.  -CT head/C-spine also negative for acute abnormalities.  -Orthostatics were checked after fluid resuscitation are within normal limits.  -We will follow-up TTE to ensure no changes in EF/wall motion abnormalities and continue to monitor telemetry for arrhythmias. TTE done today and showed EF of 60-65% with  normal Cavity size and LV Diastolic Parameters being normal. There was Moderate thickening of the Aortic Valve and Moderate Calcification of the Aortic Valve -Has known CKD but no acute  electrolyte abnormalities to explain symptoms additionally feel low likelihood for potential infection he had a low-grade temp of 100.2 per EMS but remained afebrile here, though he was being treated for UTI and continues to have pyuria here we will evaluate -EEG unable to be done as he is a PUI and no suspicion for status epilepticus so will recommend outpatient evaluation -IVF now stopped  -PT/OT to evaluate and Treat and interviewed the patient and recommended no follow up  -Follow up with PCP within 1 week   Intermittent Confusion, improved -In ED he was unsure of year with reports of hallucinations as well as tongue fasciculations and hand asterixis. -Continued to have some asterixis on exam for Dr. Lonny Prude and was alert to self, place but not to time (believed itis 2001); Much improved now  -CT head shows no acute findings but is consistent with mild atrophy.  -B12 was 515, TSH was 2.440. Ammonia wnl at 23  -Suspect some of this maybe chronic given atrophy on CT head. -Continue to Monitor CIWA Protocol, folic, thiamine given alcohol abuse,  -Not likely uremia given improvement and stability in BUN/Cr, nephrology will continue to monitor. -Will need outpatient EEG  -Follow up with PCP within 1 week.   Suspected COVID infection, low risk, ruled out -Less likely given no respiratory symptoms and no current oxygen requirement.  -He potentially has some exposures given he was recently discharged from the hospital on 4/6.  -SARS-CoV-2 Negative and I feel this is low probability that this is a false Negative -Discontinued Contact/Droplet precautions.  Low-Grade Fever and Leukocytosis, improved -SIRS criteria met on admission given temp of 100.2 leukocytosis of 12which has since resolved.  -No clear etiology for infection given negative chest x-ray, no localizing symptoms.  -Patient was recently treated for UTI so UA does show some pyuria but rare bacteria,  -Additionally he has a  chronic foley so at risk for colonization with urine culture. -Procalcitonin Level was 0.60 -WBC is now 6.3 and has been afebrile -Repeat CBC within 1 week  Chronic Weakness/Dizziness -Probably combination of deconditioning due to recent admission and progression of CKD.  -BUN is improved from prior admission but still some potential for Uremia -Ammonia, UDS, ethanol level, orthostatic vitalsare unremarkable.  -ECHO as below -No focal deficits on exam and CT head imaging with nothing acute -PT/OT to evaluate and Treat and recommended no follow up -Continue to Monitor and follow up with PCP  Pyuria, recently treated for Acute urinary tract infection (Group B Strep Strep Agalactiae) (discharged on Augmentin) -UA does show some pyuria which is not totally unsurprising given recent treatment for UTI with rare bacteria, patient has chronic  -Foley risk for colonization -Repeat Urinalysis this visit showed Hazy appearance, large hemoglobin, 100 protein, rare bacteria, greater than 50 RBCs per high-power field, greater than 50 WBCs with large leukocytes. -Repeat Urine Cx showed No Growth x2 -Continued IV Ceftriaxone for now but doubt new infection  -Will stop All Abx now   AKI on CKD stage V from presumed secondary to urinary obstruction/bilateral hydronephrosis with acute worsening from recent UTI on last admission, stable and improving  -GFR less than 15, that and creatinine stable from most recent admission.  -Reports no change in urination has maintained chronic Foley  -Nephrology was consulted given its possible someofhis dizziness/weakness/confusioncould potentially  be related to uremia though his BUN is much less than his most recent admission -Nephrology was following and do not think he will need HD presently,  -Continue to monitor BUN/Cr and mentation status as an outpatient . -Continue to monitor Urine output,repeat ultrasound shows improvement in bilateral  hydronephrosis. -BUN/Cr is trending down from last admission and now is 74/6.70 -IVF has now stopped  -C/w Calcitriol 0.5 mcg po Daily  -Discussed the case with Dr. Madelon Lips who recommends that the patient can be discharged at any time with nephrology follow-up within 1 week. -Repeat CMP as an outpatient   Chronic Non anion gap metabolic acidosis, secondary to CKD Stage 4 -Improved. AG was 11 and CO2 was 22 -C/w Sodium Bicarbonate 650 mg po BID and Calcitriol at D/C -Continue to Monitor and repeat CMP  In AM   Ethanol Abuse -Daughter reported he was drinking 1 beer prior to the fall as far as they know.  -Before he was drinking "quite a bit of beer" not in one day.  -Hemodynamically stable with no active withdrawal signs and appeared improved  -Ethanol Level was <10 -Continued  CIWA monitoring with Lorazepam 1 mg po/IV [id number] -C/w Folic Acid 1 mg po Daily, MVI 1 tab po Daily, and Thiamine 100 mg po Daily at D/C -Recommending to cut down on Alcohol Consumption  Normocytic Anemia/Anemia of Chronic Kidney Disease  -Patient's Hb/Hct went from 8.8/29.1 -> 8.2/26.1 -> 7.8/25.1-> 7.5/24.2 -? Dilutional Drop in the setting of IVF -Check Anemia Panel as an outpatient  -Continue to Monitor for S/Sx of Bleeding as patient has Heparin 5,000 units q8h -Ferritin was 439 on Admission -Repeat CBC within 1 week   Hyponatremia -Mild at 133 -Continue to Monitor and repeat in the outpatient setting with PCP   Discharge Instructions  Discharge Instructions    Call MD for:  difficulty breathing, headache or visual disturbances   Complete by:  As directed    Call MD for:  extreme fatigue   Complete by:  As directed    Call MD for:  hives   Complete by:  As directed    Call MD for:  persistant dizziness or light-headedness   Complete by:  As directed    Call MD for:  persistant nausea and vomiting   Complete by:  As directed    Call MD for:  redness, tenderness, or signs of infection  (pain, swelling, redness, odor or green/yellow discharge around incision site)   Complete by:  As directed    Call MD for:  severe uncontrolled pain   Complete by:  As directed    Call MD for:  temperature >100.4   Complete by:  As directed    Diet - low sodium heart healthy   Complete by:  As directed    Discharge instructions   Complete by:  As directed    You were cared for by a hospitalist during your hospital stay. If you have any questions about your discharge medications or the care you received while you were in the hospital after you are discharged, you can call the unit and ask to speak with the hospitalist on call if the hospitalist that took care of you is not available. Once you are discharged, your primary care physician will handle any further medical issues. Please note that NO REFILLS for any discharge medications will be authorized once you are discharged, as it is imperative that you return to your primary care physician (or establish a relationship  with a primary care physician if you do not have one) for your aftercare needs so that they can reassess your need for medications and monitor your lab values.  Follow up with PCP and avoid Alcohol. Have PCP refer for an outpatient EEG. Take all medications as prescribed. If symptoms change or worsen please return to the ED for evaluation   Increase activity slowly   Complete by:  As directed      Allergies as of 05/06/2018   No Known Allergies     Medication List    STOP taking these medications   amoxicillin-clavulanate 500-125 MG tablet Commonly known as:  AUGMENTIN   ibuprofen 200 MG tablet Commonly known as:  ADVIL,MOTRIN     TAKE these medications   calcitRIOL 0.5 MCG capsule Commonly known as:  ROCALTROL Take 1 capsule (0.5 mcg total) by mouth daily.   folic acid 1 MG tablet Commonly known as:  FOLVITE Take 1 tablet (1 mg total) by mouth daily. Start taking on:  May 07, 2018   multivitamin with minerals  Tabs tablet Take 1 tablet by mouth daily. Start taking on:  May 07, 2018   sodium bicarbonate 650 MG tablet Take 1 tablet (650 mg total) by mouth 2 (two) times daily.   thiamine 100 MG tablet Take 1 tablet (100 mg total) by mouth daily. Start taking on:  May 07, 2018            Durable Medical Equipment  (From admission, onward)         Start     Ordered   05/06/18 1706  DME 3-in-1  Once     05/06/18 1708          No Known Allergies  Consultations:  Nephrology  Procedures/Studies: Ct Head Wo Contrast  Result Date: 05/03/2018 CLINICAL DATA:  Laceration to the left side of the face. Confusion. Found on the ground. EXAM: CT HEAD WITHOUT CONTRAST CT CERVICAL SPINE WITHOUT CONTRAST TECHNIQUE: Multidetector CT imaging of the head and cervical spine was performed following the standard protocol without intravenous contrast. Multiplanar CT image reconstructions of the cervical spine were also generated. COMPARISON:  04/26/2018 FINDINGS: CT HEAD FINDINGS Brain: Generalized atrophy. Chronic small-vessel ischemic changes of the white matter. No sign of acute infarction, mass lesion, hemorrhage, hydrocephalus or extra-axial collection. Physiologic calcification of the basal ganglia. Vascular: No abnormal vascular finding. Skull: No skull fracture. Sinuses/Orbits: Clear/normal Other: Left cheek soft tissue swelling. CT CERVICAL SPINE FINDINGS Alignment: No traumatic malalignment. Skull base and vertebrae: No fracture or primary lesion. Chronic fusion at C5-6. Soft tissues and spinal canal: Negative Disc levels: Facet osteoarthritis on the right at C3-4 and C6-7 and on the left at C2-3, and C4-5. No apparent compressive canal or foraminal stenosis. Upper chest: Bullous emphysema at the left apex without visible lung markings. No change from previous. Other: None IMPRESSION: Head CT: No acute or traumatic finding. Mild atrophy and chronic small-vessel change of the white matter. Cervical  spine CT: No acute or traumatic finding. Chronic fusion C5-6. Chronic degenerative spondylosis and facet arthropathy. Large bulla at the left lung apex as seen previously. Electronically Signed   By: Nelson Chimes M.D.   On: 05/03/2018 17:28   Ct Head Wo Contrast  Result Date: 04/26/2018 CLINICAL DATA:  Altered mental status.  Cough for 3 weeks. EXAM: CT HEAD WITHOUT CONTRAST TECHNIQUE: Contiguous axial images were obtained from the base of the skull through the vertex without intravenous contrast. COMPARISON:  None. FINDINGS:  Brain: No evidence of acute infarction, hemorrhage, hydrocephalus, extra-axial collection or mass lesion/mass effect. Mild atrophy and chronic microvascular ischemic change noted. Vascular: No hyperdense vessel or unexpected calcification. Skull: Negative. Sinuses/Orbits: Negative. Other: None. IMPRESSION: No acute abnormality. Mild atrophy and chronic microvascular ischemic change. Electronically Signed   By: Inge Rise M.D.   On: 04/26/2018 13:41   Ct Chest Wo Contrast  Result Date: 04/26/2018 CLINICAL DATA:  Cough. EXAM: CT CHEST WITHOUT CONTRAST TECHNIQUE: Multidetector CT imaging of the chest was performed following the standard protocol without IV contrast. COMPARISON:  Radiograph of same day. FINDINGS: Cardiovascular: Atherosclerosis of thoracic aorta is noted without aneurysm formation. Normal cardiac size. No pericardial effusion. Mediastinum/Nodes: No enlarged mediastinal or axillary lymph nodes. Thyroid gland, trachea, and esophagus demonstrate no significant findings. Lungs/Pleura: No pneumothorax or pleural effusion is noted. Large solitary bulla is noted in left upper lobe measuring 15 x 10 cm. Right lung is clear. No pulmonary consolidation is noted. Upper Abdomen: There appears to be bilateral hydronephrosis present of unknown etiology. No definite calculi are noted. Musculoskeletal: No chest wall mass or suspicious bone lesions identified. IMPRESSION: Large  solitary bulla is noted in left upper lobe measuring 15 x 10 cm. No pneumothorax or pulmonary consolidation is noted. Incidental note is made of bilateral hydronephrosis seen in visualized portion of upper abdomen. Further evaluation with renal ultrasound or CT urogram is recommended. Aortic Atherosclerosis (ICD10-I70.0). Electronically Signed   By: Marijo Conception, M.D.   On: 04/26/2018 13:46   Ct Cervical Spine Wo Contrast  Result Date: 05/03/2018 CLINICAL DATA:  Laceration to the left side of the face. Confusion. Found on the ground. EXAM: CT HEAD WITHOUT CONTRAST CT CERVICAL SPINE WITHOUT CONTRAST TECHNIQUE: Multidetector CT imaging of the head and cervical spine was performed following the standard protocol without intravenous contrast. Multiplanar CT image reconstructions of the cervical spine were also generated. COMPARISON:  04/26/2018 FINDINGS: CT HEAD FINDINGS Brain: Generalized atrophy. Chronic small-vessel ischemic changes of the white matter. No sign of acute infarction, mass lesion, hemorrhage, hydrocephalus or extra-axial collection. Physiologic calcification of the basal ganglia. Vascular: No abnormal vascular finding. Skull: No skull fracture. Sinuses/Orbits: Clear/normal Other: Left cheek soft tissue swelling. CT CERVICAL SPINE FINDINGS Alignment: No traumatic malalignment. Skull base and vertebrae: No fracture or primary lesion. Chronic fusion at C5-6. Soft tissues and spinal canal: Negative Disc levels: Facet osteoarthritis on the right at C3-4 and C6-7 and on the left at C2-3, and C4-5. No apparent compressive canal or foraminal stenosis. Upper chest: Bullous emphysema at the left apex without visible lung markings. No change from previous. Other: None IMPRESSION: Head CT: No acute or traumatic finding. Mild atrophy and chronic small-vessel change of the white matter. Cervical spine CT: No acute or traumatic finding. Chronic fusion C5-6. Chronic degenerative spondylosis and facet arthropathy.  Large bulla at the left lung apex as seen previously. Electronically Signed   By: Nelson Chimes M.D.   On: 05/03/2018 17:28   US Renal  Result Date: 05/04/2018 CLINICAL DATA:  Follow-up hydronephrosis. EXAM: RENAL / URINARY TRACT ULTRASOUND COMPLETE COMPARISON:  April 27, 2018 FINDINGS: Right Kidney: Renal measurements: 10.9 x 4.1 x 5.9 cm = volume: 137.4 mL. Decreasing hydronephrosis, mild in severity today. Left Kidney: Renal measurements: 11.1 x 6.3 x 5.2 cm = volume: 188.3 mL. Decreasing mild hydronephrosis. Bladder: The bladder is decompressed with a Foley catheter. IMPRESSION: Decreasing bilateral hydronephrosis, mild on today's study. Electronically Signed   By: Dorise Bullion III M.D  On: 05/04/2018 15:31   US Renal  Result Date: 04/27/2018 CLINICAL DATA:  Acute on chronic renal insufficiency. EXAM: RENAL / URINARY TRACT ULTRASOUND COMPLETE COMPARISON:  None. FINDINGS: Right Kidney: Renal measurements: 11.0 x 5.1 x 3.7 cm = volume: 108 mL. Increased echogenicity. Moderate to severe hydronephrosis. No mass visualized. Left Kidney: Renal measurements: 12.5 x 6.7 x 4.6 cm = volume: 203 mL. Increased echogenicity. Moderate hydronephrosis. No mass visualized. Bladder: Decompressed by Foley catheter. Bladder wall thickening may be related to underdistention. IMPRESSION: 1. Moderate to severe right and moderate left hydronephrosis. 2. Bilateral increased renal cortical echogenicity, consistent with medical renal disease. Electronically Signed   By: Titus Dubin M.D.   On: 04/27/2018 12:51   Dg Chest Port 1 View  Result Date: 05/03/2018 CLINICAL DATA:  Fall.  Fever. EXAM: PORTABLE CHEST 1 VIEW COMPARISON:  Chest radiograph and chest CT April 26, 2018 FINDINGS: There is a stable large bulla in the left upper lobe. There is no appreciable edema or consolidation. Heart is upper normal in size with pulmonary vascularity normal. No adenopathy. No bone lesions. IMPRESSION: Large left upper lobe bulla. No  edema or consolidation. Stable cardiac silhouette. Electronically Signed   By: Lowella Grip III M.D.   On: 05/03/2018 17:51   Dg Chest Port 1 View  Result Date: 04/26/2018 CLINICAL DATA:  Cough for 2 weeks. EXAM: PORTABLE CHEST 1 VIEW COMPARISON:  03/20/2017 FINDINGS: The cardiomediastinal silhouette is unchanged with borderline to mild cardiomegaly. Aortic atherosclerosis is noted. There is chronic lucency in the left upper hemithorax with a large apical bulla shown on multiple prior studies, however this area is more hyperlucent on today's study now with a suspected pleural line at the level of the aortic knob concerning for a new pneumothorax. Mild scarring and/or subsegmental atelectasis is present in the left mid lung. No pleural effusion is identified. No acute osseous abnormality is seen. IMPRESSION: Chronic lucency in the left upper hemithorax with different appearance on today's study concerning for a pneumothorax superimposed on bullous disease. Chest CT could be considered for further evaluation. Critical Value/emergent results were called by telephone at the time of interpretation on 04/26/2018 at 1:15 pm to Dr. Quintella Reichert , who verbally acknowledged these results. Electronically Signed   By: Logan Bores M.D.   On: 04/26/2018 13:18    ECHOCARDIOGRAM IMPRESSIONS    1. The left ventricle has normal systolic function with an ejection fraction of 60-65%. The cavity size was normal. Left ventricular diastolic parameters were normal.  2. The right ventricle has normal systolic function. The cavity was normal. There is no increase in right ventricular wall thickness.  3. The aortic valve is tricuspid. Moderate thickening of the aortic valve. Moderate calcification of the aortic valve. Aortic valve regurgitation is mild to moderate by color flow Doppler.  FINDINGS  Left Ventricle: The left ventricle has normal systolic function, with an ejection fraction of 60-65%. The cavity size was  normal. There is no increase in left ventricular wall thickness. Left ventricular diastolic parameters were normal. Right Ventricle: The right ventricle has normal systolic function. The cavity was normal. There is no increase in right ventricular wall thickness. Left Atrium: Left atrial size was normal in size. Right Atrium: Right atrial size was normal in size. Right atrial pressure is estimated at 3 mmHg. Interatrial Septum: No atrial level shunt detected by color flow Doppler. Pericardium: There is no evidence of pericardial effusion. Mitral Valve: The mitral valve is normal in structure. Mitral valve  regurgitation is trivial by color flow Doppler. Tricuspid Valve: The tricuspid valve is normal in structure. Tricuspid valve regurgitation is mild by color flow Doppler. Aortic Valve: The aortic valve is tricuspid Moderate thickening of the aortic valve. Moderate calcification of the aortic valve. Aortic valve regurgitation is mild to moderate by color flow Doppler. There is no evidence of aortic valve stenosis. Pulmonic Valve: The pulmonic valve was grossly normal. Pulmonic valve regurgitation is mild by color flow Doppler.   LEFT VENTRICLE PLAX 2D LVIDd:         4.50 cm   Diastology LVIDs:         3.20 cm   LV e' lateral:   10.80 cm/s LV PW:         0.90 cm   LV E/e' lateral: 10.7 LV IVS:        0.70 cm   LV e' medial:    9.14 cm/s LVOT diam:     2.40 cm   LV E/e' medial:  12.7 LV SV:         51 ml LV SV Index:   27.21 LVOT Area:     4.52 cm  LEFT ATRIUM         Index LA diam:    3.10 cm 1.63 cm/m  AORTIC VALVE AR PHT:      348 msec   AORTA Ao Root diam: 3.00 cm Ao Asc diam:  3.10 cm  MITRAL VALVE MV Area (PHT): 3.37 cm     SHUNTS MV PHT:        65.25 msec   Systemic Diam: 2.40 cm MV Decel Time: 225 msec MV E velocity: 116.00 cm/s MV A velocity: 116.00 cm/s MV E/A ratio:  1.00  Subjective: Was ambulating without assistance and doing well.  Denies chest pain,  lightheadedness or dizziness.  No nausea or vomiting.  States that he felt well and had no other concerns or complaints at this time.  Was wanting to go home.   Discharge Exam: Vitals:   05/05/18 2258 05/06/18 0848  BP: 111/67 120/75  Pulse: 73 84  Resp: 14 16  Temp: 98.5 F (36.9 C) 98.4 F (36.9 C)  SpO2: 99% 100%   Vitals:   05/05/18 0820 05/05/18 1623 05/05/18 2258 05/06/18 0848  BP: 117/67 123/78 111/67 120/75  Pulse: 72  73 84  Resp: 16  14 16   Temp: 97.8 F (36.6 C) 98.5 F (36.9 C) 98.5 F (36.9 C) 98.4 F (36.9 C)  TempSrc: Oral Oral Oral Oral  SpO2: 100% 99% 99% 100%  Weight:      Height:       General: Pt is alert, awake, not in acute distress Cardiovascular: RRR, S1/S2 +, no rubs, no gallops Respiratory: Diminished bilaterally, no wheezing, no rhonchi Abdominal: Soft, NT, ND, bowel sounds + Extremities: no edema, no cyanosis GU: Has foley in place with a significant amount of urine in foley bag  The results of significant diagnostics from this hospitalization (including imaging, microbiology, ancillary and laboratory) are listed below for reference.    Microbiology: Recent Results (from the past 240 hour(s))  Urine culture     Status: None   Collection Time: 05/03/18  5:15 PM  Result Value Ref Range Status   Specimen Description URINE, CATHETERIZED  Final   Special Requests NONE  Final   Culture   Final    NO GROWTH Performed at [facility], [address], [place], [place] [address]    Report  Status 05/04/2018 FINAL  Final  Novel Coronavirus, NAA (hospital order; send-out to ref lab)     Status: None   Collection Time: 05/03/18 10:13 PM  Result Value Ref Range Status   SARS-CoV-2, NAA NOT DETECTED NOT DETECTED Final    Comment: Negative (Not Detected) results do not exclude infection caused by SARS CoV 2 and should not be used as the sole basis for treatment or other patient management decisions. Optimum specimen types and timing for peak  viral levels during infections caused  by SARS CoV 2 have not been determined. Collection of multiple specimens (types and time points) from the same patient may be necessary to detect the virus. Improper specimen collection and handling, sequence variability underlying assay primers and or probes, or the presence of organisms in  quantities less than the limit of detection of the assay may lead to false negative results. Positive and negative predictive values of testing are highly dependent on prevalence. False negative results are more likely when prevalence of disease is high. (NOTE) The expected result is Negative (Not Detected). The SARS CoV 2 test is intended for the presumptive qualitative  detection of nucleic acid from SARS CoV 2 in upper and lower  respir atory specimens. Testing methodology is real time RT PCR. Test results must be correlated with clinical presentation and  evaluated in the context of other laboratory and epidemiologic data.  Test performance can be affected because the epidemiology and  clinical spectrum of infection caused by SARS CoV 2 is not fully  known. For example, the optimum types of specimens to collect and  when during the course of infection these specimens are most likely  to contain detectable viral RNA may not be known. This test has not been Food and Drug Administration (FDA) cleared or  approved and has been authorized by FDA under an Emergency Use  Authorization (EUA). The test is only authorized for the duration of  the declaration that circumstances exist justifying the authorization  of emergency use of in vitro diagnostic tests for detection and or  diagnosis of SARS CoV 2 under Section 564(b)(1) of the Act, 21 U.S.C.  section [phone] 3(b)(1), unless the authorization is terminated or   revoked sooner. [facility] is certified under the  Clinical Laboratory Improvement Amendments of 1988 (CLIA), 42 U.S.C.  section [phone], to  perform high complexity tests. Performed at [facility] [id number] [address], [address], [place], [place] [address] Laboratory Director: Loleta Books, MD Fact Sheet for Healthcare Providers  BankingDealers.co.za Fact Sheet for Patients  StrictlyIdeas.no    Coronavirus Source NASOPHARYNGEAL  Final    Comment: Performed at [facility], [address] [place], [place] [address]  Culture, Urine     Status: None   Collection Time: 05/04/18  5:15 AM  Result Value Ref Range Status   Specimen Description URINE, CATHETERIZED  Final   Special Requests NONE  Final   Culture   Final    NO GROWTH Performed at [facility], [address], [place],  [address]    Report Status 05/05/2018 FINAL  Final    Labs: BNP (last 3 results) No results for input(s): BNP in the last 8760 hours. Basic Metabolic Panel: Recent Labs  Lab 04/30/18 0619 05/01/18 0443 05/02/18 0630 05/03/18 1623 05/04/18 0309 05/05/18 0529 05/06/18 0339  NA 136 137 137 136 137 136 133*  K 3.3* 3.6 4.1 3.8 4.2 4.2 4.1  CL 100 102 103 102 104 102 100  CO2 21* 22 25 21* 23 25 22   GLUCOSE 95 107* 94 106* 83 93 136*  BUN 97* 91* 84* 79* 78* 78* 74*  CREATININE 9.27* 8.34* 7.39* 7.31* 6.89* 6.78* 6.70*  CALCIUM 6.1* 6.1* 6.7* 7.0* 7.1* 7.4* 7.4*  PHOS 4.4 3.6 3.9  --   --   --   --    Liver Function Tests: Recent Labs  Lab 05/01/18 0443 05/02/18 0630 05/03/18 1623 05/05/18 0529 05/06/18 0339  AST  --   --  20 15 15   ALT  --   --  16 14 13   ALKPHOS  --   --  39 37* 36*  BILITOT  --   --  0.2* 0.4 0.3  PROT  --   --  8.6* 7.5 7.4  ALBUMIN 2.0* 2.0* 2.4* 2.1* 2.0*   No results for input(s): LIPASE, AMYLASE in the last 168 hours. Recent Labs  Lab 05/03/18 1926  AMMONIA 23   CBC: Recent Labs  Lab 05/01/18 0443 05/03/18 1623 05/04/18 0309 05/05/18 0529 05/06/18 0339  WBC 7.5 12.5* 8.3 7.2 6.3   NEUTROABS  --  9.3* 5.0 4.4 3.7  HGB 8.0* 8.8* 8.2* 7.8* 7.5*  HCT 25.0* 29.1* 26.1* 25.1* 24.2*  MCV 85.3 89.3 88.8 90.0 89.6  PLT 296 298 274 271 279   Cardiac Enzymes: Recent Labs  Lab 05/03/18 1623 05/03/18 2149  CKTOTAL 112  --   CKMB 1.6  --   TROPONINI  --  <0.03   BNP: Invalid input(s): POCBNP CBG: Recent Labs  Lab 05/05/18 1619 05/05/18 2138 05/06/18 0841 05/06/18 1203 05/06/18 1625  GLUCAP 139* 126* 91 138* 134*   D-Dimer No results for input(s): DDIMER in the last 72 hours. Hgb A1c Recent Labs    05/04/18 0309  HGBA1C 6.0*   Lipid Profile No results for input(s): CHOL, HDL, LDLCALC, TRIG, CHOLHDL, LDLDIRECT in the last 72 hours. Thyroid function studies Recent Labs    05/04/18 1116  TSH 2.440   Anemia work up Recent Labs    05/03/18 2149 05/04/18 1116  VITAMINB12  --  505  FERRITIN 539*  --    Urinalysis    Component Value Date/Time   COLORURINE YELLOW 05/03/2018 1715   APPEARANCEUR HAZY (A) 05/03/2018 1715   LABSPEC 1.011 05/03/2018 1715   PHURINE 6.0 05/03/2018 1715   GLUCOSEU NEGATIVE 05/03/2018 1715   HGBUR LARGE (A) 05/03/2018 1715   BILIRUBINUR NEGATIVE 05/03/2018 1715   BILIRUBINUR negative 04/26/2018 1049   KETONESUR NEGATIVE 05/03/2018 1715   PROTEINUR 100 (A) 05/03/2018 1715   UROBILINOGEN 0.2 04/26/2018 1049   NITRITE NEGATIVE 05/03/2018 1715   LEUKOCYTESUR LARGE (A) 05/03/2018 1715   Sepsis Labs Invalid input(s): PROCALCITONIN,  WBC,  LACTICIDVEN Microbiology Recent Results (from the past 240 hour(s))  Urine culture     Status: None   Collection Time: 05/03/18  5:15 PM  Result Value Ref Range Status   Specimen Description URINE, CATHETERIZED  Final   Special Requests NONE  Final   Culture   Final    NO GROWTH Performed at [facility], [place] [address] [place], [place] [address]    Report Status 05/04/2018 FINAL  Final  Novel Coronavirus, NAA (hospital order; send-out to ref lab)     Status: None    Collection Time: 05/03/18 10:13 PM  Result Value Ref Range Status   SARS-CoV-2, NAA NOT DETECTED NOT DETECTED Final    Comment:  Negative (Not Detected) results do not exclude infection caused by SARS CoV 2 and should not be used as the sole basis for treatment or other patient management decisions. Optimum specimen types and timing for peak viral levels during infections caused  by SARS CoV 2 have not been determined. Collection of multiple specimens (types and time points) from the same patient may be necessary to detect the virus. Improper specimen collection and handling, sequence variability underlying assay primers and or probes, or the presence of organisms in  quantities less than the limit of detection of the assay may lead to false negative results. Positive and negative predictive values of testing are highly dependent on prevalence. False negative results are more likely when prevalence of disease is high. (NOTE) The expected result is Negative (Not Detected). The SARS CoV 2 test is intended for the presumptive qualitative  detection of nucleic acid from SARS CoV 2 in upper and lower  respir atory specimens. Testing methodology is real time RT PCR. Test results must be correlated with clinical presentation and  evaluated in the context of other laboratory and epidemiologic data.  Test performance can be affected because the epidemiology and  clinical spectrum of infection caused by SARS CoV 2 is not fully  known. For example, the optimum types of specimens to collect and  when during the course of infection these specimens are most likely  to contain detectable viral RNA may not be known. This test has not been Food and Drug Administration (FDA) cleared or  approved and has been authorized by FDA under an Emergency Use  Authorization (EUA). The test is only authorized for the duration of  the declaration that circumstances exist justifying the authorization  of emergency use of in  vitro diagnostic tests for detection and or  diagnosis of SARS CoV 2 under Section 564(b)(1) of the Act, 21 U.S.C.  section [phone] 3(b)(1), unless the authorization is terminated or   revoked sooner. [facility] is certified under the  Clinical Laboratory Improvement Amendments of 1988 (CLIA), 42 U.S.C.  section [phone], to perform high complexity tests. Performed at [facility] [id number] [address], [place], [place] [address] Laboratory Director: Loleta Books, MD Fact Sheet for Healthcare Providers  BankingDealers.co.za Fact Sheet for Patients  StrictlyIdeas.no    Coronavirus Source NASOPHARYNGEAL  Final    Comment: Performed at [facility], [address] [place], [place] [address]  Culture, Urine     Status: None   Collection Time: 05/04/18  5:15 AM  Result Value Ref Range Status   Specimen Description URINE, CATHETERIZED  Final   Special Requests NONE  Final   Culture   Final    NO GROWTH Performed at [facility], [address], [place], [place] [address]    Report Status 05/05/2018 FINAL  Final   Time coordinating discharge: 35 minutes  SIGNED:  Kerney Elbe, DO [facility] 05/06/2018, 5:14 PM Pager is on [place]  If 7PM-7AM, please contact night-coverage [email or web address] Password [email or web address]

## 2018-05-06 NOTE — Progress Notes (Signed)
  Echocardiogram 2D Echocardiogram has been performed.  Matthew [name] 05/06/2018, 4:37 PM

## 2018-05-11 DIAGNOSIS — R339 Retention of urine, unspecified: Secondary | ICD-10-CM | POA: Diagnosis not present

## 2018-06-08 DIAGNOSIS — R338 Other retention of urine: Secondary | ICD-10-CM | POA: Diagnosis not present

## 2018-07-06 DIAGNOSIS — R339 Retention of urine, unspecified: Secondary | ICD-10-CM | POA: Diagnosis not present

## 2018-07-29 DIAGNOSIS — R339 Retention of urine, unspecified: Secondary | ICD-10-CM | POA: Diagnosis not present

## 2018-08-03 DIAGNOSIS — N138 Other obstructive and reflux uropathy: Secondary | ICD-10-CM | POA: Diagnosis not present

## 2018-08-03 DIAGNOSIS — N401 Enlarged prostate with lower urinary tract symptoms: Secondary | ICD-10-CM | POA: Diagnosis not present

## 2018-08-03 DIAGNOSIS — R339 Retention of urine, unspecified: Secondary | ICD-10-CM | POA: Diagnosis not present

## 2018-08-16 DIAGNOSIS — R338 Other retention of urine: Secondary | ICD-10-CM | POA: Diagnosis not present

## 2018-08-24 DIAGNOSIS — R338 Other retention of urine: Secondary | ICD-10-CM | POA: Diagnosis not present

## 2018-08-24 DIAGNOSIS — N138 Other obstructive and reflux uropathy: Secondary | ICD-10-CM | POA: Diagnosis not present

## 2018-08-24 DIAGNOSIS — N401 Enlarged prostate with lower urinary tract symptoms: Secondary | ICD-10-CM | POA: Diagnosis not present

## 2018-08-24 DIAGNOSIS — R972 Elevated prostate specific antigen [PSA]: Secondary | ICD-10-CM | POA: Diagnosis not present

## 2018-09-28 DIAGNOSIS — N138 Other obstructive and reflux uropathy: Secondary | ICD-10-CM | POA: Diagnosis not present

## 2018-09-28 DIAGNOSIS — N186 End stage renal disease: Secondary | ICD-10-CM | POA: Diagnosis not present

## 2018-09-28 DIAGNOSIS — Z992 Dependence on renal dialysis: Secondary | ICD-10-CM | POA: Diagnosis not present

## 2018-09-29 ENCOUNTER — Telehealth: Payer: Self-pay | Admitting: Family Medicine

## 2018-09-29 NOTE — Telephone Encounter (Signed)
Patient called and believes a neurology referral is needed  Showing symptoms of early Alzheimers   Would like a call back [place] (daughter)

## 2018-09-29 NOTE — Telephone Encounter (Signed)
Called and spoke to daughter and have scheduled pt an appointment for 10/11/2018

## 2018-10-11 ENCOUNTER — Other Ambulatory Visit: Payer: Self-pay

## 2018-10-11 ENCOUNTER — Encounter: Payer: Self-pay | Admitting: Family Medicine

## 2018-10-11 ENCOUNTER — Ambulatory Visit (INDEPENDENT_AMBULATORY_CARE_PROVIDER_SITE_OTHER): Payer: Medicare Other | Admitting: Family Medicine

## 2018-10-11 VITALS — BP 149/76 | HR 72 | Temp 98.3°F | Resp 12 | Wt 176.2 lb

## 2018-10-11 DIAGNOSIS — Z23 Encounter for immunization: Secondary | ICD-10-CM | POA: Diagnosis not present

## 2018-10-11 DIAGNOSIS — R413 Other amnesia: Secondary | ICD-10-CM | POA: Diagnosis not present

## 2018-10-11 DIAGNOSIS — F1011 Alcohol abuse, in remission: Secondary | ICD-10-CM | POA: Diagnosis not present

## 2018-10-11 DIAGNOSIS — R443 Hallucinations, unspecified: Secondary | ICD-10-CM | POA: Diagnosis not present

## 2018-10-11 DIAGNOSIS — D631 Anemia in chronic kidney disease: Secondary | ICD-10-CM

## 2018-10-11 DIAGNOSIS — N185 Chronic kidney disease, stage 5: Secondary | ICD-10-CM | POA: Diagnosis not present

## 2018-10-11 NOTE — Patient Instructions (Addendum)
  I will check labs, but also refer you to nephrology and psychiatry as we discussed.  Recheck with me in 1 month.   Return to the clinic or go to the nearest emergency room if any of your symptoms worsen or new symptoms occur.    If you have lab work done today you will be contacted with your lab results within the next 2 weeks.  If you have not heard from [place] then please contact us. The fastest way to get your results is to register for My Chart.   IF you received an x-ray today, you will receive an invoice from [facility]. Please contact [facility] at [phone] with questions or concerns regarding your invoice.   IF you received labwork today, you will receive an invoice from [facility]. Please contact [facility] at [phone] with questions or concerns regarding your invoice.   Our billing staff will not be able to assist you with questions regarding bills from these companies.  You will be contacted with the lab results as soon as they are available. The fastest way to get your results is to activate your My Chart account. Instructions are located on the last page of this paperwork. If you have not heard from [place] regarding the results in 2 weeks, please contact this office.

## 2018-10-11 NOTE — Progress Notes (Signed)
Subjective:    Patient ID: Matthew [name], male    DOB: 1950-03-22, 68 y.o.   MRN: [id number]  HPI Matthew [name] is a 68 y.o. male Presents today for: Chief Complaint  Patient presents with  . Hospitalization Follow-up    Patient was seen in the hospital in April. [name] patient care and daughter states patient has been showing early simptoms os alzheimer and would like a referral to Neuro. Mini cog=3 PHQ9=6   History of chronic kidney disease, acute on chronic renal failure with hospitalization March 30 through April 5. History of urinary retention at that time was performing self-catheterization. had progressive confusion, weakness, falling. Treated for acute UTI, Foley catheter, treated with 2 units packed red blood cells for symptomatic anemia.    Follow-up with urology and nephrology planned.  Also recommended decreased alcohol use.  Admitted again April 6 through April 9 for syncope.  Possible orthostatic cause.  CT head/C-spine is negative for acute abnormalities.  EF 60 to 65% on transthoracic echo.  Was continued on previous treatment for urinary tract infection.  PT OT consult without recommended follow-up.  Noted confusion in the ER with reports of hallucinations, 10 fasciculations, hand asterixis.  Mild atrophy on head CT but no acute findings.  Ammonia level was normal, TSH normal, B12 515.  Given thiamine with history of alcohol abuse.  Plan for outpatient EEG.  Thought to have chronic weakness from deconditioning.  Although he has not followed up with me since his hospitalizations, he has followed up with urology, appears last visit was July 28.  Previous urodynamics indicated good bladder function, possible bladder outlet procedure discussed.  Decided to continue continued intermittent catheterization.  42-month follow-up plan.   Last visit with nephrology - no outpatient follow up.  No follow up after hospital except urology.  Lab Results  Component Value Date   CREATININE  6.70 (H) 05/06/2018  denies recent change in fatigue. Feels better today than in March.  No history of hemodialysis.  Alcohol abuse:  Beer - reports slowing down. Reports last beer yesterday - 3 per week.  dtr [name] here, reports he has sneaked alcohol at times.   Memory difficulty.  Per dtr [name],  Forgetting to turn out lights. Leaves thing on stove.  Living with dtr.  Trouble with mind working for months. Does hear voices at times, tactile hallucinations at times. Past month. Occasional visual hallucination Denies prior diagnosis of schizophrenia.  Denies SI/HI. Occasional commands form voices - able to ignore them if needed.   Depression screen [facility] 2/9 10/11/2018 04/26/2018 04/26/2018 03/30/2017 03/20/2017  Decreased Interest 1 3 0 0 0  Down, Depressed, Hopeless 1 1 0 0 0  PHQ - 2 Score 2 4 0 0 0  Altered sleeping 1 1 - - -  Tired, decreased energy 1 3 - - -  Change in appetite 1 0 - - -  Feeling bad or failure about yourself  1 1 - - -  Trouble concentrating 0 1 - - -  Moving slowly or fidgety/restless 0 1 - - -  Suicidal thoughts 0 0 - - -  PHQ-9 Score 6 11 - - -  Difficult doing work/chores - Somewhat difficult - - -    Mini-Cog - 10/11/18 1012    Normal clock drawing test?  yes    How many words correct?  1       Patient Active Problem List   Diagnosis Date Noted  . Acute metabolic encephalopathy [id number]  .  Suspected Covid-19 Virus Infection 05/04/2018  . Fall 05/03/2018  . Syncope 05/03/2018  . Acute uremia 04/27/2018  . AKI (acute kidney injury) ([place]) 04/27/2018  . Acute on chronic renal failure ([place]) 04/26/2018  . Alcohol abuse 04/26/2018  . Normocytic anemia 04/26/2018  . Metabolic acidosis [id number]  . Acute lower UTI 04/26/2018  . Chronic renal failure, stage 4 (severe) ([place]) 08/01/2017  . Anemia, chronic renal failure, stage 4 (severe) ([place]) 08/01/2017  . DOE (dyspnea on exertion) 07/31/2017   Past Medical History:  Diagnosis Date  . Diabetes  ([place])   . ESRD (end stage renal disease) ([place]) 04/26/2018  . Essential hypertension    Past Surgical History:  Procedure Laterality Date  . finger lanced     No Known Allergies Prior to Admission medications   Medication Sig Start Date End Date Taking? Authorizing Provider  calcitRIOL (ROCALTROL) 0.5 MCG capsule Take 1 capsule (0.5 mcg total) by mouth daily. 05/03/18   Caren Griffins, MD  folic acid (FOLVITE) 1 MG tablet Take 1 tablet (1 mg total) by mouth daily. 05/07/18   Raiford Noble Latif, DO  Multiple Vitamin (MULTIVITAMIN WITH MINERALS) TABS tablet Take 1 tablet by mouth daily. 05/07/18   Raiford Noble Latif, DO  sodium bicarbonate 650 MG tablet Take 1 tablet (650 mg total) by mouth 2 (two) times daily. 05/02/18   Caren Griffins, MD  thiamine 100 MG tablet Take 1 tablet (100 mg total) by mouth daily. 05/07/18   Kerney Elbe, DO   Social History   Socioeconomic History  . Marital status: Single    Spouse name: Not on file  . Number of children: Not on file  . Years of education: Not on file  . Highest education level: Not on file  Occupational History  . Occupation: dietician  Social Needs  . Financial resource strain: Not on file  . Food insecurity    Worry: Not on file    Inability: Not on file  . Transportation needs    Medical: Not on file    Non-medical: Not on file  Tobacco Use  . Smoking status: Former Smoker    Packs/day: 0.25    Years: 1.00    Pack years: 0.25    Types: Cigarettes    Quit date: 01/27/1997    Years since quitting: 21.7  . Smokeless tobacco: Never Used  Substance and Sexual Activity  . Alcohol use: Yes    Alcohol/week: 10.0 - 12.0 standard drinks    Types: 10 - 12 Cans of beer per week    Comment: "I mean I drinks, but not like that", denies every day  . Drug use: No  . Sexual activity: Yes  Lifestyle  . Physical activity    Days per week: Not on file    Minutes per session: Not on file  . Stress: Not on file  Relationships  .  Social Herbalist on phone: Not on file    Gets together: Not on file    Attends religious service: Not on file    Active member of club or organization: Not on file    Attends meetings of clubs or organizations: Not on file    Relationship status: Not on file  . Intimate partner violence    Fear of current or ex partner: Not on file    Emotionally abused: Not on file    Physically abused: Not on file    Forced sexual activity: Not on file  Other Topics Concern  . Not on file  Social History Narrative  . Not on file    Review of Systems As above.  No new HA.  No N/V.  No abdominal pain No fever.  Eating and drinking ok.     Objective:   Physical Exam Vitals signs reviewed.  Constitutional:      Appearance: He is well-developed.  HENT:     Head: Normocephalic and atraumatic.  Eyes:     Pupils: Pupils are equal, round, and reactive to light.  Neck:     Vascular: No carotid bruit or JVD.  Cardiovascular:     Rate and Rhythm: Normal rate and regular rhythm.     Heart sounds: Normal heart sounds. No murmur.  Pulmonary:     Effort: Pulmonary effort is normal.     Breath sounds: Normal breath sounds. No rales.  Skin:    General: Skin is warm and dry.  Neurological:     Mental Status: He is alert and oriented to person, place, and time.  Psychiatric:        Attention and Perception: Attention normal.        Mood and Affect: Mood and affect normal.        Speech: Speech normal.        Behavior: Behavior normal. Behavior is cooperative.        Judgment: Judgment normal.     Comments: Does not appear to be responding to internal stimuli.     Vitals:   10/11/18 1009 10/11/18 1019  BP: (!) 153/78 (!) 149/76  Pulse: 72   Resp: 12   Temp: 98.3 F (36.8 C)   TempSrc: Oral   SpO2: 99%   Weight: 176 lb 3.2 oz (79.9 kg)   full bladder at this time.      Assessment & Plan:  Omran Nohr is a 68 y.o. male Memory difficulties - Plan: Ammonia, Ambulatory  referral to Psychiatry, CANCELED: Ambulatory referral to Psychiatry  Need for prophylactic vaccination against Streptococcus pneumoniae (pneumococcus) - Plan: Pneumococcal conjugate vaccine 13-valent  Need for prophylactic vaccination and inoculation against influenza - Plan: Flu Vaccine QUAD High Dose(Fluad)  Stage 5 chronic kidney disease not on chronic dialysis ([place]) - Plan: Basic metabolic panel, Ambulatory referral to Nephrology, CANCELED: Ambulatory referral to Nephrology  Hallucinations - Plan: Ammonia, Ambulatory referral to Psychiatry, CANCELED: Ambulatory referral to Psychiatry  History of alcohol abuse - Plan: Ammonia, Ambulatory referral to Psychiatry, CANCELED: Ambulatory referral to Psychiatry  Anemia due to stage 5 chronic kidney disease, not on chronic dialysis [facility]) - Plan: CBC  Initial concern from patient/family of memory changes, but unfortunately does have history of chronic kidney disease, status post hospitalization in March and April without follow-up.  Initial plan of BMP, CBC with outpatient work-up as he was feeling well in office, no sign of fluid overload, blood pressure slightly elevated, not low.  Nontoxic appearance.   Unfortunately creatinine has significantly increased, low bicarb, low calcium, and worsened anemia, thought to be due to chronic kidney disease.  Mental status changes likely due to uremia.  Due to significant worsening of anemia and renal function, will have evaluated through emergency room.  I did discuss plan briefly with nephrology.    I tried to call patient on the phone the morning of September 15, but his number is not working.  I called his emergency contact Elroy Channel, and left message regarding need for ER evaluation and to call us to review further details.  No orders of the defined types were placed in this encounter.  Patient Instructions    I will check labs, but also refer you to nephrology and psychiatry as we discussed.   Recheck with me in 1 month.   Return to the clinic or go to the nearest emergency room if any of your symptoms worsen or new symptoms occur.    If you have lab work done today you will be contacted with your lab results within the next 2 weeks.  If you have not heard from [place] then please contact us. The fastest way to get your results is to register for My Chart.   IF you received an x-ray today, you will receive an invoice from [facility]. Please contact [facility] Radiology at [phone] with questions or concerns regarding your invoice.   IF you received labwork today, you will receive an invoice from [facility]. Please contact [facility] at [phone] with questions or concerns regarding your invoice.   Our billing staff will not be able to assist you with questions regarding bills from these companies.  You will be contacted with the lab results as soon as they are available. The fastest way to get your results is to activate your My Chart account. Instructions are located on the last page of this paperwork. If you have not heard from [place] regarding the results in 2 weeks, please contact this office.       Signed,   Merri Ray, MD [facility].  10/12/18 12:39 PM

## 2018-10-12 ENCOUNTER — Inpatient Hospital Stay (HOSPITAL_COMMUNITY)
Admission: EM | Admit: 2018-10-12 | Discharge: 2018-10-19 | DRG: 674 | Disposition: A | Discharge status: Home Health | Payer: Medicare Other | Attending: Internal Medicine | Admitting: Internal Medicine

## 2018-10-12 ENCOUNTER — Telehealth: Payer: Self-pay

## 2018-10-12 ENCOUNTER — Inpatient Hospital Stay (HOSPITAL_COMMUNITY): Payer: Medicare Other

## 2018-10-12 ENCOUNTER — Other Ambulatory Visit: Payer: Self-pay

## 2018-10-12 ENCOUNTER — Encounter: Payer: Self-pay | Admitting: Family Medicine

## 2018-10-12 DIAGNOSIS — E1122 Type 2 diabetes mellitus with diabetic chronic kidney disease: Secondary | ICD-10-CM | POA: Diagnosis present

## 2018-10-12 DIAGNOSIS — D631 Anemia in chronic kidney disease: Secondary | ICD-10-CM | POA: Diagnosis present

## 2018-10-12 DIAGNOSIS — N390 Urinary tract infection, site not specified: Secondary | ICD-10-CM | POA: Diagnosis present

## 2018-10-12 DIAGNOSIS — E872 Acidosis, unspecified: Secondary | ICD-10-CM | POA: Diagnosis present

## 2018-10-12 DIAGNOSIS — N186 End stage renal disease: Secondary | ICD-10-CM | POA: Diagnosis present

## 2018-10-12 DIAGNOSIS — F17200 Nicotine dependence, unspecified, uncomplicated: Secondary | ICD-10-CM | POA: Diagnosis present

## 2018-10-12 DIAGNOSIS — N185 Chronic kidney disease, stage 5: Secondary | ICD-10-CM | POA: Diagnosis not present

## 2018-10-12 DIAGNOSIS — Z992 Dependence on renal dialysis: Secondary | ICD-10-CM | POA: Diagnosis not present

## 2018-10-12 DIAGNOSIS — B957 Other staphylococcus as the cause of diseases classified elsewhere: Secondary | ICD-10-CM | POA: Diagnosis present

## 2018-10-12 DIAGNOSIS — N136 Pyonephrosis: Secondary | ICD-10-CM | POA: Diagnosis present

## 2018-10-12 DIAGNOSIS — Z7982 Long term (current) use of aspirin: Secondary | ICD-10-CM | POA: Diagnosis not present

## 2018-10-12 DIAGNOSIS — F1010 Alcohol abuse, uncomplicated: Secondary | ICD-10-CM | POA: Diagnosis present

## 2018-10-12 DIAGNOSIS — Z1159 Encounter for screening for other viral diseases: Secondary | ICD-10-CM | POA: Diagnosis not present

## 2018-10-12 DIAGNOSIS — I120 Hypertensive chronic kidney disease with stage 5 chronic kidney disease or end stage renal disease: Secondary | ICD-10-CM | POA: Diagnosis not present

## 2018-10-12 DIAGNOSIS — N179 Acute kidney failure, unspecified: Principal | ICD-10-CM | POA: Diagnosis present

## 2018-10-12 DIAGNOSIS — I517 Cardiomegaly: Secondary | ICD-10-CM | POA: Diagnosis not present

## 2018-10-12 DIAGNOSIS — Z419 Encounter for procedure for purposes other than remedying health state, unspecified: Secondary | ICD-10-CM

## 2018-10-12 DIAGNOSIS — N319 Neuromuscular dysfunction of bladder, unspecified: Secondary | ICD-10-CM | POA: Diagnosis present

## 2018-10-12 DIAGNOSIS — D649 Anemia, unspecified: Secondary | ICD-10-CM | POA: Diagnosis present

## 2018-10-12 DIAGNOSIS — N250 Renal osteodystrophy: Secondary | ICD-10-CM | POA: Diagnosis present

## 2018-10-12 DIAGNOSIS — E871 Hypo-osmolality and hyponatremia: Secondary | ICD-10-CM | POA: Diagnosis present

## 2018-10-12 DIAGNOSIS — Z9119 Patient's noncompliance with other medical treatment and regimen: Secondary | ICD-10-CM | POA: Diagnosis not present

## 2018-10-12 DIAGNOSIS — Z79899 Other long term (current) drug therapy: Secondary | ICD-10-CM

## 2018-10-12 DIAGNOSIS — N401 Enlarged prostate with lower urinary tract symptoms: Secondary | ICD-10-CM | POA: Diagnosis not present

## 2018-10-12 DIAGNOSIS — Z95828 Presence of other vascular implants and grafts: Secondary | ICD-10-CM

## 2018-10-12 DIAGNOSIS — Z20828 Contact with and (suspected) exposure to other viral communicable diseases: Secondary | ICD-10-CM | POA: Diagnosis present

## 2018-10-12 DIAGNOSIS — I160 Hypertensive urgency: Secondary | ICD-10-CM | POA: Diagnosis present

## 2018-10-12 DIAGNOSIS — I10 Essential (primary) hypertension: Secondary | ICD-10-CM | POA: Diagnosis not present

## 2018-10-12 DIAGNOSIS — Z452 Encounter for adjustment and management of vascular access device: Secondary | ICD-10-CM | POA: Diagnosis not present

## 2018-10-12 DIAGNOSIS — Z03818 Encounter for observation for suspected exposure to other biological agents ruled out: Secondary | ICD-10-CM | POA: Diagnosis not present

## 2018-10-12 DIAGNOSIS — N184 Chronic kidney disease, stage 4 (severe): Secondary | ICD-10-CM | POA: Diagnosis not present

## 2018-10-12 LAB — BASIC METABOLIC PANEL
Anion gap: 17 — ABNORMAL HIGH (ref 5–15)
BUN/Creatinine Ratio: 9 — ABNORMAL LOW (ref 10–24)
BUN: 103 mg/dL (ref 8–27)
BUN: 113 mg/dL — ABNORMAL HIGH (ref 8–23)
CO2: 14 mmol/L — CL (ref 20–29)
CO2: 12 mmol/L — ABNORMAL LOW (ref 22–32)
Calcium: 6 mg/dL — CL (ref 8.6–10.2)
Calcium: 5.9 mg/dL — CL (ref 8.9–10.3)
Chloride: 105 mmol/L (ref 96–106)
Chloride: 104 mmol/L (ref 98–111)
Creatinine, Ser: 12.04 mg/dL (ref 0.76–1.27)
Creatinine, Ser: 13.19 mg/dL — ABNORMAL HIGH (ref 0.61–1.24)
GFR calc Af Amer: 4 mL/min/{1.73_m2} — ABNORMAL LOW (ref >59)
GFR calc Af Amer: 4 mL/min — ABNORMAL LOW (ref >60)
GFR calc non Af Amer: 4 mL/min/{1.73_m2} — ABNORMAL LOW (ref >59)
GFR calc non Af Amer: 3 mL/min — ABNORMAL LOW (ref >60)
Glucose, Bld: 135 mg/dL — ABNORMAL HIGH (ref 70–99)
Glucose: 79 mg/dL (ref 65–99)
Potassium: 4.9 mmol/L (ref 3.5–5.2)
Potassium: 5 mmol/L (ref 3.5–5.1)
Sodium: 138 mmol/L (ref 134–144)
Sodium: 133 mmol/L — ABNORMAL LOW (ref 135–145)

## 2018-10-12 LAB — URINALYSIS, ROUTINE W REFLEX MICROSCOPIC
Bilirubin Urine: NEGATIVE
Bilirubin Urine: NEGATIVE
Glucose, UA: NEGATIVE mg/dL
Glucose, UA: NEGATIVE mg/dL
Ketones, ur: NEGATIVE mg/dL
Ketones, ur: NEGATIVE mg/dL
Nitrite: NEGATIVE
Nitrite: NEGATIVE
Protein, ur: 30 mg/dL — AB
Protein, ur: 100 mg/dL — AB
RBC / HPF: >50 RBC/hpf — ABNORMAL HIGH (ref 0–5)
Specific Gravity, Urine: 1.005 (ref 1.005–1.030)
Specific Gravity, Urine: 1.005 (ref 1.005–1.030)
WBC, UA: >50 WBC/hpf — ABNORMAL HIGH (ref 0–5)
pH: 6 (ref 5.0–8.0)
pH: 6 (ref 5.0–8.0)

## 2018-10-12 LAB — CREATININE, URINE, RANDOM: Creatinine, Urine: 44.5 mg/dL

## 2018-10-12 LAB — CBC
HCT: 22 % — ABNORMAL LOW (ref 39.0–52.0)
Hematocrit: 20.3 % — ABNORMAL LOW (ref 37.5–51.0)
Hemoglobin: 6.3 g/dL — CL (ref 13.0–17.7)
Hemoglobin: 6.9 g/dL — CL (ref 13.0–17.0)
MCH: 27.8 pg (ref 26.6–33.0)
MCH: 30.3 pg (ref 26.0–34.0)
MCHC: 31 g/dL — ABNORMAL LOW (ref 31.5–35.7)
MCHC: 31.4 g/dL (ref 30.0–36.0)
MCV: 89 fL (ref 79–97)
MCV: 96.5 fL (ref 80.0–100.0)
Platelets: 292 10*3/uL (ref 150–450)
Platelets: 308 10*3/uL (ref 150–400)
RBC: 2.27 x10E6/uL — CL (ref 4.14–5.80)
RBC: 2.28 MIL/uL — ABNORMAL LOW (ref 4.22–5.81)
RDW: 14.1 % (ref 11.6–15.4)
RDW: 14.6 % (ref 11.5–15.5)
WBC: 7 10*3/uL (ref 3.4–10.8)
WBC: 6.3 10*3/uL (ref 4.0–10.5)
nRBC: 0 % (ref 0.0–0.2)

## 2018-10-12 LAB — AMMONIA: Ammonia: 67 ug/dL (ref 40–200)

## 2018-10-12 LAB — RETICULOCYTES
Immature Retic Fract: 17.9 % — ABNORMAL HIGH (ref 2.3–15.9)
RBC.: 2.27 MIL/uL — ABNORMAL LOW (ref 4.22–5.81)
Retic Count, Absolute: 58.6 10*3/uL (ref 19.0–186.0)
Retic Ct Pct: 2.6 % (ref 0.4–3.1)

## 2018-10-12 LAB — PREPARE RBC (CROSSMATCH)

## 2018-10-12 LAB — SARS CORONAVIRUS 2 (TAT 6-24 HRS): SARS Coronavirus 2: NEGATIVE

## 2018-10-12 LAB — CBG MONITORING, ED: Glucose-Capillary: 104 mg/dL — ABNORMAL HIGH (ref 70–99)

## 2018-10-12 LAB — SODIUM, URINE, RANDOM: Sodium, Ur: 69 mmol/L

## 2018-10-12 MED ORDER — SODIUM BICARBONATE 8.4 % IV SOLN
50.0000 meq | Freq: Once | INTRAVENOUS | Status: AC
  Administered 2018-10-12: 18:00:00 50 meq via INTRAVENOUS

## 2018-10-12 MED ORDER — CALCIUM GLUCONATE-NACL 1-0.675 GM/50ML-% IV SOLN
1.0000 g | Freq: Once | INTRAVENOUS | Status: AC
  Administered 2018-10-12: 1000 mg via INTRAVENOUS
  Filled 2018-10-12: qty 50

## 2018-10-12 MED ORDER — MORPHINE SULFATE (PF) 2 MG/ML IV SOLN: 0.5000 mg | INTRAVENOUS | Status: DC | PRN

## 2018-10-12 MED ORDER — SODIUM CHLORIDE 0.9% FLUSH: 3.0000 mL | Freq: Once | INTRAVENOUS | Status: DC

## 2018-10-12 MED ORDER — SODIUM CHLORIDE 0.9 % IV BOLUS
1000.0000 mL | Freq: Once | INTRAVENOUS | Status: AC
  Administered 2018-10-12: 1000 mL via INTRAVENOUS

## 2018-10-12 MED ORDER — CALCITRIOL 0.5 MCG PO CAPS
0.5000 ug | ORAL_CAPSULE | Freq: Every day | ORAL | Status: DC
  Administered 2018-10-12 – 2018-10-19 (×8): 0.5 ug via ORAL
  Filled 2018-10-12 (×8): qty 1

## 2018-10-12 MED ORDER — SODIUM CHLORIDE 0.9 % IV SOLN
10.0000 mL/h | Freq: Once | INTRAVENOUS | Status: AC
  Administered 2018-10-12: 10 mL/h via INTRAVENOUS

## 2018-10-12 MED ORDER — ADULT MULTIVITAMIN W/MINERALS CH
1.0000 | ORAL_TABLET | Freq: Every day | ORAL | Status: DC
  Administered 2018-10-12 – 2018-10-19 (×8): 1 via ORAL
  Filled 2018-10-12 (×8): qty 1

## 2018-10-12 MED ORDER — ONDANSETRON HCL 4 MG/2ML IJ SOLN: 4.0000 mg | Freq: Four times a day (QID) | INTRAMUSCULAR | Status: DC | PRN

## 2018-10-12 MED ORDER — LORAZEPAM 1 MG PO TABS
1.0000 mg | ORAL_TABLET | ORAL | Status: AC | PRN
  Administered 2018-10-13: 1 mg via ORAL
  Filled 2018-10-12: qty 1

## 2018-10-12 MED ORDER — STERILE WATER FOR INJECTION IV SOLN
INTRAVENOUS | Status: DC
  Administered 2018-10-12 – 2018-10-13 (×3): via INTRAVENOUS
  Filled 2018-10-12 (×5): qty 850

## 2018-10-12 MED ORDER — FOLIC ACID 1 MG PO TABS
1.0000 mg | ORAL_TABLET | Freq: Every day | ORAL | Status: DC
  Administered 2018-10-13 – 2018-10-19 (×7): 1 mg via ORAL
  Filled 2018-10-12 (×7): qty 1

## 2018-10-12 MED ORDER — SODIUM CHLORIDE 0.9 % IV SOLN
1.0000 g | INTRAVENOUS | Status: DC
  Administered 2018-10-13 – 2018-10-14 (×2): 1 g via INTRAVENOUS
  Filled 2018-10-12 (×2): qty 10
  Filled 2018-10-12: qty 1

## 2018-10-12 MED ORDER — HYDRALAZINE HCL 20 MG/ML IJ SOLN: 5.0000 mg | INTRAMUSCULAR | Status: DC | PRN

## 2018-10-12 MED ORDER — LORAZEPAM 2 MG/ML IJ SOLN: 1.0000 mg | INTRAMUSCULAR | Status: AC | PRN

## 2018-10-12 MED ORDER — ACETAMINOPHEN 325 MG PO TABS: 650.0000 mg | ORAL_TABLET | ORAL | Status: DC | PRN

## 2018-10-12 MED ORDER — HEPARIN SODIUM (PORCINE) 5000 UNIT/ML IJ SOLN
5000.0000 [IU] | Freq: Three times a day (TID) | INTRAMUSCULAR | Status: DC
  Administered 2018-10-12 – 2018-10-19 (×18): 5000 [IU] via SUBCUTANEOUS
  Filled 2018-10-12 (×18): qty 1

## 2018-10-12 MED ORDER — NICOTINE 14 MG/24HR TD PT24
14.0000 mg | MEDICATED_PATCH | Freq: Every day | TRANSDERMAL | Status: DC
  Administered 2018-10-14 – 2018-10-19 (×5): 14 mg via TRANSDERMAL
  Filled 2018-10-12 (×8): qty 1

## 2018-10-12 MED ORDER — SODIUM BICARBONATE 8.4 % IV SOLN
50.0000 meq | Freq: Once | INTRAVENOUS | Status: DC
  Filled 2018-10-12: qty 50

## 2018-10-12 MED ORDER — VITAMIN B-1 100 MG PO TABS
100.0000 mg | ORAL_TABLET | Freq: Every day | ORAL | Status: DC
  Administered 2018-10-13 – 2018-10-19 (×7): 100 mg via ORAL
  Filled 2018-10-12 (×7): qty 1

## 2018-10-12 NOTE — H&P (Signed)
History and Physical    Matthew [name] [id number] DOB: 1950/07/02 DOA: 10/12/2018  PCP: Wendie Agreste, MD  Patient coming from: home  I have personally briefly reviewed patient's old medical records in [place]  Chief Complaint: was asked to come to ED for abnormal labs by his PCP.   HPI: Matthew [name] is a 68 y.o. male with medical history significant of stage 5 CKD, hypertension, type 2 DM, Bilateral hydronephrosis, neurogenic bladder, anemia of chronic disease was seen by PCP recently and underwent blood work. He was asked to come to ED for evaluation of abnormal renal parameters. He was admitted multiple times earlier this year for AKI and UTI. He was also asked to follow up with urology on discharge in 04/2018 for bilateral hydronephrosis.   Pt reports with trouble with urination, associated with abdominal discomfort. He denies any dysuria. He denies any nausea, vomiting. He reports having diarrhea two days ago and now its resolved. He denies any fevers or chills, shortness of breath and chest pain, and cough. He denies headache, dizziness or tingling or  numbness of the extremities.   He denies any back pain. He denies any rectal bleeding or melena. He reports his last alcohol use is 2 days ago. Pt reports he continues to smoke.     ED Course: on arrival to ED, he was afebrile, tachypnic, hypertensive, with good sats on RA, labs revealed sodium of 133, potassium of 5, bicarb is 12, BUN of 113, creatinine of 13, calcium of 5.9, AG of 17, hemoglobin of 6.9, . UA  Showed large leukocytes, turbid appearance, with large hemoglobin with rare bacteria, . Urine sodium of 69, urine creatinine is 44, covid 19 screening test is pending. Urine culture is pending.    He was referred to medical service for evaluation of AKI and renal consulted for recommendations.   Review of Systems: As per HPI otherwise  "All others reviewed and are negative," .  Past Medical History:  Diagnosis  Date  . Diabetes ([place])   . ESRD (end stage renal disease) ([place]) 04/26/2018  . Essential hypertension     Past Surgical History:  Procedure Laterality Date  . finger lanced     Social history:   reports that he quit smoking about 21 years ago. His smoking use included cigarettes. He has a 0.25 pack-year smoking history. He has never used smokeless tobacco. He reports current alcohol use of about 10.0 - 12.0 standard drinks of alcohol per week. He reports that he does not use drugs.  No Known Allergies  Family History  Problem Relation Age of Onset  . Hypertension Mother   . Migraines Mother   . Heart disease Mother     Family history reviewed and not pertinent   Prior to Admission medications   Medication Sig Start Date End Date Taking? Authorizing Provider  calcitRIOL (ROCALTROL) 0.5 MCG capsule Take 1 capsule (0.5 mcg total) by mouth daily. 05/03/18   Caren Griffins, MD  folic acid (FOLVITE) 1 MG tablet Take 1 tablet (1 mg total) by mouth daily. 05/07/18   Raiford Noble Latif, DO  Multiple Vitamin (MULTIVITAMIN WITH MINERALS) TABS tablet Take 1 tablet by mouth daily. 05/07/18   Raiford Noble Latif, DO  sodium bicarbonate 650 MG tablet Take 1 tablet (650 mg total) by mouth 2 (two) times daily. 05/02/18   Caren Griffins, MD  thiamine 100 MG tablet Take 1 tablet (100 mg total) by mouth daily. 05/07/18   Raiford Noble  Latif, DO    Physical Exam: Vitals:   10/12/18 1419 10/12/18 1630  BP: (!) 153/72 (!) 163/90  Pulse: 70 71  Resp: 16 20  Temp: 97.8 F (36.6 C)   TempSrc: Oral   SpO2: 100% 100%    Constitutional: in mild to mod distress from abdominal discomfort.  Eyes: PERRL, pale conjunctive.  ENMT: Mucous membranes are dry.  Neck: normal, supple, no masses, no thyromegaly Respiratory: tachypnea, air entry diminished at bases, no wheezing or rhonchi.   Cardiovascular: Regular rate and rhythm, no murmurs /  Abdomen: abd is distended, mild discomfort in the lower abd ,  bowel sounds heard,  Musculoskeletal: no clubbing / cyanosis.  Skin: no rashes, lesions, ulcers. No induration Neurologic: CN 2-12 grossly intact. Sensation intact, DTR normal. Strength 5/5 in all 4.  Psychiatric: Normal judgment and insight. Alert and oriented x 3. Normal mood.  Labs on Admission: I have personally reviewed following labs and imaging studies  CBC: Recent Labs  Lab 10/11/18 1132 10/12/18 1440  WBC 7.0 6.3  HGB 6.3* 6.9*  HCT 20.3* 22.0*  MCV 89 96.5  PLT 292 [id number]   Basic Metabolic Panel: Recent Labs  Lab 10/11/18 1132 10/12/18 1440  NA 138 133*  K 4.9 5.0  CL 105 104  CO2 14* 12*  GLUCOSE 79 135*  BUN 103* 113*  CREATININE 12.04* 13.19*  CALCIUM 6.0* 5.9*   GFR: Estimated Creatinine Clearance: 5.6 mL/min (A) (by C-G formula based on SCr of 13.19 mg/dL (H)). Liver Function Tests: No results for input(s): AST, ALT, ALKPHOS, BILITOT, PROT, ALBUMIN in the last 168 hours. No results for input(s): LIPASE, AMYLASE in the last 168 hours. Recent Labs  Lab 10/11/18 1132  AMMONIA 67   Coagulation Profile: No results for input(s): INR, PROTIME in the last 168 hours. Cardiac Enzymes: No results for input(s): CKTOTAL, CKMB, CKMBINDEX, TROPONINI in the last 168 hours. BNP (last 3 results) No results for input(s): PROBNP in the last 8760 hours. HbA1C: No results for input(s): HGBA1C in the last 72 hours. CBG: Recent Labs  Lab 10/12/18 1631  GLUCAP 104*   Lipid Profile: No results for input(s): CHOL, HDL, LDLCALC, TRIG, CHOLHDL, LDLDIRECT in the last 72 hours. Thyroid Function Tests: No results for input(s): TSH, T4TOTAL, FREET4, T3FREE, THYROIDAB in the last 72 hours. Anemia Panel: No results for input(s): VITAMINB12, FOLATE, FERRITIN, TIBC, IRON, RETICCTPCT in the last 72 hours. Urine analysis:    Component Value Date/Time   COLORURINE YELLOW 05/03/2018 1715   APPEARANCEUR HAZY (A) 05/03/2018 1715   LABSPEC 1.011 05/03/2018 1715   PHURINE 6.0  05/03/2018 1715   GLUCOSEU NEGATIVE 05/03/2018 1715   HGBUR LARGE (A) 05/03/2018 1715   BILIRUBINUR NEGATIVE 05/03/2018 1715   BILIRUBINUR negative 04/26/2018 1049   KETONESUR NEGATIVE 05/03/2018 1715   PROTEINUR 100 (A) 05/03/2018 1715   UROBILINOGEN 0.2 04/26/2018 1049   NITRITE NEGATIVE 05/03/2018 1715   LEUKOCYTESUR LARGE (A) 05/03/2018 1715    Radiological Exams on Admission: No results found.  EKG: Independently reviewed. Sinus rhythm.   Assessment/Plan Active Problems:   AKI (acute kidney injury) ([place])    AKI ON stage 5 CKD with AG metabolic acidosis  Differential include progression of diabetic nephropathy vs obstructive uropathy vs dehydration.  Admit for further evaluation.  Check CT renal study, urine electrolytes, UA and urine cultures.  Foley placed and 1 lit of UO .  Please get urology input if CT renal study shows stones.  Hydrate with IV bicarbonate infusion,  and repeat renal parameters in am.    Anemia of chronic disease, anemia from stage 5 CKD;  Transfuse to keep hemoglobin greater than 7.  Hemoglobin at 6. 9 on admission. 1 unit of prbc transfusion ordered , repeat H&H in am.  Anemia panel sent.  Stool for occult blood ordered.     Abnormal UA with urinary retention Rocephin added and follow urine cultures.    Hypertensive urgency:  Prn hydralazine iV added. Pt asmyptomatic.   Diabetes mellitus:  CBG (last 3)  Recent Labs    10/12/18 1631  GLUCAP 104*   Resume SSI. Get A1c.    Alcohol abuse.  Last alcohol intake was 2 days ago. Monitor for withdrawal. On CIWA.    Smoking  On nicotine patch.    Hypocalcemia:  1 gm of calcium gluconate ordered. Check phosphorus levels and PTH.    Hyponatremia: From AKI.  Monitor.   Severity of Illness: The appropriate patient status for this patient is INPATIENT. Inpatient status is judged to be reasonable and necessary in order to provide the required intensity of service to ensure the  patient's safety. The patient's presenting symptoms, physical exam findings, and initial radiographic and laboratory data in the context of their chronic comorbidities is felt to place them at high risk for further clinical deterioration. Furthermore, it is not anticipated that the patient will be medically stable for discharge from the hospital within 2 midnights of admission.   * I certify that at the point of admission it is my clinical judgment that the patient will require inpatient hospital care spanning beyond 2 midnights from the point of admission due to high intensity of service, high risk for further deterioration and high frequency of surveillance required.*  DVT prophylaxis:  sq heparin.  Code Status: full code  Family Communication: NONE at bedside.  Disposition Plan: pending clinical improvement/ further evaluation.  Consults called: nephrology  Admission status: inpatient. Telemetry.   Hosie Poisson MD [facility] Pager [phone]  If 7PM-7AM, please contact night-coverage [email or web address] Password [facility]  10/12/2018, 5:43 PM

## 2018-10-12 NOTE — ED Notes (Addendum)
514ml in bladder.

## 2018-10-12 NOTE — ED Notes (Signed)
Pts daughter has not come to the ED, the pt is placed in the lobby awaiting lab results, sort rn informed to update chart if the daughter comes in and will monitor the pt

## 2018-10-12 NOTE — Telephone Encounter (Signed)
Pt.'s emergency contact, Elroy Channel returned Dr. Vonna Kotyk call and was given his message. She verbalizes understanding and states she will get him to the ED this afternoon.

## 2018-10-12 NOTE — ED Notes (Signed)
Hgb 6.9 charge aware, will find a room

## 2018-10-12 NOTE — ED Provider Notes (Signed)
[facility] Emergency Department Provider Note MRN:  [id number]  Arrival date & time: 10/12/18     Chief Complaint   Abnormal lab History of Present Illness   Arkin Hemsworth is a 68 y.o. year-old male with a history of CKD, diabetes, hypertension presenting to the ED with chief complaint of abnormal lab.  History obtained from patient's daughter via telephone as patient is a poor historian.  Patient is physically without complaints.  May be more forgetful for the past several months.  Had a doctor's appointment yesterday with blood testing performed.  Called today and told to go to the emergency department for abnormal labs.  Told that his kidney numbers were worse.  Review of Systems  A complete 10 system review of systems was obtained and all systems are negative except as noted in the HPI and PMH.   Patient's Health History    Past Medical History:  Diagnosis Date  . Diabetes ([place])   . ESRD (end stage renal disease) ([place]) 04/26/2018  . Essential hypertension     Past Surgical History:  Procedure Laterality Date  . finger lanced      Family History  Problem Relation Age of Onset  . Hypertension Mother   . Migraines Mother   . Heart disease Mother     Social History   Socioeconomic History  . Marital status: Single    Spouse name: Not on file  . Number of children: Not on file  . Years of education: Not on file  . Highest education level: Not on file  Occupational History  . Occupation: dietician  Social Needs  . Financial resource strain: Not on file  . Food insecurity    Worry: Not on file    Inability: Not on file  . Transportation needs    Medical: Not on file    Non-medical: Not on file  Tobacco Use  . Smoking status: Former Smoker    Packs/day: 0.25    Years: 1.00    Pack years: 0.25    Types: Cigarettes    Quit date: 01/27/1997    Years since quitting: 21.7  . Smokeless tobacco: Never Used  Substance and Sexual Activity  .  Alcohol use: Yes    Alcohol/week: 10.0 - 12.0 standard drinks    Types: 10 - 12 Cans of beer per week    Comment: "I mean I drinks, but not like that", denies every day  . Drug use: No  . Sexual activity: Yes  Lifestyle  . Physical activity    Days per week: Not on file    Minutes per session: Not on file  . Stress: Not on file  Relationships  . Social Herbalist on phone: Not on file    Gets together: Not on file    Attends religious service: Not on file    Active member of club or organization: Not on file    Attends meetings of clubs or organizations: Not on file    Relationship status: Not on file  . Intimate partner violence    Fear of current or ex partner: Not on file    Emotionally abused: Not on file    Physically abused: Not on file    Forced sexual activity: Not on file  Other Topics Concern  . Not on file  Social History Narrative  . Not on file     Physical Exam  Vital Signs and Nursing Notes reviewed Vitals:   10/12/18  1419 10/12/18 1630  BP: (!) 153/72 (!) 163/90  Pulse: 70 71  Resp: 16 20  Temp: 97.8 F (36.6 C)   SpO2: 100% 100%    CONSTITUTIONAL: Chronically ill-appearing, NAD NEURO:  Alert and oriented x 3, no focal deficits EYES:  eyes equal and reactive ENT/NECK:  no LAD, no JVD CARDIO: Regular rate, well-perfused, normal S1 and S2 PULM:  CTAB no wheezing or rhonchi GI/GU:  normal bowel sounds, non-distended, non-tender MSK/SPINE:  No gross deformities, no edema SKIN:  no rash, atraumatic PSYCH:  Appropriate speech and behavior  Diagnostic and Interventional Summary    EKG Interpretation  Date/Time:  Tuesday October 12 2018 16:30:10 EDT Ventricular Rate:  71 PR Interval:    QRS Duration: 90 QT Interval:  434 QTC Calculation: 472 R Axis:   57 Text Interpretation:  Sinus rhythm Confirmed by Gerlene Fee [phone]) on 10/12/2018 5:32:47 PM      Labs Reviewed  BASIC METABOLIC PANEL - Abnormal; Notable for the following  components:      Result Value   Sodium 133 (*)    CO2 12 (*)    Glucose, Bld 135 (*)    BUN 113 (*)    Creatinine, Ser 13.19 (*)    Calcium 5.9 (*)    GFR calc non Af Amer 3 (*)    GFR calc Af Amer 4 (*)    Anion gap 17 (*)    All other components within normal limits  CBC - Abnormal; Notable for the following components:   RBC 2.28 (*)    Hemoglobin 6.9 (*)    HCT 22.0 (*)    All other components within normal limits  CBG MONITORING, ED - Abnormal; Notable for the following components:   Glucose-Capillary 104 (*)    All other components within normal limits  URINE CULTURE  SARS CORONAVIRUS 2 (TAT 6-24 HRS)  URINALYSIS, ROUTINE W REFLEX MICROSCOPIC  SODIUM, URINE, RANDOM  CREATININE, URINE, RANDOM  TYPE AND SCREEN  PREPARE RBC (CROSSMATCH)    No orders to display    Medications  sodium chloride flush (NS) 0.9 % injection 3 mL (has no administration in time range)  sodium chloride 0.9 % bolus 1,000 mL (has no administration in time range)  calcium gluconate 1 g/ 50 mL sodium chloride IVPB (has no administration in time range)  0.9 %  sodium chloride infusion (has no administration in time range)  sodium bicarbonate injection 50 mEq (has no administration in time range)     Procedures Critical Care Critical Care Documentation Critical care time provided by me (excluding procedures): 33 minutes  Condition necessitating critical care: Acute renal failure  Components of critical care management: reviewing of prior records, laboratory and imaging interpretation, frequent re-examination and reassessment of vital signs, administration of IV fluids, IV calcium, IV bicarb, discussion with consulting services.    ED Course and Medical Decision Making  I have reviewed the triage vital signs and the nursing notes.  Pertinent labs & imaging results that were available during my care of the patient were reviewed by me and considered in my medical decision making (see below for  details).  Largely symptomatic acute renal failure in this 68 year old male with history of late stage CKD, had a prior hospitalization with a similar presentation in the setting of UTI.  Patient is with a hemoglobin of 6.9, will likely need transfusion.  To be admitted to hospital service.  Nephrology consulted for recommendations.   Barth Kirks. Sedonia Small, [place]  Emergency [place] [email or web address]  Final Clinical Impressions(s) / ED Diagnoses     ICD-10-CM   1. Acute renal failure, unspecified acute renal failure type ([place])  N17.9   2. Symptomatic anemia  D64.9     ED Discharge Orders    None      Discharge Instructions Discussed with and Provided to Patient: Discharge Instructions   None       Maudie Flakes, MD 10/12/18 1733

## 2018-10-12 NOTE — ED Notes (Signed)
This RN called Aravind Taiwo the pts daughter on file and asked for the number of her sister who is here in the parking lot, she did not provide it but said she would call and tell her to come into the ED so that we could talk about what is going on with the pt

## 2018-10-12 NOTE — ED Triage Notes (Signed)
Pt in unable to state why he is here, this RN reviewed the notes and labs and see where the pts creatinine, BUN, and hgb are abnormal, ordering a redraw and attempting to call the pts daughter who is reported to be in the car per the pt, protocol orders to be placed

## 2018-10-12 NOTE — Consult Note (Addendum)
[facility] Nephrology Consultation Note  Requesting MD: Dr Karleen Hampshire, V Reason for consult: CKD  HPI:  Matthew [name] is a 68 y.o. male with history of hypertension, diabetes, neurogenic bladder requiring self-catheterization, bilateral hydronephrosis, CKD 5, symptomatic anemia, sent to the ER by PCP for abnormal lab, seen in consultation at the request of ER physician and Dr. Karleen Hampshire for worsening kidney function associated with anemia and acidosis. He is seen in ER.  Patient was admitted from 4/6-05/06/2018 for fall associated with altered mental status.  He was found to have UTI, creatinine level 7.3 on admission.  He was treated with antibiotics and IV fluid with improvement in creatinine level to 6.7 on 4/9/120.  The kidney ultrasound with creasing bilateral hydronephrosis.  He was admitted to [facility] from 3/30- 4/5 for AKI on CKD due to obstructive uropathy, urinary retention and UTI.  The creatinine level was elevated to 14 with bilateral hydronephrosis.  He was treated with antibiotics and Foley catheter with improvement of creatinine level to 7.39 on discharge.  Patient was seen at [facility] on 09/03/2017 by Dr. Johnney Ou.  He was found to have bilateral hydronephrosis and was referred to urologist.  Since then patient has missed multiple clinic appointments.  Today, patient reported that he came to ER as per his PCPs direction for abnormal lab results.  He denies nausea, vomiting, chest pain, shortness of breath.  As per patient he was seen by urologist but he was not sure about when and what was done.  He was unsure if he was doing catheterization at home.  Patient denied flank or back pain.  No dysuria, urgency or frequency.  In the ER, blood pressure 153/72, and room air.  The labs showed creatinine level 13.19, CO2 12, potassium 5, BUN 113, calcium 5.9, hemoglobin 6.9.  I spoke with the ER physician and recommended Foley catheter insertion.  The urinary bag already has more than  1000 cc of urine.  Patient received 1 g of calcium, 1 L of normal saline and 50 mEq sodium bicarbonate.  He is admitted for further evaluation of anemia and worsening renal failure.   PMHx:   Past Medical History:  Diagnosis Date  . Diabetes ([place])   . ESRD (end stage renal disease) ([place]) 04/26/2018  . Essential hypertension     Past Surgical History:  Procedure Laterality Date  . finger lanced      Family Hx:  Family History  Problem Relation Age of Onset  . Hypertension Mother   . Migraines Mother   . Heart disease Mother     Social History:  reports that he quit smoking about 21 years ago. His smoking use included cigarettes. He has a 0.25 pack-year smoking history. He has never used smokeless tobacco. He reports current alcohol use of about 10.0 - 12.0 standard drinks of alcohol per week. He reports that he does not use drugs.  Allergies: No Known Allergies  Medications: Prior to Admission medications   Medication Sig Start Date End Date Taking? Authorizing Provider  calcitRIOL (ROCALTROL) 0.5 MCG capsule Take 1 capsule (0.5 mcg total) by mouth daily. 05/03/18   Caren Griffins, MD  folic acid (FOLVITE) 1 MG tablet Take 1 tablet (1 mg total) by mouth daily. 05/07/18   Raiford Noble Latif, DO  Multiple Vitamin (MULTIVITAMIN WITH MINERALS) TABS tablet Take 1 tablet by mouth daily. 05/07/18   Raiford Noble Latif, DO  sodium bicarbonate 650 MG tablet Take 1 tablet (650 mg total) by mouth  2 (two) times daily. 05/02/18   Caren Griffins, MD  thiamine 100 MG tablet Take 1 tablet (100 mg total) by mouth daily. 05/07/18   Raiford Noble Latif, DO    I have reviewed the patient's current medications.  Labs:  Results for orders placed or performed during the hospital encounter of 10/12/18 (from the past 48 hour(s))  Basic metabolic panel     Status: Abnormal   Collection Time: 10/12/18  2:40 PM  Result Value Ref Range   Sodium 133 (L) 135 - 145 mmol/L   Potassium 5.0 3.5 - 5.1  mmol/L   Chloride 104 98 - 111 mmol/L   CO2 12 (L) 22 - 32 mmol/L   Glucose, Bld 135 (H) 70 - 99 mg/dL   BUN 113 (H) 8 - 23 mg/dL   Creatinine, Ser 13.19 (H) 0.61 - 1.24 mg/dL   Calcium 5.9 (LL) 8.9 - 10.3 mg/dL    Comment: CRITICAL RESULT CALLED TO, READ BACK BY AND VERIFIED WITH:  S CRUZ,RN 1534 10/12/2018 WBOND    GFR calc non Af Amer 3 (L) >60 mL/min   GFR calc Af Amer 4 (L) >60 mL/min   Anion gap 17 (H) 5 - 15    Comment: Performed at [facility], [address], [place] [address]  CBC     Status: Abnormal   Collection Time: 10/12/18  2:40 PM  Result Value Ref Range   WBC 6.3 4.0 - 10.5 K/uL   RBC 2.28 (L) 4.22 - 5.81 MIL/uL   Hemoglobin 6.9 (LL) 13.0 - 17.0 g/dL    Comment: REPEATED TO VERIFY THIS CRITICAL RESULT HAS VERIFIED AND BEEN CALLED TO L.SHULAR RN BY IMANI MANNING ON 09 15 2020 AT [id number], AND HAS BEEN READ BACK.     HCT 22.0 (L) 39.0 - 52.0 %   MCV 96.5 80.0 - 100.0 fL   MCH 30.3 26.0 - 34.0 pg   MCHC 31.4 30.0 - 36.0 g/dL   RDW 14.6 11.5 - 15.5 %   Platelets 308 150 - 400 K/uL   nRBC 0.0 0.0 - 0.2 %    Comment: Performed at [facility], [place] [address], [place], [place] [address]  CBG monitoring, ED     Status: Abnormal   Collection Time: 10/12/18  4:31 PM  Result Value Ref Range   Glucose-Capillary 104 (H) 70 - 99 mg/dL  Type and screen [place]     Status: None (Preliminary result)   Collection Time: 10/12/18  4:45 PM  Result Value Ref Range   ABO/RH(D) A POS    Antibody Screen NEG    Sample Expiration      10/15/2018,2359 Performed at [facility], [place] [address], [place], [place] [address]    Unit Number [id number]    Blood Component Type RED CELLS,LR    Unit division 00    Status of Unit ALLOCATED    Transfusion Status OK TO TRANSFUSE    Crossmatch Result Compatible   Prepare RBC     Status: None   Collection Time: 10/12/18  4:45 PM  Result Value Ref Range   Order Confirmation      ORDER  PROCESSED BY BLOOD BANK Performed at [facility], [place] [address], [place], [place] [address]   Urinalysis, Routine w reflex microscopic     Status: Abnormal   Collection Time: 10/12/18  4:58 PM  Result Value Ref Range   Color, Urine YELLOW  YELLOW   APPearance TURBID (A) CLEAR   Specific Gravity, Urine 1.005 1.005 - 1.030   pH 6.0 5.0 - 8.0   Glucose, UA NEGATIVE NEGATIVE mg/dL   Hgb urine dipstick LARGE (A) NEGATIVE   Bilirubin Urine NEGATIVE NEGATIVE   Ketones, ur NEGATIVE NEGATIVE mg/dL   Protein, ur 30 (A) NEGATIVE mg/dL   Nitrite NEGATIVE NEGATIVE   Leukocytes,Ua LARGE (A) NEGATIVE   RBC / HPF 0-5 0 - 5 RBC/hpf   WBC, UA >50 (H) 0 - 5 WBC/hpf   Bacteria, UA RARE (A) NONE SEEN   WBC Clumps PRESENT     Comment: Performed at [facility] [address], [place], [place] [address]  Sodium, urine, random     Status: None   Collection Time: 10/12/18  4:58 PM  Result Value Ref Range   Sodium, Ur 69 mmol/L    Comment: Performed at [facility] [address], [place], [place] [address]  Creatinine, urine, random     Status: None   Collection Time: 10/12/18  4:58 PM  Result Value Ref Range   Creatinine, Urine 44.50 mg/dL    Comment: Performed at [facility] [address], [place], [place] [address]  Reticulocytes     Status: Abnormal   Collection Time: 10/12/18  5:30 PM  Result Value Ref Range   Retic Ct Pct 2.6 0.4 - 3.1 %   RBC. 2.27 (L) 4.22 - 5.81 MIL/uL   Retic Count, Absolute 58.6 19.0 - 186.0 K/uL   Immature Retic Fract 17.9 (H) 2.3 - 15.9 %    Comment: Performed at [facility] [address] [place],  [address]     ROS:  Pertinent items noted in HPI and remainder of comprehensive ROS otherwise negative.  Physical Exam: Vitals:   10/12/18 1715 10/12/18 1730  BP: (!) 149/85 (!) 150/85  Pulse: 64 70  Resp: 16 19  Temp:    SpO2: 99% 100%     General exam: Appears calm and comfortable  Respiratory system: Clear to  auscultation. Respiratory effort normal. No wheezing or crackle Cardiovascular system: S1 & S2 heard, RRR.  No pedal edema. Gastrointestinal system: Abdomen is distended, soft and nontender. Normal bowel sounds heard. Central nervous system: Alert and oriented. No focal neurological deficits. Extremities: Symmetric 5 x 5 power. Skin: No rashes, lesions or ulcers Psychiatry: Judgement and insight appear impaired. Mood & affect appropriate.   Assessment/Plan:  #AKI on CKD 5 due to obstructive uropathy: The creatinine level 13.19 associated with acidosis, anemia and hypocalcemia.  This is probably progressive CKD to ESRD.  Unsure if he was evaluated by urologist however, at this time I do not think he has much renal function to salvage. -Continue Foley catheter, already has more than 1 L of urine. -I will order CT scan of kidneys to better evaluate hydronephrosis. -Continue IV fluid -Check UA and urine culture -I have discussed with the patient that he will need dialysis if no significant improvement in renal function.Patient agreed with the plan.  He has no overt uremic symptoms and no indication for urgent dialysis tonight.    #Bilateral hydronephrosis/neurogenic bladder: Order CT scan of kidneys.  It would be helpful to review  the urology note as patient said he was seen by [facility] in the past.  Patient does not know detail about the visit and if any procedure done.   #Metabolic acidosis: Start sodium bicarbonate IV fluid.  #Anemia due to  CKD: Noted plan for blood transfusion.  Check iron studies.  He will need ESA pending iron studies.  #Hypocalcemia: Received a dose of calcium today.  Check phosphorus, PTH level.  Resume calcitriol.  #Hypertension: Monitor blood pressure.  May need antihypertensive if BP remains high.  He has a history of alcohol abuse and currently on watch for alcohol withdrawal.  Discussed with the primary team.  We will follow with you.  Sorina Derrig Tanna Furry 10/12/2018, 6:11 PM  [facility].

## 2018-10-13 ENCOUNTER — Encounter (HOSPITAL_COMMUNITY): Payer: Self-pay | Admitting: General Practice

## 2018-10-13 DIAGNOSIS — F1010 Alcohol abuse, uncomplicated: Secondary | ICD-10-CM

## 2018-10-13 DIAGNOSIS — N390 Urinary tract infection, site not specified: Secondary | ICD-10-CM

## 2018-10-13 DIAGNOSIS — N184 Chronic kidney disease, stage 4 (severe): Secondary | ICD-10-CM

## 2018-10-13 DIAGNOSIS — D631 Anemia in chronic kidney disease: Secondary | ICD-10-CM

## 2018-10-13 LAB — CBC
HCT: 23.9 % — ABNORMAL LOW (ref 39.0–52.0)
Hemoglobin: 7.6 g/dL — ABNORMAL LOW (ref 13.0–17.0)
MCH: 29.9 pg (ref 26.0–34.0)
MCHC: 31.8 g/dL (ref 30.0–36.0)
MCV: 94.1 fL (ref 80.0–100.0)
Platelets: 280 10*3/uL (ref 150–400)
RBC: 2.54 MIL/uL — ABNORMAL LOW (ref 4.22–5.81)
RDW: 14.2 % (ref 11.5–15.5)
WBC: 6.7 10*3/uL (ref 4.0–10.5)
nRBC: 0 % (ref 0.0–0.2)

## 2018-10-13 LAB — RENAL FUNCTION PANEL
Albumin: 2.8 g/dL — ABNORMAL LOW (ref 3.5–5.0)
Anion gap: 15 (ref 5–15)
BUN: 109 mg/dL — ABNORMAL HIGH (ref 8–23)
CO2: 19 mmol/L — ABNORMAL LOW (ref 22–32)
Calcium: 5.9 mg/dL — CL (ref 8.9–10.3)
Chloride: 106 mmol/L (ref 98–111)
Creatinine, Ser: 12.78 mg/dL — ABNORMAL HIGH (ref 0.61–1.24)
GFR calc Af Amer: 4 mL/min — ABNORMAL LOW (ref >60)
GFR calc non Af Amer: 4 mL/min — ABNORMAL LOW (ref >60)
Glucose, Bld: 87 mg/dL (ref 70–99)
Phosphorus: 7 mg/dL — ABNORMAL HIGH (ref 2.5–4.6)
Potassium: 4.5 mmol/L (ref 3.5–5.1)
Sodium: 140 mmol/L (ref 135–145)

## 2018-10-13 LAB — TYPE AND SCREEN
ABO/RH(D): A POS
Antibody Screen: NEGATIVE
Unit division: 0

## 2018-10-13 LAB — BASIC METABOLIC PANEL
Anion gap: 13 (ref 5–15)
BUN: 106 mg/dL — ABNORMAL HIGH (ref 8–23)
CO2: 20 mmol/L — ABNORMAL LOW (ref 22–32)
Calcium: 5.9 mg/dL — CL (ref 8.9–10.3)
Chloride: 107 mmol/L (ref 98–111)
Creatinine, Ser: 12.63 mg/dL — ABNORMAL HIGH (ref 0.61–1.24)
GFR calc Af Amer: 4 mL/min — ABNORMAL LOW (ref >60)
GFR calc non Af Amer: 4 mL/min — ABNORMAL LOW (ref >60)
Glucose, Bld: 88 mg/dL (ref 70–99)
Potassium: 4.5 mmol/L (ref 3.5–5.1)
Sodium: 140 mmol/L (ref 135–145)

## 2018-10-13 LAB — VITAMIN B12: Vitamin B-12: 327 pg/mL (ref 180–914)

## 2018-10-13 LAB — IRON AND TIBC
Iron: 60 ug/dL (ref 45–182)
Saturation Ratios: 30 % (ref 17.9–39.5)
TIBC: 203 ug/dL — ABNORMAL LOW (ref 250–450)
UIBC: 143 ug/dL

## 2018-10-13 LAB — FOLATE: Folate: 8.2 ng/mL (ref >5.9)

## 2018-10-13 LAB — HIV ANTIBODY (ROUTINE TESTING W REFLEX): HIV Screen 4th Generation wRfx: NONREACTIVE

## 2018-10-13 LAB — FERRITIN: Ferritin: 417 ng/mL — ABNORMAL HIGH (ref 24–336)

## 2018-10-13 LAB — BPAM RBC
Blood Product Expiration Date: 202010102359
ISSUE DATE / TIME: 202009151849
Unit Type and Rh: 6200

## 2018-10-13 LAB — MAGNESIUM: Magnesium: 1.9 mg/dL (ref 1.7–2.4)

## 2018-10-13 LAB — PHOSPHORUS: Phosphorus: 7.1 mg/dL — ABNORMAL HIGH (ref 2.5–4.6)

## 2018-10-13 MED ORDER — CALCIUM GLUCONATE-NACL 1-0.675 GM/50ML-% IV SOLN
1.0000 g | Freq: Once | INTRAVENOUS | Status: AC
  Administered 2018-10-13: 11:00:00 1000 mg via INTRAVENOUS
  Filled 2018-10-13: qty 50

## 2018-10-13 NOTE — Telephone Encounter (Signed)
Pt was evaluated at the ER.

## 2018-10-13 NOTE — Progress Notes (Signed)
   [facility] Note   Assessment/ Plan:   #AKI on CKD 5 due to obstructive uropathy: The creatinine level 13.19 associated with acidosis, anemia and hypocalcemia.  This is probably progressive CKD to ESRD.  Unsure if he was evaluated by urologist however, at this time I do not think he has much renal function to salvage. -Continue Foley catheter, already has more than 1 L of urine. -CT scan of kidneys to better evaluate hydronephrosis--> revealed chronic hydro, bladder decompressed by Foley.   -Continue IV fluid- on bicarb gtt -Check UA and urine culture -I have discussed with the patient that he will need dialysis if no significant improvement in renal function.Patient agreed with the plan.  He has no overt uremic symptoms and no indication for urgent dialysis.  He is hopeful that he won't have to start but I don't know how much improvement we will see.  If amenable will vein map.  #Bilateral hydronephrosis/neurogenic bladder: Order CT scan of kidneys.  It would be helpful to review  the urology note as patient said he was seen by [facility] in the past.  Patient does not know detail about the visit and if any procedure done.  He is supposed to CIC per his report but unclear how much he's doing.    #Metabolic acidosis: improved with bicarb, continue  #Anemia due to CKD: Noted plan for blood transfusion.  Check iron studies.  He will need ESA pending iron studies.  #Hypocalcemia: Replete with Ca gluc  Check phosphorus, PTH level--> pending  Resume calcitriol.  #Hypertension: Monitor blood pressure.  May need antihypertensive if BP remains high.  He has a history of alcohol abuse and currently on watch for alcohol withdrawal.  Subjective:    Sitting in bed.  Urine output robust from Foley.  Notes that he tries to go "on his own" most of the time and doesn't typically try to CIC.     Objective:   BP (!) 154/75   Pulse 71   Temp 97.9 F (36.6 C) (Oral)    Resp 15   SpO2 95%   Physical Exam: Gen: older gentleman, NAD CVS:RRR no m/r/g Resp: clear bilaterally Abd: NABS, nontender, can't feel bladder Ext: trace LE edema GU: Foley full of rosy colored urine  Labs: BMET Recent Labs  Lab 10/11/18 1132 10/12/18 1440 10/13/18 0422 10/13/18 0423  NA 138 133* 140 140  K 4.9 5.0 4.5 4.5  CL 105 104 107 106  CO2 14* 12* 20* 19*  GLUCOSE 79 135* 88 87  BUN 103* 113* 106* 109*  CREATININE 12.04* 13.19* 12.63* 12.78*  CALCIUM 6.0* 5.9* 5.9* 5.9*  PHOS  --   --  7.1* 7.0*   CBC Recent Labs  Lab 10/11/18 1132 10/12/18 1440 10/13/18 0422  WBC 7.0 6.3 6.7  HGB 6.3* 6.9* 7.6*  HCT 20.3* 22.0* 23.9*  MCV 89 96.5 94.1  PLT 292 308 280    @IMGRELPRIORS @ Medications:    . calcitRIOL  0.5 mcg Oral Daily  . folic acid  1 mg Oral Daily  . heparin  5,000 Units Subcutaneous Q8H  . multivitamin with minerals  1 tablet Oral Daily  . nicotine  14 mg Transdermal Daily  . sodium chloride flush  3 mL Intravenous Once  . thiamine  100 mg Oral Daily     Madelon Lips, MD [facility] pgr [phone] 10/13/2018, 10:13 AM

## 2018-10-13 NOTE — Progress Notes (Signed)
PROGRESS NOTE        PATIENT DETAILS Name: Matthew [name] Age: 68 y.o. Sex: male Date of Birth: Jul 08, 1950 Admit Date: 10/12/2018 Admitting Physician Hosie Poisson, MD [id number], Ranell Patrick, MD  Brief Narrative: Patient is a 68 y.o. male with history of CKD stage V secondary to obstructive uropathy, hypertension-sent to the ED by primary care practitioner for worsening renal failure-now thought to have likely ESRD.  See below for further details.  Subjective: Lying comfortably in bed-denies any chest pain or shortness of breath.  Assessment/Plan: AKI on CKD stage V-most likely has progressed to ESRD: Nephrology following-probably will need to be started on HD this admission.  Currently has no emergent indication for HD at this point.  History of chronic obstructive uropathy with acute urinary retention on admission: CT renal stone study-did not show any obvious hydronephrosis-Foley catheter in place (had 1 L of urine output when Foley was placed)  Complicated UTI: On empiric Rocephin-UA grossly abnormal-had urinary retention on admission-await culture data.  Anemia: Suspect secondary to CKD-no evidence of blood loss-patient is s/p 1 unit of PRBC on 9/15.  Continue to follow-we will require repeat transfusion if hemoglobin is less than 7.    Metabolic acidosis: Secondary to AKI-improved with bicarb supplementation  Hypocalcemia: Probably secondary to CKD-awaiting PTH levels-calcitriol resumed  HTN: Blood pressure controlled-we will start antihypertensives as needed  Alcohol abuse: Last drink was 2 days prior to this hospital stay-no signs of withdrawal at this point.  Continue with Ativan per protocol.   Diet: Diet Order            Diet renal with fluid restriction Fluid restriction: 1200 mL Fluid; Room service appropriate? Yes; Fluid consistency: Thin  Diet effective now               DVT Prophylaxis: Prophylactic Heparin   Code Status: Full  code   Family Communication: None at bedside  Disposition Plan: Remain inpatient  Barrier to discharge: Significantly worsened renal function with severe metabolic acidosis, urinary retention and UTI-probably needs initiation of HD during this hospital stay-if no improvement.  Antimicrobial agents: Anti-infectives (From admission, onward)   Start     Dose/Rate Route Frequency Ordered Stop   10/13/18 0000  cefTRIAXone (ROCEPHIN) 1 g in sodium chloride 0.9 % 100 mL IVPB     1 g 200 mL/hr over 30 Minutes Intravenous Every 24 hours 10/12/18 1926        Procedures: None  CONSULTS:  nephrology  Time spent: 25 minutes-Greater than 50% of this time was spent in counseling, explanation of diagnosis, planning of further management, and coordination of care.  MEDICATIONS: Scheduled Meds: . calcitRIOL  0.5 mcg Oral Daily  . folic acid  1 mg Oral Daily  . heparin  5,000 Units Subcutaneous Q8H  . multivitamin with minerals  1 tablet Oral Daily  . nicotine  14 mg Transdermal Daily  . sodium chloride flush  3 mL Intravenous Once  . thiamine  100 mg Oral Daily   Continuous Infusions: . cefTRIAXone (ROCEPHIN)  IV Stopped (10/13/18 0126)  .  sodium bicarbonate (isotonic) infusion in sterile water 100 mL/hr at 10/13/18 0513   PRN Meds:.acetaminophen, hydrALAZINE, LORazepam **OR** LORazepam, morphine injection, ondansetron (ZOFRAN) IV   PHYSICAL EXAM: Vital signs: Vitals:   10/13/18 0930 10/13/18 1000 10/13/18 1025 10/13/18 1037  BP: (!) 144/78 (!) 154/75  Marland Kitchen)  148/75  Pulse: 73 71 72 71  Resp: 15 15 18 17   Temp:      TempSrc:      SpO2: 99% 95% 100% 98%   There were no vitals filed for this visit. There is no height or weight on file to calculate BMI.   Gen Exam:Alert awake-not in any distress HEENT:atraumatic, normocephalic Chest: B/L clear to auscultation anteriorly CVS:S1S2 regular Abdomen:soft non tender, non distended Extremities:no edema Neurology: Non focal Skin:  no rash  I have personally reviewed following labs and imaging studies  LABORATORY DATA: CBC: Recent Labs  Lab 10/11/18 1132 10/12/18 1440 10/13/18 0422  WBC 7.0 6.3 6.7  HGB 6.3* 6.9* 7.6*  HCT 20.3* 22.0* 23.9*  MCV 89 96.5 94.1  PLT 292 308 [id number]    Basic Metabolic Panel: Recent Labs  Lab 10/11/18 1132 10/12/18 1440 10/13/18 0422 10/13/18 0423  NA 138 133* 140 140  K 4.9 5.0 4.5 4.5  CL 105 104 107 106  CO2 14* 12* 20* 19*  GLUCOSE 79 135* 88 87  BUN 103* 113* 106* 109*  CREATININE 12.04* 13.19* 12.63* 12.78*  CALCIUM 6.0* 5.9* 5.9* 5.9*  MG  --   --  1.9  --   PHOS  --   --  7.1* 7.0*    GFR: Estimated Creatinine Clearance: 5.8 mL/min (A) (by C-G formula based on SCr of 12.78 mg/dL (H)).  Liver Function Tests: Recent Labs  Lab 10/13/18 0423  ALBUMIN 2.8*   No results for input(s): LIPASE, AMYLASE in the last 168 hours. Recent Labs  Lab 10/11/18 1132  AMMONIA 67    Coagulation Profile: No results for input(s): INR, PROTIME in the last 168 hours.  Cardiac Enzymes: No results for input(s): CKTOTAL, CKMB, CKMBINDEX, TROPONINI in the last 168 hours.  BNP (last 3 results) No results for input(s): PROBNP in the last 8760 hours.  HbA1C: No results for input(s): HGBA1C in the last 72 hours.  CBG: Recent Labs  Lab 10/12/18 1631  GLUCAP 104*    Lipid Profile: No results for input(s): CHOL, HDL, LDLCALC, TRIG, CHOLHDL, LDLDIRECT in the last 72 hours.  Thyroid Function Tests: No results for input(s): TSH, T4TOTAL, FREET4, T3FREE, THYROIDAB in the last 72 hours.  Anemia Panel: Recent Labs    10/12/18 1730 10/13/18 0422  VITAMINB12  --  327  FOLATE  --  8.2  FERRITIN  --  417*  TIBC  --  203*  IRON  --  60  RETICCTPCT 2.6  --     Urine analysis:    Component Value Date/Time   COLORURINE YELLOW 10/12/2018 1917   APPEARANCEUR CLOUDY (A) 10/12/2018 1917   LABSPEC 1.005 10/12/2018 1917   PHURINE 6.0 10/12/2018 1917   GLUCOSEU NEGATIVE  10/12/2018 1917   HGBUR MODERATE (A) 10/12/2018 1917   BILIRUBINUR NEGATIVE 10/12/2018 1917   BILIRUBINUR negative 04/26/2018 1049   KETONESUR NEGATIVE 10/12/2018 1917   PROTEINUR 100 (A) 10/12/2018 1917   UROBILINOGEN 0.2 04/26/2018 1049   NITRITE NEGATIVE 10/12/2018 1917   LEUKOCYTESUR LARGE (A) 10/12/2018 1917    Sepsis Labs: Lactic Acid, Venous    Component Value Date/Time   LATICACIDVEN 1.1 05/03/2018 1623    MICROBIOLOGY: Recent Results (from the past 240 hour(s))  SARS CORONAVIRUS 2 (TAT 6-24 HRS) Nasopharyngeal Nasopharyngeal Swab     Status: None   Collection Time: 10/12/18  5:17 PM   Specimen: Nasopharyngeal Swab  Result Value Ref Range Status   SARS Coronavirus 2 NEGATIVE NEGATIVE Final  Comment: (NOTE) SARS-CoV-2 target nucleic acids are NOT DETECTED. The SARS-CoV-2 RNA is generally detectable in upper and lower respiratory specimens during the acute phase of infection. Negative results do not preclude SARS-CoV-2 infection, do not rule out co-infections with other pathogens, and should not be used as the sole basis for treatment or other patient management decisions. Negative results must be combined with clinical observations, patient history, and epidemiological information. The expected result is Negative. Fact Sheet for Patients: SugarRoll.be Fact Sheet for Healthcare Providers: https://www.woods-mathews.com/ This test is not yet approved or cleared by the [place] FDA and  has been authorized for detection and/or diagnosis of SARS-CoV-2 by FDA under an Emergency Use Authorization (EUA). This EUA will remain  in effect (meaning this test can be used) for the duration of the COVID-19 declaration under Section 56 4(b)(1) of the Act, 21 U.S.C. section 360bbb-3(b)(1), unless the authorization is terminated or revoked sooner. Performed at [facility], [place] [address] [place], [place] [address]      RADIOLOGY STUDIES/RESULTS: Ct Renal Stone Study  Result Date: 10/12/2018 CLINICAL DATA:  Previous history of hydronephrosis. EXAM: CT ABDOMEN AND PELVIS WITHOUT CONTRAST TECHNIQUE: Multidetector CT imaging of the abdomen and pelvis was performed following the standard protocol without IV contrast. COMPARISON:  Ultrasound 05/04/2018 FINDINGS: Lower chest: Mild patchy scarring or atelectasis at both lung bases. No pleural effusion. Hepatobiliary: Normal without contrast. Pancreas: Normal Spleen: Normal Adrenals/Urinary Tract: Adrenal glands are normal. There is bilateral hydronephrosis at the level of the kidneys and renal pelves. I cannot clearly identify dilated ureters. Catheter is present in the bladder. I do not see evidence of stone disease. Stomach/Bowel: Chronic diverticulosis of the colon without evidence of acute diverticulitis. No acute bowel pathology is seen. Vascular/Lymphatic: No aortic calcification or aortic aneurysm. IVC is normal. No retroperitoneal adenopathy. Reproductive: Enlarged prostate. Other: No free fluid or air. Musculoskeletal: Ordinary lumbar degenerative disc disease and degenerative facet disease. IMPRESSION: Chronic fullness of the renal collecting systems and renal pelves. I cannot clearly identify dilated ureters. No evidence of stone disease. Foley catheter in the bladder. Enlarged prostate. The findings could relate to chronic UPJ stenoses or could be sequela of chronic bladder outlet obstruction relieved by the Foley catheter. Electronically Signed   By: Nelson Chimes M.D.   On: 10/12/2018 20:53     LOS: 1 day   Oren Binet, MD  [facility]  If 7PM-7AM, please contact night-coverage  Please page via [email or web address]  Go to [email or web address] and use [place]'s universal password to access. If you do not have the password, please contact the hospital operator.  Locate the [facility] provider you are looking for under [facility] and page to a number that you can  be directly reached. If you still have difficulty reaching the provider, please page the [facility] (Director on Call) for the Hospitalists listed on amion for assistance.  10/13/2018, 12:04 PM

## 2018-10-14 DIAGNOSIS — N185 Chronic kidney disease, stage 5: Secondary | ICD-10-CM

## 2018-10-14 LAB — CBC
HCT: 21.5 % — ABNORMAL LOW (ref 39.0–52.0)
Hemoglobin: 7.3 g/dL — ABNORMAL LOW (ref 13.0–17.0)
MCH: 30.8 pg (ref 26.0–34.0)
MCHC: 34 g/dL (ref 30.0–36.0)
MCV: 90.7 fL (ref 80.0–100.0)
Platelets: 261 10*3/uL (ref 150–400)
RBC: 2.37 MIL/uL — ABNORMAL LOW (ref 4.22–5.81)
RDW: 14.3 % (ref 11.5–15.5)
WBC: 6.6 10*3/uL (ref 4.0–10.5)
nRBC: 0 % (ref 0.0–0.2)

## 2018-10-14 LAB — RENAL FUNCTION PANEL
Albumin: 2.6 g/dL — ABNORMAL LOW (ref 3.5–5.0)
Anion gap: 15 (ref 5–15)
BUN: 104 mg/dL — ABNORMAL HIGH (ref 8–23)
CO2: 26 mmol/L (ref 22–32)
Calcium: 5.7 mg/dL — CL (ref 8.9–10.3)
Chloride: 99 mmol/L (ref 98–111)
Creatinine, Ser: 11.82 mg/dL — ABNORMAL HIGH (ref 0.61–1.24)
GFR calc Af Amer: 5 mL/min — ABNORMAL LOW (ref >60)
GFR calc non Af Amer: 4 mL/min — ABNORMAL LOW (ref >60)
Glucose, Bld: 107 mg/dL — ABNORMAL HIGH (ref 70–99)
Phosphorus: 6.1 mg/dL — ABNORMAL HIGH (ref 2.5–4.6)
Potassium: 3.5 mmol/L (ref 3.5–5.1)
Sodium: 140 mmol/L (ref 135–145)

## 2018-10-14 LAB — URINE CULTURE: Culture: >=100000 — AB

## 2018-10-14 LAB — PTH, INTACT AND CALCIUM
Calcium, Total (PTH): 6.1 mg/dL — CL (ref 8.6–10.2)
PTH: 98 pg/mL — ABNORMAL HIGH (ref 15–65)

## 2018-10-14 LAB — OCCULT BLOOD X 1 CARD TO LAB, STOOL: Fecal Occult Bld: NEGATIVE

## 2018-10-14 LAB — HEMOGLOBIN A1C
Hgb A1c MFr Bld: <4.2 % — ABNORMAL LOW (ref 4.8–5.6)
Mean Plasma Glucose: <74 mg/dL

## 2018-10-14 MED ORDER — CALCIUM CARBONATE ANTACID 500 MG PO CHEW
2.0000 | CHEWABLE_TABLET | Freq: Three times a day (TID) | ORAL | Status: DC
  Administered 2018-10-14 – 2018-10-19 (×16): 400 mg via ORAL
  Filled 2018-10-14 (×16): qty 2

## 2018-10-14 MED ORDER — CEFAZOLIN SODIUM-DEXTROSE 1-4 GM/50ML-% IV SOLN
1.0000 g | INTRAVENOUS | Status: AC
  Administered 2018-10-14 – 2018-10-17 (×4): 1 g via INTRAVENOUS
  Administered 2018-10-18: 10:00:00 2 g via INTRAVENOUS
  Administered 2018-10-18: 1 g via INTRAVENOUS
  Filled 2018-10-14 (×5): qty 50

## 2018-10-14 MED ORDER — CALCIUM GLUCONATE-NACL 2-0.675 GM/100ML-% IV SOLN
2.0000 g | Freq: Once | INTRAVENOUS | Status: AC
  Administered 2018-10-14: 2000 mg via INTRAVENOUS
  Filled 2018-10-14: qty 100

## 2018-10-14 MED ORDER — DARBEPOETIN ALFA 100 MCG/0.5ML IJ SOSY
100.0000 ug | PREFILLED_SYRINGE | INTRAMUSCULAR | Status: DC
  Administered 2018-10-14: 100 ug via SUBCUTANEOUS
  Filled 2018-10-14: qty 0.5

## 2018-10-14 NOTE — Progress Notes (Signed)
  [facility] Progress Note   Assessment/ Plan:   #AKI on CKD 5 due to obstructive uropathy: The creatinine level 13.19 associated with acidosis, anemia and hypocalcemia.  This is probably progressive CKD to ESRD.  Unsure if he was evaluated by urologist however, at this time I do not think he has much renal function to salvage. -Continue Foley catheter.  Noncompliant with CIC  -CT scan of kidneys to better evaluate hydronephrosis--> revealed chronic hydro, bladder decompressed by Foley.   -Stop bicarb gtt -Check UA and urine culture-- on CTX now -ordered vein mapping, have c/s VVS for [facility] and perm access- appreciate assistance.  #Bilateral hydronephrosis/neurogenic bladder: He is supposed to CIC per his report but unclear how much he's doing.    #Metabolic acidosis: improved with bicarb, stop today  #Anemia due to CKD: Noted plan for blood transfusion.  Will start ESA  #Hypocalcemia: Replete with Ca gluc  Check phosphorus, PTH level--> pending  Resume calcitriol. TUMS 2 tablets TID.  #Hypertension: not on antihypertensives at present-- would favor amlodipine if needed to start  Subjective:    No real improvement in Cr with Foley.  He is likely ESRD.  Will need to start dialysis and had long discussion with pt.  Discussed that he would need to have permanent access placement- discussed need for tunneled HD catheter and fistula/ AVG to let it heal.  Willing to proceed   Objective:   BP 139/83 (BP Location: Left Arm)   Pulse 74   Temp 98 F (36.7 C) (Oral)   Resp 16   Ht 5' 10.5" (1.791 m)   Wt 79.8 kg   SpO2 98%   BMI 24.89 kg/m   Physical Exam: Gen: older gentleman, NAD CVS:RRR no m/r/g Resp: clear bilaterally Abd: NABS, nontender, can't feel bladder Ext: trace LE edema GU: Foley full of rosy colored urine  Labs: BMET Recent Labs  Lab 10/11/18 1132 10/12/18 1440 10/13/18 0422 10/13/18 0423 10/14/18 0409  NA 138 133* 140 140 140  K 4.9 5.0 4.5  4.5 3.5  CL 105 104 107 106 99  CO2 14* 12* 20* 19* 26  GLUCOSE 79 135* 88 87 107*  BUN 103* 113* 106* 109* 104*  CREATININE 12.04* 13.19* 12.63* 12.78* 11.82*  CALCIUM 6.0* 5.9* 5.9* 5.9* 5.7*  PHOS  --   --  7.1* 7.0* 6.1*   CBC Recent Labs  Lab 10/11/18 1132 10/12/18 1440 10/13/18 0422 10/14/18 0409  WBC 7.0 6.3 6.7 6.6  HGB 6.3* 6.9* 7.6* 7.3*  HCT 20.3* 22.0* 23.9* 21.5*  MCV 89 96.5 94.1 90.7  PLT 292 308 280 261    @IMGRELPRIORS @ Medications:    . calcitRIOL  0.5 mcg Oral Daily  . calcium carbonate  2 tablet Oral TID  . folic acid  1 mg Oral Daily  . heparin  5,000 Units Subcutaneous Q8H  . multivitamin with minerals  1 tablet Oral Daily  . nicotine  14 mg Transdermal Daily  . sodium chloride flush  3 mL Intravenous Once  . thiamine  100 mg Oral Daily     Madelon Lips, MD [facility] pgr [phone] 10/14/2018, 11:06 AM

## 2018-10-14 NOTE — Progress Notes (Signed)
CRITICAL VALUE ALERT  Critical Value:  Calcium 5.7  Date & Time Notied:  10/14/18 0608  Provider Notified: Tylene Fantasia x2 pages via Shea Evans  Orders Received/Actions taken: NO RETURN CALL, PASSED ON TO DAYSHIFT RN

## 2018-10-14 NOTE — Consult Note (Addendum)
Hospital Consult    Reason for Consult: Dialysis access Requesting Physician: Dr. Hollie Salk MRN #:  [id number]  History of Present Illness: This is a 68 y.o. male with past medical history significant for diabetes mellitus, hypertension, CKD now ESRD.  He is seen in consultation for evaluation for permanent dialysis access.  He is right arm dominant and would prefer access placement in left arm.  He does not have a pacemaker.  He is not taking any blood thinners.  Vein mapping is pending.  Past Medical History:  Diagnosis Date  . Diabetes ([place])   . ESRD (end stage renal disease) ([place]) 04/26/2018  . Essential hypertension     Past Surgical History:  Procedure Laterality Date  . finger lanced      No Known Allergies  Prior to Admission medications   Medication Sig Start Date End Date Taking? Authorizing Provider  aspirin 325 MG tablet Take 325 mg by mouth as needed for mild pain (thin blood).   Yes [provider]  calcitRIOL (ROCALTROL) 0.5 MCG capsule Take 1 capsule (0.5 mcg total) by mouth daily. 05/03/18  Yes Gherghe, Vella Redhead, MD  folic acid (FOLVITE) 1 MG tablet Take 1 tablet (1 mg total) by mouth daily. 05/07/18  Yes Sheikh, Omair Latif, DO  ibuprofen (ADVIL) 200 MG tablet Take 600 mg by mouth every 6 (six) hours as needed for headache or mild pain.   Yes [provider]  Multiple Vitamin (MULTIVITAMIN WITH MINERALS) TABS tablet Take 1 tablet by mouth daily. 05/07/18  Yes Sheikh, Omair Latif, DO  sodium bicarbonate 650 MG tablet Take 1 tablet (650 mg total) by mouth 2 (two) times daily. 05/02/18  Yes Caren Griffins, MD  thiamine 100 MG tablet Take 1 tablet (100 mg total) by mouth daily. 05/07/18  Yes Kerney Elbe, DO    Social History   Socioeconomic History  . Marital status: Single    Spouse name: Not on file  . Number of children: Not on file  . Years of education: Not on file  . Highest education level: Not on file  Occupational History  .  Occupation: dietician  Social Needs  . Financial resource strain: Not on file  . Food insecurity    Worry: Not on file    Inability: Not on file  . Transportation needs    Medical: Not on file    Non-medical: Not on file  Tobacco Use  . Smoking status: Former Smoker    Packs/day: 0.25    Years: 1.00    Pack years: 0.25    Types: Cigarettes    Quit date: 01/27/1997    Years since quitting: 21.7  . Smokeless tobacco: Never Used  Substance and Sexual Activity  . Alcohol use: Yes    Alcohol/week: 10.0 - 12.0 standard drinks    Types: 10 - 12 Cans of beer per week    Comment: "I mean I drinks, but not like that", denies every day  . Drug use: Yes    Types: Marijuana  . Sexual activity: Yes  Lifestyle  . Physical activity    Days per week: Not on file    Minutes per session: Not on file  . Stress: Not on file  Relationships  . Social Herbalist on phone: Not on file    Gets together: Not on file    Attends religious service: Not on file    Active member of club or organization: Not on file  Attends meetings of clubs or organizations: Not on file    Relationship status: Not on file  . Intimate partner violence    Fear of current or ex partner: Not on file    Emotionally abused: Not on file    Physically abused: Not on file    Forced sexual activity: Not on file  Other Topics Concern  . Not on file  Social History Narrative  . Not on file     Family History  Problem Relation Age of Onset  . Hypertension Mother   . Migraines Mother   . Heart disease Mother     ROS: Otherwise negative unless mentioned in HPI  Physical Examination  Vitals:   10/14/18 0524 10/14/18 0730  BP: (!) 141/82 139/83  Pulse: 69 74  Resp: 19 16  Temp: 98.1 F (36.7 C) 98 F (36.7 C)  SpO2: 99% 98%   Body mass index is 24.89 kg/m.  General:  WDWN in NAD Gait: Not observed HENT: WNL, normocephalic Pulmonary: normal non-labored breathing Cardiac: regular, without   Murmurs, rubs or gallops; Abdomen:  soft, NT/ND, no masses Skin: without rashes Vascular Exam/Pulses: symmetrical radial pulses Extremities: without ischemic changes, without Gangrene , without cellulitis; without open wounds;  Musculoskeletal: no muscle wasting or atrophy  Neurologic: A&O X 3;  No focal weakness or paresthesias are detected; speech is fluent/normal Psychiatric:  The pt has Normal affect. Lymph:  Unremarkable  CBC    Component Value Date/Time   WBC 6.6 10/14/2018 0409   RBC 2.37 (L) 10/14/2018 0409   HGB 7.3 (L) 10/14/2018 0409   HGB 6.3 (LL) 10/11/2018 1132   HCT 21.5 (L) 10/14/2018 0409   HCT 20.3 (L) 10/11/2018 1132   PLT 261 10/14/2018 0409   PLT 292 10/11/2018 1132   MCV 90.7 10/14/2018 0409   MCV 89 10/11/2018 1132   MCH 30.8 10/14/2018 0409   MCHC 34.0 10/14/2018 0409   RDW 14.3 10/14/2018 0409   RDW 14.1 10/11/2018 1132   LYMPHSABS 1.7 05/06/2018 0339   MONOABS 0.6 05/06/2018 0339   EOSABS 0.3 05/06/2018 0339   BASOSABS 0.0 05/06/2018 0339    BMET    Component Value Date/Time   NA 140 10/14/2018 0409   NA 138 10/11/2018 1132   K 3.5 10/14/2018 0409   CL 99 10/14/2018 0409   CO2 26 10/14/2018 0409   GLUCOSE 107 (H) 10/14/2018 0409   BUN 104 (H) 10/14/2018 0409   BUN 103 (HH) 10/11/2018 1132   CREATININE 11.82 (H) 10/14/2018 0409   CALCIUM 5.7 (LL) 10/14/2018 0409   GFRNONAA 4 (L) 10/14/2018 0409   GFRAA 5 (L) 10/14/2018 0409    COAGS: Lab Results  Component Value Date   INR 1.2 05/03/2018   INR 0.8 02/06/2007     Non-Invasive Vascular Imaging:   Vein mapping pending   ASSESSMENT/PLAN: This is a 68 y.o. male with end-stage renal disease and will require dialysis access  Vein mapping pending Right arm dominant; would prefer dialysis access in left arm Restrict left arm Plan for AV fistula creation versus graft placement pending vein mapping On-call vascular surgeon Dr. Donzetta Matters will evaluate the patient later today and provide  further treatment plans   Dagoberto Ligas PA-C [facility] [phone]  I have independently interviewed and examined patient and agree with PA assessment and plan above.  Vein mapping is pending but appears that he should have vein for access creation.  We will plan tunneled dialysis catheter and AV  fistula versus graft on Monday of next week.  If he needs access in the interim would need temporary or tunneled catheter with interventional radiology.  Will need to be n.p.o. past midnight Sunday.  Dorma Altman C. Donzetta Matters, MD [facility] [place] Office: [phone] Pager: [phone]

## 2018-10-14 NOTE — Progress Notes (Signed)
PROGRESS NOTE        PATIENT DETAILS Name: Matthew [name] Age: 68 y.o. Sex: male Date of Birth: 1950/02/02 Admit Date: 10/12/2018 Admitting Physician Hosie Poisson, MD [id number], Ranell Patrick, MD  Brief Narrative: Patient is a 68 y.o. male with history of CKD stage V secondary to obstructive uropathy, hypertension-sent to the ED by primary care practitioner for worsening renal failure-now thought to have likely ESRD.  See below for further details.  Subjective: No major issues overnight-chest pain or shortness of breath.  Claims his last drink was almost a week before hospitalization.  Assessment/Plan: AKI on CKD stage V-most likely has progressed to ESRD: Nephrology following-with plans to start hemodialysis this admission.  Electrolytes currently stable-no urgent indication for HD.  History of chronic obstructive uropathy with acute urinary retention on admission: CT renal stone study-did not show any obvious hydronephrosis-Foley catheter in place (had 1 L of urine output when Foley was placed)  Complicated UTI: UA grossly abnormal-had urinary retention admission as well-continue Rocephin-await culture data.    Anemia: Suspect secondary to CKD-no evidence of blood loss-patient is s/p 1 unit of PRBC on 9/15.  Continue to follow hemoglobin-if hemoglobin is less than 7 will require another transfusion.  Metabolic acidosis: Secondary to AKI-improved with bicarb supplementation  Hypocalcemia: Probably secondary to CKD-awaiting PTH levels-calcitriol resumed  HTN: Blood pressure controlled-we will start antihypertensives as needed  Alcohol abuse: She claimed last drink was 2 days prior to this hospital stay-now claims that is approximately a week back-in any event-no signs of withdrawal-continue with Ativan per protocol.     Diet: Diet Order            Diet renal with fluid restriction Fluid restriction: 1200 mL Fluid; Room service appropriate? Yes; Fluid  consistency: Thin  Diet effective now               DVT Prophylaxis: Prophylactic Heparin   Code Status: Full code   Family Communication: None at bedside  Disposition Plan: Remain inpatient  Barrier to discharge: Significantly worsened renal function with severe metabolic acidosis, urinary retention and UTI- needs initiation of HD during this hospital stay-if no improvement.  Antimicrobial agents: Anti-infectives (From admission, onward)   Start     Dose/Rate Route Frequency Ordered Stop   10/13/18 0000  cefTRIAXone (ROCEPHIN) 1 g in sodium chloride 0.9 % 100 mL IVPB     1 g 200 mL/hr over 30 Minutes Intravenous Every 24 hours 10/12/18 1926        Procedures: None  CONSULTS:  nephrology  Time spent: 25 minutes-Greater than 50% of this time was spent in counseling, explanation of diagnosis, planning of further management, and coordination of care.  MEDICATIONS: Scheduled Meds:  calcitRIOL  0.5 mcg Oral Daily   calcium carbonate  2 tablet Oral TID   folic acid  1 mg Oral Daily   heparin  5,000 Units Subcutaneous Q8H   multivitamin with minerals  1 tablet Oral Daily   nicotine  14 mg Transdermal Daily   sodium chloride flush  3 mL Intravenous Once   thiamine  100 mg Oral Daily   Continuous Infusions:  calcium gluconate     cefTRIAXone (ROCEPHIN)  IV 1 g (10/14/18 0037)    sodium bicarbonate (isotonic) infusion in sterile water 100 mL/hr at 10/14/18 0126   PRN Meds:.acetaminophen, hydrALAZINE, LORazepam **OR** LORazepam, morphine  injection, ondansetron (ZOFRAN) IV   PHYSICAL EXAM: Vital signs: Vitals:   10/13/18 1606 10/13/18 2157 10/14/18 0524 10/14/18 0730  BP: (!) 151/81 (!) 149/81 (!) 141/82 139/83  Pulse: 78 83 69 74  Resp: 18 18 19 16   Temp: 98.2 F (36.8 C) 98.9 F (37.2 C) 98.1 F (36.7 C) 98 F (36.7 C)  TempSrc: Oral Oral Oral Oral  SpO2: 100% 99% 99% 98%  Weight: 79.8 kg 79.8 kg    Height: 5' 10.5" (1.791 m)      Filed  Weights   10/13/18 1606 10/13/18 2157  Weight: 79.8 kg 79.8 kg   Body mass index is 24.89 kg/m.   Gen Exam:Alert awake-not in any distress HEENT:atraumatic, normocephalic Chest: B/L clear to auscultation anteriorly CVS:S1S2 regular Abdomen:soft non tender, non distended Extremities:no edema Neurology: Non focal Skin: no rash I have personally reviewed following labs and imaging studies  LABORATORY DATA: CBC: Recent Labs  Lab 10/11/18 1132 10/12/18 1440 10/13/18 0422 10/14/18 0409  WBC 7.0 6.3 6.7 6.6  HGB 6.3* 6.9* 7.6* 7.3*  HCT 20.3* 22.0* 23.9* 21.5*  MCV 89 96.5 94.1 90.7  PLT 292 308 280 0000000    Basic Metabolic Panel: Recent Labs  Lab 10/11/18 1132 10/12/18 1440 10/13/18 0422 10/13/18 0423 10/14/18 0409  NA 138 133* 140 140 140  K 4.9 5.0 4.5 4.5 3.5  CL 105 104 107 106 99  CO2 14* 12* 20* 19* 26  GLUCOSE 79 135* 88 87 107*  BUN 103* 113* 106* 109* 104*  CREATININE 12.04* 13.19* 12.63* 12.78* 11.82*  CALCIUM 6.0* 5.9* 5.9* 5.9* 5.7*  MG  --   --  1.9  --   --   PHOS  --   --  7.1* 7.0* 6.1*    GFR: Estimated Creatinine Clearance: 6.4 mL/min (A) (by C-G formula based on SCr of 11.82 mg/dL (H)).  Liver Function Tests: Recent Labs  Lab 10/13/18 0423 10/14/18 0409  ALBUMIN 2.8* 2.6*   No results for input(s): LIPASE, AMYLASE in the last 168 hours. Recent Labs  Lab 10/11/18 1132  AMMONIA 67    Coagulation Profile: No results for input(s): INR, PROTIME in the last 168 hours.  Cardiac Enzymes: No results for input(s): CKTOTAL, CKMB, CKMBINDEX, TROPONINI in the last 168 hours.  BNP (last 3 results) No results for input(s): PROBNP in the last 8760 hours.  HbA1C: Recent Labs    10/13/18 0422  HGBA1C <4.2*    CBG: Recent Labs  Lab 10/12/18 1631  GLUCAP 104*    Lipid Profile: No results for input(s): CHOL, HDL, LDLCALC, TRIG, CHOLHDL, LDLDIRECT in the last 72 hours.  Thyroid Function Tests: No results for input(s): TSH, T4TOTAL,  FREET4, T3FREE, THYROIDAB in the last 72 hours.  Anemia Panel: Recent Labs    10/12/18 1730 10/13/18 0422  VITAMINB12  --  327  FOLATE  --  8.2  FERRITIN  --  417*  TIBC  --  203*  IRON  --  60  RETICCTPCT 2.6  --     Urine analysis:    Component Value Date/Time   COLORURINE YELLOW 10/12/2018 1917   APPEARANCEUR CLOUDY (A) 10/12/2018 1917   LABSPEC 1.005 10/12/2018 1917   PHURINE 6.0 10/12/2018 1917   GLUCOSEU NEGATIVE 10/12/2018 1917   HGBUR MODERATE (A) 10/12/2018 1917   BILIRUBINUR NEGATIVE 10/12/2018 1917   BILIRUBINUR negative 04/26/2018 1049   KETONESUR NEGATIVE 10/12/2018 1917   PROTEINUR 100 (A) 10/12/2018 1917   UROBILINOGEN 0.2 04/26/2018 1049  NITRITE NEGATIVE 10/12/2018 1917   LEUKOCYTESUR LARGE (A) 10/12/2018 1917    Sepsis Labs: Lactic Acid, Venous    Component Value Date/Time   LATICACIDVEN 1.1 05/03/2018 1623    MICROBIOLOGY: Recent Results (from the past 240 hour(s))  SARS CORONAVIRUS 2 (TAT 6-24 HRS) Nasopharyngeal Nasopharyngeal Swab     Status: None   Collection Time: 10/12/18  5:17 PM   Specimen: Nasopharyngeal Swab  Result Value Ref Range Status   SARS Coronavirus 2 NEGATIVE NEGATIVE Final    Comment: (NOTE) SARS-CoV-2 target nucleic acids are NOT DETECTED. The SARS-CoV-2 RNA is generally detectable in upper and lower respiratory specimens during the acute phase of infection. Negative results do not preclude SARS-CoV-2 infection, do not rule out co-infections with other pathogens, and should not be used as the sole basis for treatment or other patient management decisions. Negative results must be combined with clinical observations, patient history, and epidemiological information. The expected result is Negative. Fact Sheet for Patients: SugarRoll.be Fact Sheet for Healthcare Providers: https://www.woods-mathews.com/ This test is not yet approved or cleared by the [place] FDA and  has  been authorized for detection and/or diagnosis of SARS-CoV-2 by FDA under an Emergency Use Authorization (EUA). This EUA will remain  in effect (meaning this test can be used) for the duration of the COVID-19 declaration under Section 56 4(b)(1) of the Act, 21 U.S.C. section 360bbb-3(b)(1), unless the authorization is terminated or revoked sooner. Performed at [facility], [place] [address], [place], [place] [address]   Urine culture     Status: Abnormal   Collection Time: 10/12/18  5:19 PM   Specimen: Urine, Random  Result Value Ref Range Status   Specimen Description URINE, RANDOM  Final   Special Requests   Final    NONE Performed at [facility], [place] [address] [place], [place] [address]    Culture >=100,000 COLONIES/mL STAPHYLOCOCCUS EPIDERMIDIS (A)  Final   Report Status 10/14/2018 FINAL  Final   Organism ID, Bacteria STAPHYLOCOCCUS EPIDERMIDIS (A)  Final      Susceptibility   Staphylococcus epidermidis - MIC*    CIPROFLOXACIN >=8 RESISTANT Resistant     GENTAMICIN <=0.5 SENSITIVE Sensitive     NITROFURANTOIN <=16 SENSITIVE Sensitive     OXACILLIN <=0.25 SENSITIVE Sensitive     TETRACYCLINE <=1 SENSITIVE Sensitive     VANCOMYCIN 2 SENSITIVE Sensitive     TRIMETH/SULFA 160 RESISTANT Resistant     CLINDAMYCIN <=0.25 SENSITIVE Sensitive     RIFAMPIN <=0.5 SENSITIVE Sensitive     Inducible Clindamycin NEGATIVE Sensitive     * >=100,000 COLONIES/mL STAPHYLOCOCCUS EPIDERMIDIS    RADIOLOGY STUDIES/RESULTS: Ct Renal Stone Study  Result Date: 10/12/2018 CLINICAL DATA:  Previous history of hydronephrosis. EXAM: CT ABDOMEN AND PELVIS WITHOUT CONTRAST TECHNIQUE: Multidetector CT imaging of the abdomen and pelvis was performed following the standard protocol without IV contrast. COMPARISON:  Ultrasound 05/04/2018 FINDINGS: Lower chest: Mild patchy scarring or atelectasis at both lung bases. No pleural effusion. Hepatobiliary: Normal without contrast. Pancreas: Normal  Spleen: Normal Adrenals/Urinary Tract: Adrenal glands are normal. There is bilateral hydronephrosis at the level of the kidneys and renal pelves. I cannot clearly identify dilated ureters. Catheter is present in the bladder. I do not see evidence of stone disease. Stomach/Bowel: Chronic diverticulosis of the colon without evidence of acute diverticulitis. No acute bowel pathology is seen. Vascular/Lymphatic: No aortic calcification or aortic aneurysm. IVC is normal. No retroperitoneal adenopathy. Reproductive: Enlarged prostate. Other: No free fluid or  air. Musculoskeletal: Ordinary lumbar degenerative disc disease and degenerative facet disease. IMPRESSION: Chronic fullness of the renal collecting systems and renal pelves. I cannot clearly identify dilated ureters. No evidence of stone disease. Foley catheter in the bladder. Enlarged prostate. The findings could relate to chronic UPJ stenoses or could be sequela of chronic bladder outlet obstruction relieved by the Foley catheter. Electronically Signed   By: Nelson Chimes M.D.   On: 10/12/2018 20:53     LOS: 2 days   Oren Binet, MD  [facility]  If 7PM-7AM, please contact night-coverage  Please page via [email or web address]  Go to [email or web address] and use [place]'s universal password to access. If you do not have the password, please contact the hospital operator.  Locate the [facility] provider you are looking for under [facility] and page to a number that you can be directly reached. If you still have difficulty reaching the provider, please page the [facility] (Director on Call) for the Hospitalists listed on amion for assistance.  10/14/2018, 10:57 AM

## 2018-10-14 NOTE — Progress Notes (Signed)
Charge RN medicated pt for primary RN. Didn't inform charge RN of blood in urine, gave heparin SQ, will wait for AM labs to see H&H.

## 2018-10-15 ENCOUNTER — Inpatient Hospital Stay (HOSPITAL_COMMUNITY): Payer: Medicare Other

## 2018-10-15 DIAGNOSIS — N186 End stage renal disease: Secondary | ICD-10-CM

## 2018-10-15 LAB — CBC
HCT: 24.5 % — ABNORMAL LOW (ref 39.0–52.0)
Hemoglobin: 8.1 g/dL — ABNORMAL LOW (ref 13.0–17.0)
MCH: 30.3 pg (ref 26.0–34.0)
MCHC: 33.1 g/dL (ref 30.0–36.0)
MCV: 91.8 fL (ref 80.0–100.0)
Platelets: 300 10*3/uL (ref 150–400)
RBC: 2.67 MIL/uL — ABNORMAL LOW (ref 4.22–5.81)
RDW: 14 % (ref 11.5–15.5)
WBC: 6 10*3/uL (ref 4.0–10.5)
nRBC: 0 % (ref 0.0–0.2)

## 2018-10-15 LAB — RENAL FUNCTION PANEL
Albumin: 2.7 g/dL — ABNORMAL LOW (ref 3.5–5.0)
Anion gap: 14 (ref 5–15)
BUN: 98 mg/dL — ABNORMAL HIGH (ref 8–23)
CO2: 28 mmol/L (ref 22–32)
Calcium: 6.6 mg/dL — ABNORMAL LOW (ref 8.9–10.3)
Chloride: 97 mmol/L — ABNORMAL LOW (ref 98–111)
Creatinine, Ser: 11.22 mg/dL — ABNORMAL HIGH (ref 0.61–1.24)
GFR calc Af Amer: 5 mL/min — ABNORMAL LOW (ref >60)
GFR calc non Af Amer: 4 mL/min — ABNORMAL LOW (ref >60)
Glucose, Bld: 92 mg/dL (ref 70–99)
Phosphorus: 6.2 mg/dL — ABNORMAL HIGH (ref 2.5–4.6)
Potassium: 3.4 mmol/L — ABNORMAL LOW (ref 3.5–5.1)
Sodium: 139 mmol/L (ref 135–145)

## 2018-10-15 NOTE — Progress Notes (Signed)
[name] Progress Note   Assessment/ Plan:   #AKI on CKD 5 due to obstructive uropathy: The creatinine level 13.19 associated with acidosis, anemia and hypocalcemia.  This is probably progressive CKD to ESRD.  Unsure if he was evaluated by urologist however, at this time I do not think he has much renal function to salvage. -Continue Foley catheter.  Noncompliant with CIC  -CT scan of kidneys to better evaluate hydronephrosis--> revealed chronic hydro, bladder decompressed by Foley.   -Check UA and urine culture-- on CTX now -ordered vein mapping, have c/s VVS for [facility] and perm access- appreciate assistance, to occur on Monday - discussed with pt--> have told him that I think this represents ESRD, don't know how much insight into condition he has- will continue to monitor for need to start HD over the weeked-- if asterixis improves over weekend as well as Cr could potentially do just the fistula and hold off on TDC--> my suspicion is that he will need to start HD.  #Bilateral hydronephrosis/neurogenic bladder: He is supposed to CIC per his report but unclear how much he's doing.    #Metabolic acidosis: improved with bicarb, stop today  #Anemia due to CKD: Noted plan for blood transfusion.  Will start ESA  #Hypocalcemia: Replete with Ca gluc  Check phosphorus, PTH level--> pending  Resume calcitriol. TUMS 2 tablets TID.  #Hypertension: not on antihypertensives at present-- would favor amlodipine if needed to start  Subjective:    VVS consulted yesterday, appreciate assistance.  His Cr continues to slowly trickle downward but still has some asterixis today.  He wonders if he will have to start HD this admission   Objective:   BP (!) 142/87 (BP Location: Right Arm)   Pulse 69   Temp 98.6 F (37 C) (Oral)   Resp 14   Ht 5' 10.5" (1.791 m)   Wt 79.8 kg   SpO2 97%   BMI 24.89 kg/m   Physical Exam: Gen: older gentleman, NAD CVS:RRR no m/r/g Resp: clear  bilaterally Abd: NABS, nontender, can't feel bladder Ext: trace LE edema GU: + lots of urine in bag NEURO: + asterixis  Labs: BMET Recent Labs  Lab 10/11/18 1132 10/12/18 1440 10/13/18 0422 10/13/18 0423 10/14/18 0409 10/15/18 0405  NA 138 133* 140 140 140 139  K 4.9 5.0 4.5 4.5 3.5 3.4*  CL 105 104 107 106 99 97*  CO2 14* 12* 20* 19* 26 28  GLUCOSE 79 135* 88 87 107* 92  BUN 103* 113* 106* 109* 104* 98*  CREATININE 12.04* 13.19* 12.63* 12.78* 11.82* 11.22*  CALCIUM 6.0* 5.9* 5.9*  6.1* 5.9* 5.7* 6.6*  PHOS  --   --  7.1* 7.0* 6.1* 6.2*   CBC Recent Labs  Lab 10/12/18 1440 10/13/18 0422 10/14/18 0409 10/15/18 0405  WBC 6.3 6.7 6.6 6.0  HGB 6.9* 7.6* 7.3* 8.1*  HCT 22.0* 23.9* 21.5* 24.5*  MCV 96.5 94.1 90.7 91.8  PLT 308 280 261 300    @IMGRELPRIORS @ Medications:    . calcitRIOL  0.5 mcg Oral Daily  . calcium carbonate  2 tablet Oral TID  . darbepoetin (ARANESP) injection - NON-DIALYSIS  100 mcg Subcutaneous Q Thu-1800  . folic acid  1 mg Oral Daily  . heparin  5,000 Units Subcutaneous Q8H  . multivitamin with minerals  1 tablet Oral Daily  . nicotine  14 mg Transdermal Daily  . sodium chloride flush  3 mL Intravenous Once  . thiamine  100 mg Oral  Daily     Madelon Lips, MD [place] pgr [phone] 10/15/2018, 1:39 PM

## 2018-10-15 NOTE — Plan of Care (Signed)
  Problem: Education: Goal: Knowledge of General Education information will improve Description Including pain rating scale, medication(s)/side effects and non-pharmacologic comfort measures Outcome: Progressing   

## 2018-10-15 NOTE — Progress Notes (Signed)
   Vein mapping is pending.  Does have appear to have suitable vein on his nondominant left arm.  Plan for tunneled dialysis catheter and fistula versus graft on Monday.  N.p.o. past midnight Sunday.  Ersel Wadleigh C. Donzetta Matters, MD [facility] Office: [phone] Pager: [phone]

## 2018-10-15 NOTE — Progress Notes (Signed)
Renal Navigator met with patient at bedside to discuss OP HD treatment in order to make referral. Patient was pleasant and receptive of visit. He states he is somewhat scared of the decision to start dialysis, but looks at it as "positive" as this is a way to continue living. He states he has a great support system and many people praying for him. He reports that spirituality is important to him and also a coping mechanism when he is stressed. Patient stated understanding that he will have to go to treatment 3x per week, as Renal Navigator stressed the importance of attending all treatments.  Necessary information gathered to make OP HD referral and Navigator will submit referral once patient has had HD initiated (access planned for Monday per notes).   Shaw, Colleen Elizabeth, LCSW Renal Navigator [phone] 

## 2018-10-15 NOTE — Progress Notes (Signed)
PROGRESS NOTE        PATIENT DETAILS Name: Matthew [name] Age: 68 y.o. Sex: male Date of Birth: [birth year] Admit Date: 10/12/2018 Admitting Physician Hosie Poisson, MD [id number], Ranell Patrick, MD  Brief Narrative: Patient is a 68 y.o. male with history of CKD stage V secondary to obstructive uropathy, hypertension-sent to the ED by primary care practitioner for worsening renal failure-now thought to have likely ESRD.  See below for further details.  Subjective: No major issues overnight-denies any chest pain or shortness of breath.  Assessment/Plan: AKI on CKD stage V-most likely has progressed to ESRD: Nephrology following-with plans to start hemodialysis this admission.  Electrolytes currently stable-no urgent indication for HD.  Vascular surgery planning on tunneled dialysis catheter and fistula/graft placement on Monday.  History of chronic obstructive uropathy with acute urinary retention on admission: CT renal stone study-did not show any obvious hydronephrosis-Foley catheter in place (had 1 L of urine output when Foley was placed)  Complicated UTI: UA grossly abnormal-had urinary retention admission as well-continue Ancef-we will plan for total of 7-day course of antibiotics-stop date of 9/21.  Positive for staph epidermidis.  Anemia: Suspect secondary to CKD-no evidence of blood loss-patient is s/p 1 unit of PRBC on 9/15.  Remains on darbepoetin.  Hemoglobin slowly improving.  Follow.   Metabolic acidosis: Secondary to AKI-improved with bicarb supplementation  Hypocalcemia: Probably secondary to CKD-improving with calcitriol and calcium carbonate.   HTN: Blood pressure controlled-we will start antihypertensives as needed  Alcohol abuse: She claimed last drink was 2 days prior to this hospital stay-now claims that is approximately a week back-in any event-no signs of withdrawal-continue with Ativan per protocol.     Diet: Diet Order            Diet  renal with fluid restriction Fluid restriction: 1200 mL Fluid; Room service appropriate? Yes; Fluid consistency: Thin  Diet effective now               DVT Prophylaxis: Prophylactic Heparin   Code Status: Full code   Family Communication: None at bedside  Disposition Plan: Remain inpatient  Barrier to discharge: Significantly worsened renal function with severe metabolic acidosis, urinary retention and UTI- needs initiation of HD during this hospital stay-if no improvement.  Antimicrobial agents: Anti-infectives (From admission, onward)   Start     Dose/Rate Route Frequency Ordered Stop   10/14/18 2200  ceFAZolin (ANCEF) IVPB 1 g/50 mL premix     1 g 100 mL/hr over 30 Minutes Intravenous Every 24 hours 10/14/18 1435     10/13/18 0000  cefTRIAXone (ROCEPHIN) 1 g in sodium chloride 0.9 % 100 mL IVPB  Status:  Discontinued     1 g 200 mL/hr over 30 Minutes Intravenous Every 24 hours 10/12/18 1926 10/14/18 1435      Procedures: None  CONSULTS:  nephrology  Vascular surgery  Time spent: 25 minutes-Greater than 50% of this time was spent in counseling, explanation of diagnosis, planning of further management, and coordination of care.  MEDICATIONS: Scheduled Meds: . calcitRIOL  0.5 mcg Oral Daily  . calcium carbonate  2 tablet Oral TID  . darbepoetin (ARANESP) injection - NON-DIALYSIS  100 mcg Subcutaneous Q Thu-1800  . folic acid  1 mg Oral Daily  . heparin  5,000 Units Subcutaneous Q8H  . multivitamin with minerals  1 tablet Oral Daily  .  nicotine  14 mg Transdermal Daily  . sodium chloride flush  3 mL Intravenous Once  . thiamine  100 mg Oral Daily   Continuous Infusions: .  ceFAZolin (ANCEF) IV 1 g (10/14/18 2211)   PRN Meds:.acetaminophen, hydrALAZINE, LORazepam **OR** LORazepam, morphine injection, ondansetron (ZOFRAN) IV   PHYSICAL EXAM: Vital signs: Vitals:   10/14/18 1600 10/14/18 2046 10/15/18 0557 10/15/18 1027  BP: (!) 154/87 (!) 155/84 131/80  (!) 142/87  Pulse: 84 71 67 69  Resp: (!) 22 18 18 14   Temp: 98.9 F (37.2 C) 98.8 F (37.1 C) 98 F (36.7 C) 98.6 F (37 C)  TempSrc: Oral Oral Oral Oral  SpO2: 97% 99% 98% 97%  Weight:  79.8 kg    Height:       Filed Weights   10/13/18 1606 10/13/18 2157 10/14/18 2046  Weight: 79.8 kg 79.8 kg 79.8 kg   Body mass index is 24.89 kg/m.  Gen Exam:Alert awake-not in any distress HEENT:atraumatic, normocephalic Chest: B/L clear to auscultation anteriorly CVS:S1S2 regular Abdomen:soft non tender, non distended Extremities:no edema Neurology: Non focal Skin: no rash  I have personally reviewed following labs and imaging studies  LABORATORY DATA: CBC: Recent Labs  Lab 10/11/18 1132 10/12/18 1440 10/13/18 0422 10/14/18 0409 10/15/18 0405  WBC 7.0 6.3 6.7 6.6 6.0  HGB 6.3* 6.9* 7.6* 7.3* 8.1*  HCT 20.3* 22.0* 23.9* 21.5* 24.5*  MCV 89 96.5 94.1 90.7 91.8  PLT 292 308 280 261 [id number]    Basic Metabolic Panel: Recent Labs  Lab 10/12/18 1440 10/13/18 0422 10/13/18 0423 10/14/18 0409 10/15/18 0405  NA 133* 140 140 140 139  K 5.0 4.5 4.5 3.5 3.4*  CL 104 107 106 99 97*  CO2 12* 20* 19* 26 28  GLUCOSE 135* 88 87 107* 92  BUN 113* 106* 109* 104* 98*  CREATININE 13.19* 12.63* 12.78* 11.82* 11.22*  CALCIUM 5.9* 5.9*  6.1* 5.9* 5.7* 6.6*  MG  --  1.9  --   --   --   PHOS  --  7.1* 7.0* 6.1* 6.2*    GFR: Estimated Creatinine Clearance: 6.7 mL/min (A) (by C-G formula based on SCr of 11.22 mg/dL (H)).  Liver Function Tests: Recent Labs  Lab 10/13/18 0423 10/14/18 0409 10/15/18 0405  ALBUMIN 2.8* 2.6* 2.7*   No results for input(s): LIPASE, AMYLASE in the last 168 hours. Recent Labs  Lab 10/11/18 1132  AMMONIA 67    Coagulation Profile: No results for input(s): INR, PROTIME in the last 168 hours.  Cardiac Enzymes: No results for input(s): CKTOTAL, CKMB, CKMBINDEX, TROPONINI in the last 168 hours.  BNP (last 3 results) No results for input(s): PROBNP in  the last 8760 hours.  HbA1C: Recent Labs    10/13/18 0422  HGBA1C <4.2*    CBG: Recent Labs  Lab 10/12/18 1631  GLUCAP 104*    Lipid Profile: No results for input(s): CHOL, HDL, LDLCALC, TRIG, CHOLHDL, LDLDIRECT in the last 72 hours.  Thyroid Function Tests: No results for input(s): TSH, T4TOTAL, FREET4, T3FREE, THYROIDAB in the last 72 hours.  Anemia Panel: Recent Labs    10/12/18 1730 10/13/18 0422  VITAMINB12  --  327  FOLATE  --  8.2  FERRITIN  --  417*  TIBC  --  203*  IRON  --  60  RETICCTPCT 2.6  --     Urine analysis:    Component Value Date/Time   COLORURINE YELLOW 10/12/2018 1917   APPEARANCEUR CLOUDY (A) 10/12/2018 1917  LABSPEC 1.005 10/12/2018 1917   PHURINE 6.0 10/12/2018 [place] 10/12/2018 1917   HGBUR MODERATE (A) 10/12/2018 [place] 10/12/2018 1917   BILIRUBINUR negative 04/26/2018 [place] 10/12/2018 1917   PROTEINUR 100 (A) 10/12/2018 1917   UROBILINOGEN 0.2 04/26/2018 1049   NITRITE NEGATIVE 10/12/2018 1917   LEUKOCYTESUR LARGE (A) 10/12/2018 1917    Sepsis Labs: Lactic Acid, Venous    Component Value Date/Time   LATICACIDVEN 1.1 05/03/2018 1623    MICROBIOLOGY: Recent Results (from the past 240 hour(s))  SARS CORONAVIRUS 2 (TAT 6-24 HRS) Nasopharyngeal Nasopharyngeal Swab     Status: None   Collection Time: 10/12/18  5:17 PM   Specimen: Nasopharyngeal Swab  Result Value Ref Range Status   SARS Coronavirus 2 NEGATIVE NEGATIVE Final    Comment: (NOTE) SARS-CoV-2 target nucleic acids are NOT DETECTED. The SARS-CoV-2 RNA is generally detectable in upper and lower respiratory specimens during the acute phase of infection. Negative results do not preclude SARS-CoV-2 infection, do not rule out co-infections with other pathogens, and should not be used as the sole basis for treatment or other patient management decisions. Negative results must be combined with clinical  observations, patient history, and epidemiological information. The expected result is Negative. Fact Sheet for Patients: SugarRoll.be Fact Sheet for Healthcare Providers: https://www.woods-mathews.com/ This test is not yet approved or cleared by the [place] FDA and  has been authorized for detection and/or diagnosis of SARS-CoV-2 by FDA under an Emergency Use Authorization (EUA). This EUA will remain  in effect (meaning this test can be used) for the duration of the COVID-19 declaration under Section 56 4(b)(1) of the Act, 21 U.S.C. section 360bbb-3(b)(1), unless the authorization is terminated or revoked sooner. Performed at [facility], [place] [address] [place], [place] [address]   Urine culture     Status: Abnormal   Collection Time: 10/12/18  5:19 PM   Specimen: Urine, Random  Result Value Ref Range Status   Specimen Description URINE, RANDOM  Final   Special Requests   Final    NONE Performed at [facility], [place] [address], [place], [place] [address]    Culture >=100,000 COLONIES/mL STAPHYLOCOCCUS EPIDERMIDIS (A)  Final   Report Status 10/14/2018 FINAL  Final   Organism ID, Bacteria STAPHYLOCOCCUS EPIDERMIDIS (A)  Final      Susceptibility   Staphylococcus epidermidis - MIC*    CIPROFLOXACIN >=8 RESISTANT Resistant     GENTAMICIN <=0.5 SENSITIVE Sensitive     NITROFURANTOIN <=16 SENSITIVE Sensitive     OXACILLIN <=0.25 SENSITIVE Sensitive     TETRACYCLINE <=1 SENSITIVE Sensitive     VANCOMYCIN 2 SENSITIVE Sensitive     TRIMETH/SULFA 160 RESISTANT Resistant     CLINDAMYCIN <=0.25 SENSITIVE Sensitive     RIFAMPIN <=0.5 SENSITIVE Sensitive     Inducible Clindamycin NEGATIVE Sensitive     * >=100,000 COLONIES/mL STAPHYLOCOCCUS EPIDERMIDIS    RADIOLOGY STUDIES/RESULTS: Ct Renal Stone Study  Result Date: 10/12/2018 CLINICAL DATA:  Previous history of hydronephrosis. EXAM: CT ABDOMEN AND PELVIS WITHOUT CONTRAST  TECHNIQUE: Multidetector CT imaging of the abdomen and pelvis was performed following the standard protocol without IV contrast. COMPARISON:  Ultrasound 05/04/2018 FINDINGS: Lower chest: Mild patchy scarring or atelectasis at both lung bases. No pleural effusion. Hepatobiliary: Normal without contrast. Pancreas: Normal Spleen: Normal Adrenals/Urinary Tract: Adrenal glands are normal. There is bilateral hydronephrosis at the level of the kidneys and renal pelves. I cannot  clearly identify dilated ureters. Catheter is present in the bladder. I do not see evidence of stone disease. Stomach/Bowel: Chronic diverticulosis of the colon without evidence of acute diverticulitis. No acute bowel pathology is seen. Vascular/Lymphatic: No aortic calcification or aortic aneurysm. IVC is normal. No retroperitoneal adenopathy. Reproductive: Enlarged prostate. Other: No free fluid or air. Musculoskeletal: Ordinary lumbar degenerative disc disease and degenerative facet disease. IMPRESSION: Chronic fullness of the renal collecting systems and renal pelves. I cannot clearly identify dilated ureters. No evidence of stone disease. Foley catheter in the bladder. Enlarged prostate. The findings could relate to chronic UPJ stenoses or could be sequela of chronic bladder outlet obstruction relieved by the Foley catheter. Electronically Signed   By: Nelson Chimes M.D.   On: 10/12/2018 20:53     LOS: 3 days   Oren Binet, MD  [facility]  If 7PM-7AM, please contact night-coverage  Please page via [email or web address]  Go to [email or web address] and use [facility]'s universal password to access. If you do not have the password, please contact the hospital operator.  Locate the [facility] provider you are looking for under [facility] and page to a number that you can be directly reached. If you still have difficulty reaching the provider, please page the [facility] (Director on Call) for the Hospitalists listed on amion for assistance.   10/15/2018, 11:26 AM

## 2018-10-15 NOTE — Evaluation (Signed)
Physical Therapy Evaluation Patient Details Name: Matthew [name] MRN: [id number] DOB: 05/30/1950 Today's Date: 10/15/2018   History of Present Illness   68 yo male with onset of acute urinary retention and UTI was admitted, and will have a HD cath installed soon.  Pt has been transfused, has metabolic acidosis and noted AKI with his CKD at 5.  Pt is Covid (-).  PMHx:  chronic obstructive uropathy, neurogenic bladder, EtOH abuse,   Clinical Impression  Pt was seen for mobility and strength testing with a noted difficulty being able to isolate and use LE mm's, as well as control of RLE with gait. Pt is not diagnosed with any conditions that would indicate a neuro change on R, did not report a stroke to PT.  Follow acutely and work toward his safe transfer home with HHPT, with family to monitor him for safety.  See if he will need to receive an AD since pt is able to walk with gait belt and min guard.  Currently has not been using one.    Follow Up Recommendations Home health PT;Supervision for mobility/OOB;Other (comment)(assess need for AD ongoing)    Equipment Recommendations  None recommended by PT    Recommendations for Other Services       Precautions / Restrictions Precautions Precautions: Fall Precaution Comments: foley catheter Restrictions Weight Bearing Restrictions: No      Mobility  Bed Mobility Overal bed mobility: Needs Assistance Bed Mobility: Supine to Sit;Sit to Supine     Supine to sit: Min assist Sit to supine: Min assist   General bed mobility comments: minor help to support trunk and sit up but then min to get legs onto bed  Transfers Overall transfer level: Needs assistance Equipment used: 1 person hand held assist Transfers: Sit to/from Stand Sit to Stand: Min guard         General transfer comment: min guard from bedside with pt performing the full effort to stand slowly  Ambulation/Gait Ambulation/Gait assistance: Min guard;Min assist Gait  Distance (Feet): 120 Feet Assistive device: 1 person hand held assist Gait Pattern/deviations: Step-through pattern;Wide base of support;Decreased stride length Gait velocity: reduced Gait velocity interpretation: <1.8 ft/sec, indicate of risk for recurrent falls General Gait Details: steps carefully with RLE as if this is weaker and has mild difficulty using RLE to test strength after.  He needed extra time to understand mm testing and then could demonstrate better mm strength  Stairs            Wheelchair Mobility    Modified Rankin (Stroke Patients Only)       Balance Overall balance assessment: Needs assistance;History of Falls(fell two weeks ago when pt fell asleep at kitchen table ) Sitting-balance support: Feet supported Sitting balance-Leahy Scale: Fair     Standing balance support: Single extremity supported;During functional activity Standing balance-Leahy Scale: Fair Standing balance comment: fair to walk with min guard                             Pertinent Vitals/Pain Pain Assessment: No/denies pain    Home Living Family/patient expects to be discharged to:: Private residence Living Arrangements: Children Available Help at Discharge: Family;Available 24 hours/day(2 daughters and granddaughter) Type of Home: House Home Access: Stairs to enter Entrance Stairs-Rails: Right;Left;Can reach both Entrance Stairs-Number of Steps: 3(1 with no rails at another door) Home Layout: One level Home Equipment: Walker - 2 wheels;Cane - single point;Grab bars - tub/shower(this was  from another stay, pt shared he had no equipment)      Prior Function Level of Independence: Needs assistance   Gait / Transfers Assistance Needed: no recent DME needed  ADL's / Homemaking Assistance Needed: pt does his own dressing and bathing with set up  Comments: no longer drives     Hand Dominance   Dominant Hand: Right    Extremity/Trunk Assessment   Upper Extremity  Assessment Upper Extremity Assessment: Overall WFL for tasks assessed    Lower Extremity Assessment Lower Extremity Assessment: Overall WFL for tasks assessed    Cervical / Trunk Assessment Cervical / Trunk Assessment: Normal  Communication   Communication: HOH  Cognition Arousal/Alertness: Awake/alert Behavior During Therapy: Flat affect Overall Cognitive Status: Impaired/Different from baseline Area of Impairment: Memory;Safety/judgement;Problem solving;Awareness                     Memory: Decreased recall of precautions;Decreased short-term memory   Safety/Judgement: Decreased awareness of safety;Decreased awareness of deficits Awareness: Intellectual Problem Solving: Slow processing General Comments: pt is having some difficulty with PLOF per chart old history and current information      General Comments General comments (skin integrity, edema, etc.): pt was able to demonstrate better tolerance for gait and transfers today after extra time to move.  Demonstrating some loss of RLE movement awareness, no history to clarify    Exercises     Assessment/Plan    PT Assessment Patient needs continued PT services  PT Problem List Decreased strength;Decreased range of motion;Decreased activity tolerance;Decreased balance;Decreased mobility;Decreased coordination;Decreased knowledge of use of DME       PT Treatment Interventions DME instruction;Gait training;Stair training;Functional mobility training;Therapeutic activities;Therapeutic exercise;Balance training;Neuromuscular re-education;Patient/family education    PT Goals (Current goals can be found in the Care Plan section)  Acute Rehab PT Goals Patient Stated Goal: to walk and get home with family PT Goal Formulation: With patient Time For Goal Achievement: 10/29/18 Potential to Achieve Goals: Good    Frequency Min 3X/week   Barriers to discharge Inaccessible home environment stairs to enter house     Co-evaluation               AM-PAC PT "6 Clicks" Mobility  Outcome Measure Help needed turning from your back to your side while in a flat bed without using bedrails?: A Little Help needed moving from lying on your back to sitting on the side of a flat bed without using bedrails?: A Little Help needed moving to and from a bed to a chair (including a wheelchair)?: A Little Help needed standing up from a chair using your arms (e.g., wheelchair or bedside chair)?: A Little Help needed to walk in hospital room?: A Little Help needed climbing 3-5 steps with a railing? : Total 6 Click Score: 16    End of Session Equipment Utilized During Treatment: Gait belt Activity Tolerance: Patient limited by fatigue;Other (comment)(R side awareness of movement reduced) Patient left: in bed;with call bell/phone within reach;with bed alarm set Nurse Communication: Mobility status PT Visit Diagnosis: Unsteadiness on feet (R26.81);Muscle weakness (generalized) (M62.81);Difficulty in walking, not elsewhere classified (R26.2)    Time: [id number] PT Time Calculation (min) (ACUTE ONLY): 37 min   Charges:   PT Evaluation $PT Eval Moderate Complexity: 1 Mod PT Treatments $Gait Training: 8-22 mins       Ramond Dial 10/15/2018, 4:52 PM   Mee Hives, PT MS Acute Rehab Dept. Number: [place] [facility] [place]

## 2018-10-15 NOTE — Progress Notes (Signed)
Bilateral upper extremity vein mapping.  Refer to "CV Proc" under chart review to view preliminary results.  10/15/2018 11:41 AM Maudry Mayhew, MHA, RVT, RDCS, RDMS

## 2018-10-16 LAB — CBC
HCT: 25.8 % — ABNORMAL LOW (ref 39.0–52.0)
Hemoglobin: 8.5 g/dL — ABNORMAL LOW (ref 13.0–17.0)
MCH: 30.5 pg (ref 26.0–34.0)
MCHC: 32.9 g/dL (ref 30.0–36.0)
MCV: 92.5 fL (ref 80.0–100.0)
Platelets: 296 10*3/uL (ref 150–400)
RBC: 2.79 MIL/uL — ABNORMAL LOW (ref 4.22–5.81)
RDW: 13.8 % (ref 11.5–15.5)
WBC: 6.4 10*3/uL (ref 4.0–10.5)
nRBC: 0 % (ref 0.0–0.2)

## 2018-10-16 LAB — RENAL FUNCTION PANEL
Albumin: 2.8 g/dL — ABNORMAL LOW (ref 3.5–5.0)
Anion gap: 17 — ABNORMAL HIGH (ref 5–15)
BUN: 95 mg/dL — ABNORMAL HIGH (ref 8–23)
CO2: 26 mmol/L (ref 22–32)
Calcium: 7 mg/dL — ABNORMAL LOW (ref 8.9–10.3)
Chloride: 94 mmol/L — ABNORMAL LOW (ref 98–111)
Creatinine, Ser: 10.81 mg/dL — ABNORMAL HIGH (ref 0.61–1.24)
GFR calc Af Amer: 5 mL/min — ABNORMAL LOW (ref >60)
GFR calc non Af Amer: 4 mL/min — ABNORMAL LOW (ref >60)
Glucose, Bld: 93 mg/dL (ref 70–99)
Phosphorus: 5.9 mg/dL — ABNORMAL HIGH (ref 2.5–4.6)
Potassium: 3.5 mmol/L (ref 3.5–5.1)
Sodium: 137 mmol/L (ref 135–145)

## 2018-10-16 LAB — OCCULT BLOOD X 1 CARD TO LAB, STOOL: Fecal Occult Bld: NEGATIVE

## 2018-10-16 MED ORDER — LORAZEPAM 1 MG PO TABS
1.0000 mg | ORAL_TABLET | Freq: Once | ORAL | Status: AC
  Administered 2018-10-16: 1 mg via ORAL
  Filled 2018-10-16: qty 1

## 2018-10-16 NOTE — Progress Notes (Signed)
[facility] Progress Note   Assessment/ Plan:   #AKI on CKD 5 due to obstructive uropathy: The creatinine level 13.19 associated with acidosis, anemia and hypocalcemia.  This is probably progressive CKD to ESRD.  Unsure if he was evaluated by urologist however, at this time I do not think he has much renal function to salvage. -Continue Foley catheter.  Noncompliant with CIC  -CT scan of kidneys to better evaluate hydronephrosis--> revealed chronic hydro, bladder decompressed by Foley.   -Check UA and urine culture- on ancef now -ordered vein mapping, have c/s [facility] for [facility] and perm access- appreciate assistance, to occur on Monday - discussed with pt--> have told him that I think this represents ESRD, don't know how much insight into condition he has- will continue to monitor for need to start HD over the weeked-- if asterixis improves over weekend as well as Cr could potentially do just the fistula and hold off on TDC--> my suspicion is that he will need to start HD. Will place orders for HD Monday after [facility]F/ [facility]  #Bilateral hydronephrosis/neurogenic bladder: He is supposed to CIC per his report but unclear how much he's doing.    #Metabolic acidosis: improved with bicarb, stop today  #Anemia due to CKD: Noted plan for blood transfusion.  Will start ESA  #Hypocalcemia: Replete with Ca gluc  Check phosphorus, PTH level--> pending  Resume calcitriol. TUMS 2 tablets TID.  #Hypertension: not on antihypertensives at present-- would favor amlodipine if needed to start  Subjective:    Sleeping, NAD.  Says he has no complaints   Objective:   BP (!) 141/64 (BP Location: Left Arm)   Pulse 69   Temp 98 F (36.7 C) (Oral)   Resp 18   Ht 5' 10.5" (1.791 m)   Wt 76.8 kg   SpO2 98%   BMI 23.95 kg/m   Physical Exam: Gen: older gentleman, NAD CVS:RRR no m/r/g Resp: clear bilaterally Abd: NABS, nontender, can't feel bladder Ext: trace LE edema GU: + lots of urine in  bag NEURO: + asterixis  Labs: BMET Recent Labs  Lab 10/11/18 1132 10/12/18 1440 10/13/18 0422 10/13/18 0423 10/14/18 0409 10/15/18 0405 10/16/18 0719  NA 138 133* 140 140 140 139 137  K 4.9 5.0 4.5 4.5 3.5 3.4* 3.5  CL 105 104 107 106 99 97* 94*  CO2 14* 12* 20* 19* 26 28 26   GLUCOSE 79 135* 88 87 107* 92 93  BUN 103* 113* 106* 109* 104* 98* 95*  CREATININE 12.04* 13.19* 12.63* 12.78* 11.82* 11.22* 10.81*  CALCIUM 6.0* 5.9* 5.9*  6.1* 5.9* 5.7* 6.6* 7.0*  PHOS  --   --  7.1* 7.0* 6.1* 6.2* 5.9*   CBC Recent Labs  Lab 10/13/18 0422 10/14/18 0409 10/15/18 0405 10/16/18 0719  WBC 6.7 6.6 6.0 6.4  HGB 7.6* 7.3* 8.1* 8.5*  HCT 23.9* 21.5* 24.5* 25.8*  MCV 94.1 90.7 91.8 92.5  PLT 280 261 300 296    @IMGRELPRIORS @ Medications:    . calcitRIOL  0.5 mcg Oral Daily  . calcium carbonate  2 tablet Oral TID  . darbepoetin (ARANESP) injection - NON-DIALYSIS  100 mcg Subcutaneous Q Thu-1800  . folic acid  1 mg Oral Daily  . heparin  5,000 Units Subcutaneous Q8H  . multivitamin with minerals  1 tablet Oral Daily  . nicotine  14 mg Transdermal Daily  . sodium chloride flush  3 mL Intravenous Once  . thiamine  100 mg Oral Daily  Madelon Lips, MD [facility] pgr [phone] 10/16/2018, 11:26 AM

## 2018-10-16 NOTE — Plan of Care (Signed)
  Problem: Education: Goal: Knowledge of General Education information will improve Description: Including pain rating scale, medication(s)/side effects and non-pharmacologic comfort measures Outcome: Progressing   Problem: Clinical Measurements: Goal: Diagnostic test results will improve Outcome: Progressing   Problem: Elimination: Goal: Will not experience complications related to bowel motility Outcome: Progressing   

## 2018-10-16 NOTE — Progress Notes (Signed)
PROGRESS NOTE        PATIENT DETAILS Name: Matthew [name] Age: 68 y.o. Sex: male Date of Birth: 1951-01-06 Admit Date: 10/12/2018 Admitting Physician Hosie Poisson, MD [id number], Ranell Patrick, MD  Brief Narrative: Patient is a 68 y.o. male with history of CKD stage V secondary to obstructive uropathy, hypertension-sent to the ED by primary care practitioner for worsening renal failure-now thought to have likely ESRD.  See below for further details.  Subjective: Lying comfortably in bed-no chest pain, shortness of breath.  Assessment/Plan: AKI on CKD stage V-most likely has progressed to ESRD: Nephrology following with plans to start HD this admission, vascular surgery planning on placing [facility] on 9/21.  Electrolytes currently stable with no urgent indication for HD.  Continue to follow electrolytes/volume status closely.    History of chronic obstructive uropathy with acute urinary retention on admission: CT renal stone study-did not show any obvious hydronephrosis-Foley catheter in place (had 1 L of urine output when Foley was placed)  Complicated UTI: UA grossly abnormal-had urinary retention admission as well-continue Ancef-we will plan for total of 7-day course of antibiotics-stop date of 9/21.  Urine culture positive for staph epidermidis.  Anemia: Suspect secondary to CKD-no evidence of blood loss-patient is s/p 1 unit of PRBC on 9/15.  Remains on darbepoetin.  Hemoglobin slowly improving.  Follow.   Metabolic acidosis: Secondary to AKI-improved with bicarb supplementation  Hypocalcemia: Probably secondary to CKD-improving with calcitriol and calcium carbonate.   HTN: BP controlled-resume antihypertensives if blood pressure remains persistently elevated  Alcohol abuse: She claimed last drink was 2 days prior to this hospital stay-now claims that is approximately a week back-in any event-no signs of withdrawal-continue with Ativan per protocol.     Diet:  Diet Order            Diet renal with fluid restriction Fluid restriction: 1200 mL Fluid; Room service appropriate? Yes; Fluid consistency: Thin  Diet effective now               DVT Prophylaxis: Prophylactic Heparin   Code Status: Full code   Family Communication: None at bedside  Disposition Plan: Remain inpatient  Barrier to discharge: Significantly worsened renal function with severe metabolic acidosis, urinary retention and UTI- needs initiation of HD during this hospital stay-if no improvement.  Antimicrobial agents: Anti-infectives (From admission, onward)   Start     Dose/Rate Route Frequency Ordered Stop   10/14/18 2200  ceFAZolin (ANCEF) IVPB 1 g/50 mL premix     1 g 100 mL/hr over 30 Minutes Intravenous Every 24 hours 10/14/18 1435     10/13/18 0000  cefTRIAXone (ROCEPHIN) 1 g in sodium chloride 0.9 % 100 mL IVPB  Status:  Discontinued     1 g 200 mL/hr over 30 Minutes Intravenous Every 24 hours 10/12/18 1926 10/14/18 1435      Procedures: None  CONSULTS:  nephrology  Vascular surgery  Time spent: 25 minutes-Greater than 50% of this time was spent in counseling, explanation of diagnosis, planning of further management, and coordination of care.  MEDICATIONS: Scheduled Meds: . calcitRIOL  0.5 mcg Oral Daily  . calcium carbonate  2 tablet Oral TID  . darbepoetin (ARANESP) injection - NON-DIALYSIS  100 mcg Subcutaneous Q Thu-1800  . folic acid  1 mg Oral Daily  . heparin  5,000 Units Subcutaneous Q8H  . multivitamin  with minerals  1 tablet Oral Daily  . nicotine  14 mg Transdermal Daily  . sodium chloride flush  3 mL Intravenous Once  . thiamine  100 mg Oral Daily   Continuous Infusions: .  ceFAZolin (ANCEF) IV 1 g (10/15/18 2206)   PRN Meds:.acetaminophen, hydrALAZINE, morphine injection, ondansetron (ZOFRAN) IV   PHYSICAL EXAM: Vital signs: Vitals:   10/15/18 1649 10/15/18 1954 10/16/18 0339 10/16/18 0923  BP: (!) 146/82 130/78 (!) 145/82  (!) 141/64  Pulse: 75 79 70 69  Resp: 16 18 18 18   Temp: 98.2 F (36.8 C) 99.2 F (37.3 C) 98 F (36.7 C)   TempSrc: Oral Oral Oral   SpO2: 99% 97% 98% 98%  Weight:   76.8 kg   Height:       Filed Weights   10/13/18 2157 10/14/18 2046 10/16/18 0339  Weight: 79.8 kg 79.8 kg 76.8 kg   Body mass index is 23.95 kg/m.   Gen Exam:Alert awake-not in any distress HEENT:atraumatic, normocephalic Chest: B/L clear to auscultation anteriorly CVS:S1S2 regular Abdomen:soft non tender, non distended Extremities:no edema Neurology: Non focal Skin: no rash  I have personally reviewed following labs and imaging studies  LABORATORY DATA: CBC: Recent Labs  Lab 10/12/18 1440 10/13/18 0422 10/14/18 0409 10/15/18 0405 10/16/18 0719  WBC 6.3 6.7 6.6 6.0 6.4  HGB 6.9* 7.6* 7.3* 8.1* 8.5*  HCT 22.0* 23.9* 21.5* 24.5* 25.8*  MCV 96.5 94.1 90.7 91.8 92.5  PLT 308 280 261 300 0000000    Basic Metabolic Panel: Recent Labs  Lab 10/13/18 0422 10/13/18 0423 10/14/18 0409 10/15/18 0405 10/16/18 0719  NA 140 140 140 139 137  K 4.5 4.5 3.5 3.4* 3.5  CL 107 106 99 97* 94*  CO2 20* 19* 26 28 26   GLUCOSE 88 87 107* 92 93  BUN 106* 109* 104* 98* 95*  CREATININE 12.63* 12.78* 11.82* 11.22* 10.81*  CALCIUM 5.9*  6.1* 5.9* 5.7* 6.6* 7.0*  MG 1.9  --   --   --   --   PHOS 7.1* 7.0* 6.1* 6.2* 5.9*    GFR: Estimated Creatinine Clearance: 7 mL/min (A) (by C-G formula based on SCr of 10.81 mg/dL (H)).  Liver Function Tests: Recent Labs  Lab 10/13/18 0423 10/14/18 0409 10/15/18 0405 10/16/18 0719  ALBUMIN 2.8* 2.6* 2.7* 2.8*   No results for input(s): LIPASE, AMYLASE in the last 168 hours. Recent Labs  Lab 10/11/18 1132  AMMONIA 67    Coagulation Profile: No results for input(s): INR, PROTIME in the last 168 hours.  Cardiac Enzymes: No results for input(s): CKTOTAL, CKMB, CKMBINDEX, TROPONINI in the last 168 hours.  BNP (last 3 results) No results for input(s): PROBNP in the  last 8760 hours.  HbA1C: No results for input(s): HGBA1C in the last 72 hours.  CBG: Recent Labs  Lab 10/12/18 1631  GLUCAP 104*    Lipid Profile: No results for input(s): CHOL, HDL, LDLCALC, TRIG, CHOLHDL, LDLDIRECT in the last 72 hours.  Thyroid Function Tests: No results for input(s): TSH, T4TOTAL, FREET4, T3FREE, THYROIDAB in the last 72 hours.  Anemia Panel: No results for input(s): VITAMINB12, FOLATE, FERRITIN, TIBC, IRON, RETICCTPCT in the last 72 hours.  Urine analysis:    Component Value Date/Time   COLORURINE YELLOW 10/12/2018 1917   APPEARANCEUR CLOUDY (A) 10/12/2018 1917   LABSPEC 1.005 10/12/2018 1917   PHURINE 6.0 10/12/2018 1917   GLUCOSEU NEGATIVE 10/12/2018 1917   HGBUR MODERATE (A) 10/12/2018 1917   BILIRUBINUR NEGATIVE 10/12/2018  [place] negative 04/26/2018 [place] 10/12/2018 1917   PROTEINUR 100 (A) 10/12/2018 1917   UROBILINOGEN 0.2 04/26/2018 1049   NITRITE NEGATIVE 10/12/2018 1917   LEUKOCYTESUR LARGE (A) 10/12/2018 1917    Sepsis Labs: Lactic Acid, Venous    Component Value Date/Time   LATICACIDVEN 1.1 05/03/2018 1623    MICROBIOLOGY: Recent Results (from the past 240 hour(s))  SARS CORONAVIRUS 2 (TAT 6-24 HRS) Nasopharyngeal Nasopharyngeal Swab     Status: None   Collection Time: 10/12/18  5:17 PM   Specimen: Nasopharyngeal Swab  Result Value Ref Range Status   SARS Coronavirus 2 NEGATIVE NEGATIVE Final    Comment: (NOTE) SARS-CoV-2 target nucleic acids are NOT DETECTED. The SARS-CoV-2 RNA is generally detectable in upper and lower respiratory specimens during the acute phase of infection. Negative results do not preclude SARS-CoV-2 infection, do not rule out co-infections with other pathogens, and should not be used as the sole basis for treatment or other patient management decisions. Negative results must be combined with clinical observations, patient history, and epidemiological information. The  expected result is Negative. Fact Sheet for Patients: SugarRoll.be Fact Sheet for Healthcare Providers: https://www.woods-mathews.com/ This test is not yet approved or cleared by the [place] FDA and  has been authorized for detection and/or diagnosis of SARS-CoV-2 by FDA under an Emergency Use Authorization (EUA). This EUA will remain  in effect (meaning this test can be used) for the duration of the COVID-19 declaration under Section 56 4(b)(1) of the Act, 21 U.S.C. section 360bbb-3(b)(1), unless the authorization is terminated or revoked sooner. Performed at [facility], [place] [address] [place], [place] [address]   Urine culture     Status: Abnormal   Collection Time: 10/12/18  5:19 PM   Specimen: Urine, Random  Result Value Ref Range Status   Specimen Description URINE, RANDOM  Final   Special Requests   Final    NONE Performed at [facility], [place] [address], [place], [place] [address]    Culture >=100,000 COLONIES/mL STAPHYLOCOCCUS EPIDERMIDIS (A)  Final   Report Status 10/14/2018 FINAL  Final   Organism ID, Bacteria STAPHYLOCOCCUS EPIDERMIDIS (A)  Final      Susceptibility   Staphylococcus epidermidis - MIC*    CIPROFLOXACIN >=8 RESISTANT Resistant     GENTAMICIN <=0.5 SENSITIVE Sensitive     NITROFURANTOIN <=16 SENSITIVE Sensitive     OXACILLIN <=0.25 SENSITIVE Sensitive     TETRACYCLINE <=1 SENSITIVE Sensitive     VANCOMYCIN 2 SENSITIVE Sensitive     TRIMETH/SULFA 160 RESISTANT Resistant     CLINDAMYCIN <=0.25 SENSITIVE Sensitive     RIFAMPIN <=0.5 SENSITIVE Sensitive     Inducible Clindamycin NEGATIVE Sensitive     * >=100,000 COLONIES/mL STAPHYLOCOCCUS EPIDERMIDIS    RADIOLOGY STUDIES/RESULTS: Ct Renal Stone Study  Result Date: 10/12/2018 CLINICAL DATA:  Previous history of hydronephrosis. EXAM: CT ABDOMEN AND PELVIS WITHOUT CONTRAST TECHNIQUE: Multidetector CT imaging of the abdomen and pelvis was  performed following the standard protocol without IV contrast. COMPARISON:  Ultrasound 05/04/2018 FINDINGS: Lower chest: Mild patchy scarring or atelectasis at both lung bases. No pleural effusion. Hepatobiliary: Normal without contrast. Pancreas: Normal Spleen: Normal Adrenals/Urinary Tract: Adrenal glands are normal. There is bilateral hydronephrosis at the level of the kidneys and renal pelves. I cannot clearly identify dilated ureters. Catheter is present in the bladder. I do not see evidence of stone disease. Stomach/Bowel: Chronic diverticulosis of the colon without evidence of acute  diverticulitis. No acute bowel pathology is seen. Vascular/Lymphatic: No aortic calcification or aortic aneurysm. IVC is normal. No retroperitoneal adenopathy. Reproductive: Enlarged prostate. Other: No free fluid or air. Musculoskeletal: Ordinary lumbar degenerative disc disease and degenerative facet disease. IMPRESSION: Chronic fullness of the renal collecting systems and renal pelves. I cannot clearly identify dilated ureters. No evidence of stone disease. Foley catheter in the bladder. Enlarged prostate. The findings could relate to chronic UPJ stenoses or could be sequela of chronic bladder outlet obstruction relieved by the Foley catheter. Electronically Signed   By: Nelson Chimes M.D.   On: 10/12/2018 20:53   Vas [place] Upper Ext Vein Mapping (pre-op Avf)  Result Date: 10/15/2018 UPPER EXTREMITY VEIN MAPPING  Indications: Pre-access. Comparison Study: No prior study Performing Technologist: Maudry Mayhew MHA, RDMS, RVT, RDCS  Examination Guidelines: A complete evaluation includes B-mode imaging, spectral Doppler, color Doppler, and power Doppler as needed of all accessible portions of each vessel. Bilateral testing is considered an integral part of a complete examination. Limited examinations for reoccurring indications may be performed as noted. +-----------------+-------------+----------+--------------+ Right  Cephalic   Diameter (cm)Depth (cm)   Findings    +-----------------+-------------+----------+--------------+ Shoulder                                not visualized +-----------------+-------------+----------+--------------+ Prox upper arm                          not visualized +-----------------+-------------+----------+--------------+ Mid upper arm                           not visualized +-----------------+-------------+----------+--------------+ Dist upper arm                          not visualized +-----------------+-------------+----------+--------------+ Antecubital fossa                       not visualized +-----------------+-------------+----------+--------------+ Prox forearm                            not visualized +-----------------+-------------+----------+--------------+ Mid forearm                             not visualized +-----------------+-------------+----------+--------------+ Dist forearm                            not visualized +-----------------+-------------+----------+--------------+ Wrist                                   not visualized +-----------------+-------------+----------+--------------+ +-----------------+-------------+----------+--------------+ Right Basilic    Diameter (cm)Depth (cm)   Findings    +-----------------+-------------+----------+--------------+ Prox upper arm       0.41                              +-----------------+-------------+----------+--------------+ Mid upper arm        0.30                              +-----------------+-------------+----------+--------------+ Dist upper arm       0.24                              +-----------------+-------------+----------+--------------+  Antecubital fossa    0.30                  Thrombus    +-----------------+-------------+----------+--------------+ Prox forearm         0.12                               +-----------------+-------------+----------+--------------+ Mid forearm                             not visualized +-----------------+-------------+----------+--------------+ Distal forearm                          not visualized +-----------------+-------------+----------+--------------+ Wrist                                   not visualized +-----------------+-------------+----------+--------------+ +-----------------+-------------+----------+---------+ Left Cephalic    Diameter (cm)Depth (cm)Findings  +-----------------+-------------+----------+---------+ Shoulder             0.12        0.32             +-----------------+-------------+----------+---------+ Prox upper arm       0.08        0.22             +-----------------+-------------+----------+---------+ Mid upper arm        0.10        0.17             +-----------------+-------------+----------+---------+ Dist upper arm       0.10        0.20             +-----------------+-------------+----------+---------+ Antecubital fossa    0.23        0.24   branching +-----------------+-------------+----------+---------+ Prox forearm         0.18        0.26   Thrombus  +-----------------+-------------+----------+---------+ Mid forearm          0.13        0.20             +-----------------+-------------+----------+---------+ Dist forearm         0.17        0.18             +-----------------+-------------+----------+---------+ +-----------------+-------------+----------+--------------+ Left Basilic     Diameter (cm)Depth (cm)   Findings    +-----------------+-------------+----------+--------------+ Prox upper arm       0.28                              +-----------------+-------------+----------+--------------+ Mid upper arm        0.33                              +-----------------+-------------+----------+--------------+ Dist upper arm       0.27                               +-----------------+-------------+----------+--------------+ Antecubital fossa    0.36                              +-----------------+-------------+----------+--------------+ Prox forearm  0.14                 branching    +-----------------+-------------+----------+--------------+ Mid forearm                             not visualized +-----------------+-------------+----------+--------------+ Distal forearm                          not visualized +-----------------+-------------+----------+--------------+ Wrist                                   not visualized +-----------------+-------------+----------+--------------+ *See table(s) above for measurements and observations.  Diagnosing physician:    Preliminary      LOS: 4 days   Oren Binet, MD  [facility]  If 7PM-7AM, please contact night-coverage  Please page via [email or web address]  Go to [email or web address] and use [place]'s universal password to access. If you do not have the password, please contact the hospital operator.  Locate the [facility] provider you are looking for under [facility] and page to a number that you can be directly reached. If you still have difficulty reaching the provider, please page the [facility] (Director on Call) for the Hospitalists listed on amion for assistance.  10/16/2018, 10:57 AM

## 2018-10-16 NOTE — Plan of Care (Signed)
  Problem: Education: Goal: Knowledge of General Education information will improve Description: Including pain rating scale, medication(s)/side effects and non-pharmacologic comfort measures Outcome: Progressing   Problem: Coping: Goal: Level of anxiety will decrease Outcome: Progressing   

## 2018-10-17 LAB — CBC
HCT: 26.5 % — ABNORMAL LOW (ref 39.0–52.0)
Hemoglobin: 8.6 g/dL — ABNORMAL LOW (ref 13.0–17.0)
MCH: 30.7 pg (ref 26.0–34.0)
MCHC: 32.5 g/dL (ref 30.0–36.0)
MCV: 94.6 fL (ref 80.0–100.0)
Platelets: 299 10*3/uL (ref 150–400)
RBC: 2.8 MIL/uL — ABNORMAL LOW (ref 4.22–5.81)
RDW: 13.7 % (ref 11.5–15.5)
WBC: 7.1 10*3/uL (ref 4.0–10.5)
nRBC: 0 % (ref 0.0–0.2)

## 2018-10-17 LAB — RENAL FUNCTION PANEL
Albumin: 2.9 g/dL — ABNORMAL LOW (ref 3.5–5.0)
Anion gap: 18 — ABNORMAL HIGH (ref 5–15)
BUN: 95 mg/dL — ABNORMAL HIGH (ref 8–23)
CO2: 24 mmol/L (ref 22–32)
Calcium: 7.5 mg/dL — ABNORMAL LOW (ref 8.9–10.3)
Chloride: 94 mmol/L — ABNORMAL LOW (ref 98–111)
Creatinine, Ser: 10.52 mg/dL — ABNORMAL HIGH (ref 0.61–1.24)
GFR calc Af Amer: 5 mL/min — ABNORMAL LOW (ref >60)
GFR calc non Af Amer: 5 mL/min — ABNORMAL LOW (ref >60)
Glucose, Bld: 115 mg/dL — ABNORMAL HIGH (ref 70–99)
Phosphorus: 5.6 mg/dL — ABNORMAL HIGH (ref 2.5–4.6)
Potassium: 4 mmol/L (ref 3.5–5.1)
Sodium: 136 mmol/L (ref 135–145)

## 2018-10-17 MED ORDER — HEPARIN SODIUM (PORCINE) 1000 UNIT/ML DIALYSIS
1000.0000 [IU] | INTRAMUSCULAR | Status: DC | PRN
  Administered 2018-10-18: 3400 [IU] via INTRAVENOUS_CENTRAL

## 2018-10-17 MED ORDER — ALTEPLASE 2 MG IJ SOLR: 2.0000 mg | Freq: Once | INTRAMUSCULAR | Status: DC | PRN

## 2018-10-17 MED ORDER — PENTAFLUOROPROP-TETRAFLUOROETH EX AERO: 1.0000 "application " | INHALATION_SPRAY | CUTANEOUS | Status: DC | PRN

## 2018-10-17 MED ORDER — LIDOCAINE-PRILOCAINE 2.5-2.5 % EX CREA: 1.0000 "application " | TOPICAL_CREAM | CUTANEOUS | Status: DC | PRN

## 2018-10-17 MED ORDER — LIDOCAINE HCL (PF) 1 % IJ SOLN: 5.0000 mL | INTRAMUSCULAR | Status: DC | PRN

## 2018-10-17 MED ORDER — SODIUM CHLORIDE 0.9 % IV SOLN
100.0000 mL | INTRAVENOUS | Status: DC | PRN
  Administered 2018-10-18 (×2): via INTRAVENOUS

## 2018-10-17 MED ORDER — CHLORHEXIDINE GLUCONATE CLOTH 2 % EX PADS
6.0000 | MEDICATED_PAD | Freq: Every day | CUTANEOUS | Status: DC
  Administered 2018-10-18 – 2018-10-19 (×2): 6 via TOPICAL

## 2018-10-17 MED ORDER — SODIUM CHLORIDE 0.9 % IV SOLN: 100.0000 mL | INTRAVENOUS | Status: DC | PRN

## 2018-10-17 NOTE — Progress Notes (Signed)
[facility]        +-----------------+-------------+----------+--------------+ Right Cephalic   Diameter (cm)Depth (cm)   Findings    +-----------------+-------------+----------+--------------+ Shoulder                                not visualized +-----------------+-------------+----------+--------------+ Prox upper arm                          not visualized +-----------------+-------------+----------+--------------+ Mid upper arm                           not visualized +-----------------+-------------+----------+--------------+ Dist upper arm                          not visualized +-----------------+-------------+----------+--------------+ Antecubital fossa                       not visualized +-----------------+-------------+----------+--------------+ Prox forearm                            not visualized +-----------------+-------------+----------+--------------+ Mid forearm                             not visualized +-----------------+-------------+----------+--------------+ Dist forearm                            not visualized +-----------------+-------------+----------+--------------+ Wrist                                   not visualized +-----------------+-------------+----------+--------------+  +-----------------+-------------+----------+--------------+ Right Basilic    Diameter (cm)Depth (cm)   Findings    +-----------------+-------------+----------+--------------+ Prox upper arm       0.41                              +-----------------+-------------+----------+--------------+ Mid upper arm        0.30                              +-----------------+-------------+----------+--------------+ Dist upper arm       0.24                              +-----------------+-------------+----------+--------------+ Antecubital fossa    0.30                  Thrombus     +-----------------+-------------+----------+--------------+ Prox forearm         0.12                              +-----------------+-------------+----------+--------------+ Mid forearm                             not visualized +-----------------+-------------+----------+--------------+ Distal forearm                          not visualized +-----------------+-------------+----------+--------------+ Wrist  not visualized +-----------------+-------------+----------+--------------+  +-----------------+-------------+----------+---------+ Left Cephalic    Diameter (cm)Depth (cm)Findings  +-----------------+-------------+----------+---------+ Shoulder             0.12        0.32             +-----------------+-------------+----------+---------+ Prox upper arm       0.08        0.22             +-----------------+-------------+----------+---------+ Mid upper arm        0.10        0.17             +-----------------+-------------+----------+---------+ Dist upper arm       0.10        0.20             +-----------------+-------------+----------+---------+ Antecubital fossa    0.23        0.24   branching +-----------------+-------------+----------+---------+ Prox forearm         0.18        0.26   Thrombus  +-----------------+-------------+----------+---------+ Mid forearm          0.13        0.20             +-----------------+-------------+----------+---------+ Dist forearm         0.17        0.18             +-----------------+-------------+----------+---------+  +-----------------+-------------+----------+--------------+ Left Basilic     Diameter (cm)Depth (cm)   Findings    +-----------------+-------------+----------+--------------+ Prox upper arm       0.28                              +-----------------+-------------+----------+--------------+ Mid upper arm         0.33                              +-----------------+-------------+----------+--------------+ Dist upper arm       0.27                              +-----------------+-------------+----------+--------------+ Antecubital fossa    0.36                              +-----------------+-------------+----------+--------------+ Prox forearm         0.14                 branching    +-----------------+-------------+----------+--------------+ Mid forearm                             not visualized +-----------------+-------------+----------+--------------+ Distal forearm                          not visualized +-----------------+-------------+----------+--------------+ Wrist                                   not visualized +-----------------+-------------+----------+--------------+   Assessment/Planning: ESRD with plans to start HD this admission   He has suitable basilic vein on his nondominant left arm.  Plan for tunneled dialysis catheter and fistula versus graft on Monday.  N.p.o. past midnight  Matthew [name] 10/17/2018 7:20 AM --  Laboratory Lab Results: Recent Labs    10/15/18 0405 10/16/18 0719  WBC 6.0 6.4  HGB 8.1* 8.5*  HCT 24.5* 25.8*  PLT 300 296   BMET Recent Labs    10/15/18 0405 10/16/18 0719  NA 139 137  K 3.4* 3.5  CL 97* 94*  CO2 28 26  GLUCOSE 92 93  BUN 98* 95*  CREATININE 11.22* 10.81*  CALCIUM 6.6* 7.0*    COAG Lab Results  Component Value Date   INR 1.2 05/03/2018   INR 0.8 02/06/2007   No results found for: PTT

## 2018-10-17 NOTE — Plan of Care (Signed)
  Problem: Education: Goal: Knowledge of General Education information will improve Description: Including pain rating scale, medication(s)/side effects and non-pharmacologic comfort measures Outcome: Progressing   Problem: Coping: Goal: Level of anxiety will decrease Outcome: Progressing   Problem: Safety: Goal: Ability to remain free from injury will improve Outcome: Progressing   

## 2018-10-17 NOTE — H&P (View-Only) (Signed)
[facility]  Subjective  - feels ok   Objective (!) 142/79 73 98.6 F (37 C) (Oral) 16 96%  Intake/Output Summary (Last 24 hours) at 10/17/2018 0919 Last data filed at 10/17/2018 0700 Gross per 24 hour  Intake 1310 ml  Output 2650 ml  Net -1340 ml    Assessment/Planning: For left arm AVF vs Graft and catheter by Dr Scot Dock tomorrow, has marginal basilic vein Discussed risk benefit procedure details graft vs fistula with pt today NPO p midnight consent  Ruta Hinds 10/17/2018 9:19 AM --  Laboratory Lab Results: Recent Labs    10/15/18 0405 10/16/18 0719  WBC 6.0 6.4  HGB 8.1* 8.5*  HCT 24.5* 25.8*  PLT 300 296   BMET Recent Labs    10/16/18 0719 10/17/18 0745  NA 137 136  K 3.5 4.0  CL 94* 94*  CO2 26 24  GLUCOSE 93 115*  BUN 95* 95*  CREATININE 10.81* 10.52*  CALCIUM 7.0* 7.5*    COAG Lab Results  Component Value Date   INR 1.2 05/03/2018   INR 0.8 02/06/2007   No results found for: PTT

## 2018-10-17 NOTE — Progress Notes (Signed)
[facility]  Subjective  - feels ok   Objective (!) 142/79 73 98.6 F (37 C) (Oral) 16 96%  Intake/Output Summary (Last 24 hours) at 10/17/2018 0919 Last data filed at 10/17/2018 0700 Gross per 24 hour  Intake 1310 ml  Output 2650 ml  Net -1340 ml    Assessment/Planning: For left arm AVF vs Graft and catheter by Dr Scot Dock tomorrow, has marginal basilic vein Discussed risk benefit procedure details graft vs fistula with pt today NPO p midnight consent  Ruta Hinds 10/17/2018 9:19 AM --  Laboratory Lab Results: Recent Labs    10/15/18 0405 10/16/18 0719  WBC 6.0 6.4  HGB 8.1* 8.5*  HCT 24.5* 25.8*  PLT 300 296   BMET Recent Labs    10/16/18 0719 10/17/18 0745  NA 137 136  K 3.5 4.0  CL 94* 94*  CO2 26 24  GLUCOSE 93 115*  BUN 95* 95*  CREATININE 10.81* 10.52*  CALCIUM 7.0* 7.5*    COAG Lab Results  Component Value Date   INR 1.2 05/03/2018   INR 0.8 02/06/2007   No results found for: PTT

## 2018-10-17 NOTE — Progress Notes (Signed)
  [facility] Progress Note   Assessment/ Plan:   #AKI on CKD 5 due to obstructive uropathy: The creatinine level 13.19 associated with acidosis, anemia and hypocalcemia.  This is probably progressive CKD to ESRD.  Unsure if he was evaluated by urologist however, at this time I do not think he has much renal function to salvage. -Continue Foley catheter.  Noncompliant with CIC  -CT scan of kidneys to better evaluate hydronephrosis--> revealed chronic hydro, bladder decompressed by Foley.   -Check UA and urine culture- on ancef now -ordered vein mapping, have c/s VVS for [facility] and perm access- appreciate assistance, to occur on Monday - discussed with pt--> have told him that I think this represents ESRD, don't know how much insight into condition he has- AVF/ [facility] tomorrow and then HD #1.  #Bilateral hydronephrosis/neurogenic bladder: He is supposed to CIC per his report but unclear how much he's doing.    #Metabolic acidosis: improved with bicarb, stop today  #Anemia due to CKD: Noted plan for blood transfusion.  Will start ESA  #Hypocalcemia: Replete with Ca gluc  Check phosphorus, PTH level--> pending  Resume calcitriol. TUMS 2 tablets TID.  #Hypertension: not on antihypertensives at present-- would favor amlodipine if needed to start  Subjective:    Still with asterixis.  For [facility] and perm access tomorrow followed by HD #1.   Objective:   BP 131/77 (BP Location: Left Arm)   Pulse 72   Temp 98.7 F (37.1 C) (Oral)   Resp 20   Ht 5' 10.5" (1.791 m)   Wt 76.7 kg   SpO2 96%   BMI 23.91 kg/m   Physical Exam: Gen: older gentleman, NAD CVS:RRR no m/r/g Resp: clear bilaterally Abd: NABS, nontender, can't feel bladder Ext: trace LE edema GU: + lots of urine in bag NEURO: + asterixis  Labs: BMET Recent Labs  Lab 10/12/18 1440 10/13/18 0422 10/13/18 0423 10/14/18 0409 10/15/18 0405 10/16/18 0719 10/17/18 0745  NA 133* 140 140 140 139 137 136  K 5.0  4.5 4.5 3.5 3.4* 3.5 4.0  CL 104 107 106 99 97* 94* 94*  CO2 12* 20* 19* 26 28 26 24   GLUCOSE 135* 88 87 107* 92 93 115*  BUN 113* 106* 109* 104* 98* 95* 95*  CREATININE 13.19* 12.63* 12.78* 11.82* 11.22* 10.81* 10.52*  CALCIUM 5.9* 5.9*  6.1* 5.9* 5.7* 6.6* 7.0* 7.5*  PHOS  --  7.1* 7.0* 6.1* 6.2* 5.9* 5.6*   CBC Recent Labs  Lab 10/13/18 0422 10/14/18 0409 10/15/18 0405 10/16/18 0719  WBC 6.7 6.6 6.0 6.4  HGB 7.6* 7.3* 8.1* 8.5*  HCT 23.9* 21.5* 24.5* 25.8*  MCV 94.1 90.7 91.8 92.5  PLT 280 261 300 296    @IMGRELPRIORS @ Medications:    . calcitRIOL  0.5 mcg Oral Daily  . calcium carbonate  2 tablet Oral TID  . darbepoetin (ARANESP) injection - NON-DIALYSIS  100 mcg Subcutaneous Q Thu-1800  . folic acid  1 mg Oral Daily  . heparin  5,000 Units Subcutaneous Q8H  . multivitamin with minerals  1 tablet Oral Daily  . nicotine  14 mg Transdermal Daily  . sodium chloride flush  3 mL Intravenous Once  . thiamine  100 mg Oral Daily     Madelon Lips, MD [place] pgr [phone] 10/17/2018, 1:19 PM

## 2018-10-17 NOTE — Progress Notes (Addendum)
PROGRESS NOTE        PATIENT DETAILS Name: Matthew [name] Age: 68 y.o. Sex: male Date of Birth: [birth year] Admit Date: 10/12/2018 Admitting Physician Hosie Poisson, MD [id number], Ranell Patrick, MD  Brief Narrative: Patient is a 68 y.o. male with history of CKD stage V secondary to obstructive uropathy, hypertension-sent to the ED by primary care practitioner for worsening renal failure-now thought to have likely ESRD.  See below for further details.  Subjective: Lying very comfortably in bed-denies any chest pain or shortness of breath.  Is awake and alert.  No signs of tremors related to alcohol use.  Assessment/Plan: AKI on CKD stage V-most likely has progressed to ESRD: Plans to start HD this admission-vascular surgery planning on [facility] on 9/21.  Electrolytes currently stable.  VVS and nephrology continues to follow.    History of chronic obstructive uropathy with acute urinary retention on admission: CT renal stone study-did not show any obvious hydronephrosis-Foley catheter in place (had 1 L of urine output when Foley was placed)  Complicated UTI: UA grossly abnormal-had urinary retention admission as well-continue Ancef-we will plan for total of 7-day course of antibiotics-stop date of 9/21.  Urine culture positive for staph epidermidis.  Anemia: Suspect secondary to CKD-no evidence of blood loss-patient is s/p 1 unit of PRBC on 9/15.  Remains on darbepoetin.  Hemoglobin slowly improving.  Follow.   Metabolic acidosis: Secondary to AKI-improved with bicarb supplementation  Hypocalcemia: Probably secondary to CKD-improving with calcitriol and calcium carbonate.  HTN: BP controlled-resume antihypertensives if blood pressure remains persistently elevated  Alcohol abuse: Last drink 2 days prior to this hospital stay-no signs of alcohol withdrawal.  Diet: Diet Order            Diet NPO time specified Except for: Sips with Meds  Diet effective midnight        Diet renal with fluid restriction Fluid restriction: 1200 mL Fluid; Room service appropriate? Yes; Fluid consistency: Thin  Diet effective now               DVT Prophylaxis: Prophylactic Heparin   Code Status: Full code   Family Communication: None at bedside  Disposition Plan: Remain inpatient  Barrier to discharge: Significantly worsened renal function with severe metabolic acidosis, urinary retention and UTI- needs initiation of HD during this hospital stay-if no improvement.  Antimicrobial agents: Anti-infectives (From admission, onward)   Start     Dose/Rate Route Frequency Ordered Stop   10/14/18 2200  ceFAZolin (ANCEF) IVPB 1 g/50 mL premix     1 g 100 mL/hr over 30 Minutes Intravenous Every 24 hours 10/14/18 1435     10/13/18 0000  cefTRIAXone (ROCEPHIN) 1 g in sodium chloride 0.9 % 100 mL IVPB  Status:  Discontinued     1 g 200 mL/hr over 30 Minutes Intravenous Every 24 hours 10/12/18 1926 10/14/18 1435      Procedures: None  CONSULTS:  nephrology  Vascular surgery  Time spent: 25 minutes-Greater than 50% of this time was spent in counseling, explanation of diagnosis, planning of further management, and coordination of care.  MEDICATIONS: Scheduled Meds: . calcitRIOL  0.5 mcg Oral Daily  . calcium carbonate  2 tablet Oral TID  . darbepoetin (ARANESP) injection - NON-DIALYSIS  100 mcg Subcutaneous Q Thu-1800  . folic acid  1 mg Oral Daily  . heparin  5,000  Units Subcutaneous Q8H  . multivitamin with minerals  1 tablet Oral Daily  . nicotine  14 mg Transdermal Daily  . sodium chloride flush  3 mL Intravenous Once  . thiamine  100 mg Oral Daily   Continuous Infusions: .  ceFAZolin (ANCEF) IV Stopped (10/16/18 2216)   PRN Meds:.acetaminophen, hydrALAZINE, morphine injection, ondansetron (ZOFRAN) IV   PHYSICAL EXAM: Vital signs: Vitals:   10/16/18 1745 10/16/18 2042 10/17/18 0419 10/17/18 0937  BP: (!) 150/72 (!) 144/80 (!) 142/79 131/77  Pulse:  75 84 73 72  Resp: 18 18 16 20   Temp: 98.9 F (37.2 C) 99.2 F (37.3 C) 98.6 F (37 C)   TempSrc: Oral Oral Oral   SpO2: 97% 98% 96% 96%  Weight:   76.7 kg   Height:       Filed Weights   10/14/18 2046 10/16/18 0339 10/17/18 0419  Weight: 79.8 kg 76.8 kg 76.7 kg   Body mass index is 23.91 kg/m.   Gen Exam:Alert awake-not in any distress HEENT:atraumatic, normocephalic Chest: B/L clear to auscultation anteriorly CVS:S1S2 regular Abdomen:soft non tender, non distended Extremities:no edema Neurology: Non focal Skin: no rash  I have personally reviewed following labs and imaging studies  LABORATORY DATA: CBC: Recent Labs  Lab 10/12/18 1440 10/13/18 0422 10/14/18 0409 10/15/18 0405 10/16/18 0719  WBC 6.3 6.7 6.6 6.0 6.4  HGB 6.9* 7.6* 7.3* 8.1* 8.5*  HCT 22.0* 23.9* 21.5* 24.5* 25.8*  MCV 96.5 94.1 90.7 91.8 92.5  PLT 308 280 261 300 0000000    Basic Metabolic Panel: Recent Labs  Lab 10/13/18 0422 10/13/18 0423 10/14/18 0409 10/15/18 0405 10/16/18 0719 10/17/18 0745  NA 140 140 140 139 137 136  K 4.5 4.5 3.5 3.4* 3.5 4.0  CL 107 106 99 97* 94* 94*  CO2 20* 19* 26 28 26 24   GLUCOSE 88 87 107* 92 93 115*  BUN 106* 109* 104* 98* 95* 95*  CREATININE 12.63* 12.78* 11.82* 11.22* 10.81* 10.52*  CALCIUM 5.9*  6.1* 5.9* 5.7* 6.6* 7.0* 7.5*  MG 1.9  --   --   --   --   --   PHOS 7.1* 7.0* 6.1* 6.2* 5.9* 5.6*    GFR: Estimated Creatinine Clearance: 7.2 mL/min (A) (by C-G formula based on SCr of 10.52 mg/dL (H)).  Liver Function Tests: Recent Labs  Lab 10/13/18 0423 10/14/18 0409 10/15/18 0405 10/16/18 0719 10/17/18 0745  ALBUMIN 2.8* 2.6* 2.7* 2.8* 2.9*   No results for input(s): LIPASE, AMYLASE in the last 168 hours. Recent Labs  Lab 10/11/18 1132  AMMONIA 67    Coagulation Profile: No results for input(s): INR, PROTIME in the last 168 hours.  Cardiac Enzymes: No results for input(s): CKTOTAL, CKMB, CKMBINDEX, TROPONINI in the last 168 hours.   BNP (last 3 results) No results for input(s): PROBNP in the last 8760 hours.  HbA1C: No results for input(s): HGBA1C in the last 72 hours.  CBG: Recent Labs  Lab 10/12/18 1631  GLUCAP 104*    Lipid Profile: No results for input(s): CHOL, HDL, LDLCALC, TRIG, CHOLHDL, LDLDIRECT in the last 72 hours.  Thyroid Function Tests: No results for input(s): TSH, T4TOTAL, FREET4, T3FREE, THYROIDAB in the last 72 hours.  Anemia Panel: No results for input(s): VITAMINB12, FOLATE, FERRITIN, TIBC, IRON, RETICCTPCT in the last 72 hours.  Urine analysis:    Component Value Date/Time   COLORURINE YELLOW 10/12/2018 1917   APPEARANCEUR CLOUDY (A) 10/12/2018 1917   LABSPEC 1.005 10/12/2018 1917  PHURINE 6.0 10/12/2018 [place] 10/12/2018 1917   HGBUR MODERATE (A) 10/12/2018 [place] 10/12/2018 1917   BILIRUBINUR negative 04/26/2018 [place] 10/12/2018 1917   PROTEINUR 100 (A) 10/12/2018 1917   UROBILINOGEN 0.2 04/26/2018 1049   NITRITE NEGATIVE 10/12/2018 1917   LEUKOCYTESUR LARGE (A) 10/12/2018 1917    Sepsis Labs: Lactic Acid, Venous    Component Value Date/Time   LATICACIDVEN 1.1 05/03/2018 1623    MICROBIOLOGY: Recent Results (from the past 240 hour(s))  SARS CORONAVIRUS 2 (TAT 6-24 HRS) Nasopharyngeal Nasopharyngeal Swab     Status: None   Collection Time: 10/12/18  5:17 PM   Specimen: Nasopharyngeal Swab  Result Value Ref Range Status   SARS Coronavirus 2 NEGATIVE NEGATIVE Final    Comment: (NOTE) SARS-CoV-2 target nucleic acids are NOT DETECTED. The SARS-CoV-2 RNA is generally detectable in upper and lower respiratory specimens during the acute phase of infection. Negative results do not preclude SARS-CoV-2 infection, do not rule out co-infections with other pathogens, and should not be used as the sole basis for treatment or other patient management decisions. Negative results must be combined with clinical  observations, patient history, and epidemiological information. The expected result is Negative. Fact Sheet for Patients: SugarRoll.be Fact Sheet for Healthcare Providers: https://www.woods-mathews.com/ This test is not yet approved or cleared by the [place] FDA and  has been authorized for detection and/or diagnosis of SARS-CoV-2 by FDA under an Emergency Use Authorization (EUA). This EUA will remain  in effect (meaning this test can be used) for the duration of the COVID-19 declaration under Section 56 4(b)(1) of the Act, 21 U.S.C. section 360bbb-3(b)(1), unless the authorization is terminated or revoked sooner. Performed at [facility], [place] [address] [place], [place] [address]   Urine culture     Status: Abnormal   Collection Time: 10/12/18  5:19 PM   Specimen: Urine, Random  Result Value Ref Range Status   Specimen Description URINE, RANDOM  Final   Special Requests   Final    NONE Performed at [facility], [place] [address] [place], [place] [address]    Culture >=100,000 COLONIES/mL STAPHYLOCOCCUS EPIDERMIDIS (A)  Final   Report Status 10/14/2018 FINAL  Final   Organism ID, Bacteria STAPHYLOCOCCUS EPIDERMIDIS (A)  Final      Susceptibility   Staphylococcus epidermidis - MIC*    CIPROFLOXACIN >=8 RESISTANT Resistant     GENTAMICIN <=0.5 SENSITIVE Sensitive     NITROFURANTOIN <=16 SENSITIVE Sensitive     OXACILLIN <=0.25 SENSITIVE Sensitive     TETRACYCLINE <=1 SENSITIVE Sensitive     VANCOMYCIN 2 SENSITIVE Sensitive     TRIMETH/SULFA 160 RESISTANT Resistant     CLINDAMYCIN <=0.25 SENSITIVE Sensitive     RIFAMPIN <=0.5 SENSITIVE Sensitive     Inducible Clindamycin NEGATIVE Sensitive     * >=100,000 COLONIES/mL STAPHYLOCOCCUS EPIDERMIDIS    RADIOLOGY STUDIES/RESULTS: Ct Renal Stone Study  Result Date: 10/12/2018 CLINICAL DATA:  Previous history of hydronephrosis. EXAM: CT ABDOMEN AND PELVIS WITHOUT CONTRAST  TECHNIQUE: Multidetector CT imaging of the abdomen and pelvis was performed following the standard protocol without IV contrast. COMPARISON:  Ultrasound 05/04/2018 FINDINGS: Lower chest: Mild patchy scarring or atelectasis at both lung bases. No pleural effusion. Hepatobiliary: Normal without contrast. Pancreas: Normal Spleen: Normal Adrenals/Urinary Tract: Adrenal glands are normal. There is bilateral hydronephrosis at the level of the kidneys and renal pelves. I cannot clearly identify dilated ureters. Catheter is  present in the bladder. I do not see evidence of stone disease. Stomach/Bowel: Chronic diverticulosis of the colon without evidence of acute diverticulitis. No acute bowel pathology is seen. Vascular/Lymphatic: No aortic calcification or aortic aneurysm. IVC is normal. No retroperitoneal adenopathy. Reproductive: Enlarged prostate. Other: No free fluid or air. Musculoskeletal: Ordinary lumbar degenerative disc disease and degenerative facet disease. IMPRESSION: Chronic fullness of the renal collecting systems and renal pelves. I cannot clearly identify dilated ureters. No evidence of stone disease. Foley catheter in the bladder. Enlarged prostate. The findings could relate to chronic UPJ stenoses or could be sequela of chronic bladder outlet obstruction relieved by the Foley catheter. Electronically Signed   By: Nelson Chimes M.D.   On: 10/12/2018 20:53   Vas [place] Upper Ext Vein Mapping (pre-op Avf)  Result Date: 10/15/2018 UPPER EXTREMITY VEIN MAPPING  Indications: Pre-access. Comparison Study: No prior study Performing Technologist: Maudry Mayhew MHA, RDMS, RVT, RDCS  Examination Guidelines: A complete evaluation includes B-mode imaging, spectral Doppler, color Doppler, and power Doppler as needed of all accessible portions of each vessel. Bilateral testing is considered an integral part of a complete examination. Limited examinations for reoccurring indications may be performed as noted.  +-----------------+-------------+----------+--------------+ Right Cephalic   Diameter (cm)Depth (cm)   Findings    +-----------------+-------------+----------+--------------+ Shoulder                                not visualized +-----------------+-------------+----------+--------------+ Prox upper arm                          not visualized +-----------------+-------------+----------+--------------+ Mid upper arm                           not visualized +-----------------+-------------+----------+--------------+ Dist upper arm                          not visualized +-----------------+-------------+----------+--------------+ Antecubital fossa                       not visualized +-----------------+-------------+----------+--------------+ Prox forearm                            not visualized +-----------------+-------------+----------+--------------+ Mid forearm                             not visualized +-----------------+-------------+----------+--------------+ Dist forearm                            not visualized +-----------------+-------------+----------+--------------+ Wrist                                   not visualized +-----------------+-------------+----------+--------------+ +-----------------+-------------+----------+--------------+ Right Basilic    Diameter (cm)Depth (cm)   Findings    +-----------------+-------------+----------+--------------+ Prox upper arm       0.41                              +-----------------+-------------+----------+--------------+ Mid upper arm        0.30                              +-----------------+-------------+----------+--------------+  Dist upper arm       0.24                              +-----------------+-------------+----------+--------------+ Antecubital fossa    0.30                  Thrombus    +-----------------+-------------+----------+--------------+ Prox forearm         0.12                               +-----------------+-------------+----------+--------------+ Mid forearm                             not visualized +-----------------+-------------+----------+--------------+ Distal forearm                          not visualized +-----------------+-------------+----------+--------------+ Wrist                                   not visualized +-----------------+-------------+----------+--------------+ +-----------------+-------------+----------+---------+ Left Cephalic    Diameter (cm)Depth (cm)Findings  +-----------------+-------------+----------+---------+ Shoulder             0.12        0.32             +-----------------+-------------+----------+---------+ Prox upper arm       0.08        0.22             +-----------------+-------------+----------+---------+ Mid upper arm        0.10        0.17             +-----------------+-------------+----------+---------+ Dist upper arm       0.10        0.20             +-----------------+-------------+----------+---------+ Antecubital fossa    0.23        0.24   branching +-----------------+-------------+----------+---------+ Prox forearm         0.18        0.26   Thrombus  +-----------------+-------------+----------+---------+ Mid forearm          0.13        0.20             +-----------------+-------------+----------+---------+ Dist forearm         0.17        0.18             +-----------------+-------------+----------+---------+ +-----------------+-------------+----------+--------------+ Left Basilic     Diameter (cm)Depth (cm)   Findings    +-----------------+-------------+----------+--------------+ Prox upper arm       0.28                              +-----------------+-------------+----------+--------------+ Mid upper arm        0.33                              +-----------------+-------------+----------+--------------+ Dist upper arm       0.27                               +-----------------+-------------+----------+--------------+ Antecubital fossa  0.36                              +-----------------+-------------+----------+--------------+ Prox forearm         0.14                 branching    +-----------------+-------------+----------+--------------+ Mid forearm                             not visualized +-----------------+-------------+----------+--------------+ Distal forearm                          not visualized +-----------------+-------------+----------+--------------+ Wrist                                   not visualized +-----------------+-------------+----------+--------------+ *See table(s) above for measurements and observations.  Diagnosing physician:    Preliminary      LOS: 5 days   Oren Binet, MD  [facility]  If 7PM-7AM, please contact night-coverage  Please page via [email or web address]  Go to [email or web address] and use [place]'s universal password to access. If you do not have the password, please contact the hospital operator.  Locate the [facility] provider you are looking for under [facility] and page to a number that you can be directly reached. If you still have difficulty reaching the provider, please page the [facility] (Director on Call) for the Hospitalists listed on amion for assistance.  10/17/2018, 10:26 AM

## 2018-10-18 ENCOUNTER — Inpatient Hospital Stay (HOSPITAL_COMMUNITY): Payer: Medicare Other

## 2018-10-18 ENCOUNTER — Encounter (HOSPITAL_COMMUNITY)
Admission: EM | Disposition: A | Discharge status: Home Health | Payer: Self-pay | Source: Home / Self Care | Attending: Internal Medicine

## 2018-10-18 ENCOUNTER — Inpatient Hospital Stay (HOSPITAL_COMMUNITY): Payer: Medicare Other | Admitting: Certified Registered"

## 2018-10-18 ENCOUNTER — Encounter (HOSPITAL_COMMUNITY): Payer: Self-pay | Admitting: Surgery

## 2018-10-18 HISTORY — PX: AV FISTULA PLACEMENT: SHX1204

## 2018-10-18 HISTORY — PX: INSERTION OF DIALYSIS CATHETER: SHX1324

## 2018-10-18 LAB — RENAL FUNCTION PANEL
Albumin: 2.9 g/dL — ABNORMAL LOW (ref 3.5–5.0)
Anion gap: 16 — ABNORMAL HIGH (ref 5–15)
BUN: 99 mg/dL — ABNORMAL HIGH (ref 8–23)
CO2: 26 mmol/L (ref 22–32)
Calcium: 7.9 mg/dL — ABNORMAL LOW (ref 8.9–10.3)
Chloride: 94 mmol/L — ABNORMAL LOW (ref 98–111)
Creatinine, Ser: 10.66 mg/dL — ABNORMAL HIGH (ref 0.61–1.24)
GFR calc Af Amer: 5 mL/min — ABNORMAL LOW (ref >60)
GFR calc non Af Amer: 4 mL/min — ABNORMAL LOW (ref >60)
Glucose, Bld: 100 mg/dL — ABNORMAL HIGH (ref 70–99)
Phosphorus: 5.6 mg/dL — ABNORMAL HIGH (ref 2.5–4.6)
Potassium: 4.3 mmol/L (ref 3.5–5.1)
Sodium: 136 mmol/L (ref 135–145)

## 2018-10-18 LAB — GLUCOSE, CAPILLARY
Glucose-Capillary: 86 mg/dL (ref 70–99)
Glucose-Capillary: 104 mg/dL — ABNORMAL HIGH (ref 70–99)

## 2018-10-18 LAB — SURGICAL PCR SCREEN
MRSA, PCR: NEGATIVE
Staphylococcus aureus: NEGATIVE

## 2018-10-18 LAB — CBC
HCT: 26.2 % — ABNORMAL LOW (ref 39.0–52.0)
Hemoglobin: 8.6 g/dL — ABNORMAL LOW (ref 13.0–17.0)
MCH: 30.9 pg (ref 26.0–34.0)
MCHC: 32.8 g/dL (ref 30.0–36.0)
MCV: 94.2 fL (ref 80.0–100.0)
Platelets: 299 10*3/uL (ref 150–400)
RBC: 2.78 MIL/uL — ABNORMAL LOW (ref 4.22–5.81)
RDW: 13.6 % (ref 11.5–15.5)
WBC: 6.9 10*3/uL (ref 4.0–10.5)
nRBC: 0 % (ref 0.0–0.2)

## 2018-10-18 SURGERY — ARTERIOVENOUS (AV) FISTULA CREATION
Anesthesia: Monitor Anesthesia Care | Site: Chest | Laterality: Right

## 2018-10-18 MED ORDER — FENTANYL CITRATE (PF) 250 MCG/5ML IJ SOLN
INTRAMUSCULAR | Status: DC | PRN
  Administered 2018-10-18 (×2): 25 ug via INTRAVENOUS
  Administered 2018-10-18: 100 ug via INTRAVENOUS

## 2018-10-18 MED ORDER — PHENYLEPHRINE 40 MCG/ML (10ML) SYRINGE FOR IV PUSH (FOR BLOOD PRESSURE SUPPORT)
PREFILLED_SYRINGE | INTRAVENOUS | Status: AC
  Filled 2018-10-18: qty 10

## 2018-10-18 MED ORDER — PROPOFOL 500 MG/50ML IV EMUL
INTRAVENOUS | Status: DC | PRN
  Administered 2018-10-18: 50 ug/kg/min via INTRAVENOUS

## 2018-10-18 MED ORDER — SODIUM CHLORIDE 0.9 % IV SOLN
INTRAVENOUS | Status: DC | PRN
  Administered 2018-10-18: 500 mL

## 2018-10-18 MED ORDER — MIDAZOLAM HCL 2 MG/2ML IJ SOLN
INTRAMUSCULAR | Status: AC
  Filled 2018-10-18: qty 2

## 2018-10-18 MED ORDER — HEPARIN SODIUM (PORCINE) 1000 UNIT/ML IJ SOLN
INTRAMUSCULAR | Status: DC | PRN
  Administered 2018-10-18: 7000 [IU] via INTRAVENOUS

## 2018-10-18 MED ORDER — FENTANYL CITRATE (PF) 250 MCG/5ML IJ SOLN
INTRAMUSCULAR | Status: AC
  Filled 2018-10-18: qty 5

## 2018-10-18 MED ORDER — ONDANSETRON HCL 4 MG/2ML IJ SOLN
INTRAMUSCULAR | Status: AC
  Filled 2018-10-18: qty 2

## 2018-10-18 MED ORDER — HEPARIN SODIUM (PORCINE) 1000 UNIT/ML IJ SOLN
INTRAMUSCULAR | Status: DC | PRN
  Administered 2018-10-18: 3.4 [IU] via INTRAVENOUS

## 2018-10-18 MED ORDER — CEFAZOLIN SODIUM 1 G IJ SOLR
INTRAMUSCULAR | Status: AC
  Filled 2018-10-18: qty 20

## 2018-10-18 MED ORDER — SODIUM CHLORIDE 0.9 % IV SOLN
INTRAVENOUS | Status: DC
  Administered 2018-10-18 (×2): via INTRAVENOUS

## 2018-10-18 MED ORDER — LIDOCAINE 2% (20 MG/ML) 5 ML SYRINGE
INTRAMUSCULAR | Status: AC
  Filled 2018-10-18: qty 5

## 2018-10-18 MED ORDER — LIDOCAINE-EPINEPHRINE (PF) 1 %-1:200000 IJ SOLN
INTRAMUSCULAR | Status: DC | PRN
  Administered 2018-10-18: 22 mL

## 2018-10-18 MED ORDER — 0.9 % SODIUM CHLORIDE (POUR BTL) OPTIME
TOPICAL | Status: DC | PRN
  Administered 2018-10-18: 1000 mL

## 2018-10-18 MED ORDER — LIDOCAINE HCL (PF) 1 % IJ SOLN: INTRAMUSCULAR | Status: DC | PRN

## 2018-10-18 MED ORDER — PROPOFOL 10 MG/ML IV BOLUS
INTRAVENOUS | Status: AC
  Filled 2018-10-18: qty 20

## 2018-10-18 MED ORDER — OXYCODONE-ACETAMINOPHEN 5-325 MG PO TABS
1.0000 | ORAL_TABLET | Freq: Four times a day (QID) | ORAL | Status: DC | PRN
  Administered 2018-10-18: 1 via ORAL
  Filled 2018-10-18: qty 1

## 2018-10-18 MED ORDER — SODIUM CHLORIDE 0.9 % IV SOLN
INTRAVENOUS | Status: AC
  Filled 2018-10-18: qty 1.2

## 2018-10-18 MED ORDER — PROTAMINE SULFATE 10 MG/ML IV SOLN
INTRAVENOUS | Status: DC | PRN
  Administered 2018-10-18: 40 mg via INTRAVENOUS

## 2018-10-18 MED ORDER — SUCCINYLCHOLINE CHLORIDE 200 MG/10ML IV SOSY
PREFILLED_SYRINGE | INTRAVENOUS | Status: AC
  Filled 2018-10-18: qty 10

## 2018-10-18 MED ORDER — ONDANSETRON HCL 4 MG/2ML IJ SOLN
INTRAMUSCULAR | Status: DC | PRN
  Administered 2018-10-18: 4 mg via INTRAVENOUS

## 2018-10-18 MED ORDER — LIDOCAINE-EPINEPHRINE (PF) 1 %-1:200000 IJ SOLN
INTRAMUSCULAR | Status: AC
  Filled 2018-10-18: qty 30

## 2018-10-18 MED ORDER — HEPARIN SODIUM (PORCINE) 1000 UNIT/ML IJ SOLN
INTRAMUSCULAR | Status: AC
  Filled 2018-10-18: qty 1

## 2018-10-18 MED ORDER — HEPARIN SODIUM (PORCINE) 1000 UNIT/ML IJ SOLN
INTRAMUSCULAR | Status: AC
  Filled 2018-10-18: qty 4

## 2018-10-18 MED ORDER — MIDAZOLAM HCL 2 MG/2ML IJ SOLN
INTRAMUSCULAR | Status: DC | PRN
  Administered 2018-10-18: 2 mg via INTRAVENOUS

## 2018-10-18 SURGICAL SUPPLY — 60 items
ADH SKN CLS APL DERMABOND .7 (GAUZE/BANDAGES/DRESSINGS) ×4
APL PRP STRL LF DISP 70% ISPRP (MISCELLANEOUS) ×2
ARMBAND PINK RESTRICT EXTREMIT (MISCELLANEOUS) ×6 IMPLANT
BAG DECANTER FOR FLEXI CONT (MISCELLANEOUS) ×3 IMPLANT
BIOPATCH RED 1 DISK 7.0 (GAUZE/BANDAGES/DRESSINGS) ×3 IMPLANT
CANISTER SUCT 3000ML PPV (MISCELLANEOUS) ×3 IMPLANT
CANNULA VESSEL 3MM 2 BLNT TIP (CANNULA) ×3 IMPLANT
CATH PALINDROME RT-P 15FX19CM (CATHETERS) IMPLANT
CATH PALINDROME RT-P 15FX23CM (CATHETERS) ×1 IMPLANT
CATH PALINDROME RT-P 15FX28CM (CATHETERS) IMPLANT
CATH PALINDROME RT-P 15FX55CM (CATHETERS) IMPLANT
CHLORAPREP W/TINT 26 (MISCELLANEOUS) ×3 IMPLANT
CLIP VESOCCLUDE MED 6/CT (CLIP) ×3 IMPLANT
CLIP VESOCCLUDE SM WIDE 6/CT (CLIP) ×4 IMPLANT
COVER PROBE W GEL 5X96 (DRAPES) IMPLANT
COVER SURGICAL LIGHT HANDLE (MISCELLANEOUS) ×3 IMPLANT
COVER WAND RF STERILE (DRAPES) ×3 IMPLANT
DECANTER SPIKE VIAL GLASS SM (MISCELLANEOUS) ×3 IMPLANT
DERMABOND ADVANCED (GAUZE/BANDAGES/DRESSINGS) ×2
DERMABOND ADVANCED .7 DNX12 (GAUZE/BANDAGES/DRESSINGS) ×2 IMPLANT
DRAPE C-ARM 42X72 X-RAY (DRAPES) ×3 IMPLANT
DRAPE CHEST BREAST 15X10 FENES (DRAPES) ×3 IMPLANT
ELECT REM PT RETURN 9FT ADLT (ELECTROSURGICAL) ×3
ELECTRODE REM PT RTRN 9FT ADLT (ELECTROSURGICAL) ×2 IMPLANT
GAUZE 4X4 16PLY RFD (DISPOSABLE) ×3 IMPLANT
GLOVE BIO SURGEON STRL SZ 6.5 (GLOVE) ×3 IMPLANT
GLOVE BIO SURGEON STRL SZ7.5 (GLOVE) ×3 IMPLANT
GLOVE BIOGEL PI IND STRL 6.5 (GLOVE) IMPLANT
GLOVE BIOGEL PI IND STRL 7.0 (GLOVE) IMPLANT
GLOVE BIOGEL PI IND STRL 8 (GLOVE) ×2 IMPLANT
GLOVE BIOGEL PI INDICATOR 6.5 (GLOVE) ×1
GLOVE BIOGEL PI INDICATOR 7.0 (GLOVE) ×5
GLOVE BIOGEL PI INDICATOR 8 (GLOVE) ×1
GLOVE ECLIPSE 6.5 STRL STRAW (GLOVE) ×1 IMPLANT
GOWN STRL REUS W/ TWL LRG LVL3 (GOWN DISPOSABLE) ×6 IMPLANT
GOWN STRL REUS W/TWL LRG LVL3 (GOWN DISPOSABLE) ×9
KIT BASIN OR (CUSTOM PROCEDURE TRAY) ×3 IMPLANT
KIT TURNOVER KIT B (KITS) ×3 IMPLANT
NDL 18GX1X1/2 (RX/OR ONLY) (NEEDLE) ×2 IMPLANT
NDL HYPO 25GX1X1/2 BEV (NEEDLE) ×2 IMPLANT
NEEDLE 18GX1X1/2 (RX/OR ONLY) (NEEDLE) ×3 IMPLANT
NEEDLE HYPO 25GX1X1/2 BEV (NEEDLE) ×3 IMPLANT
NS IRRIG 1000ML POUR BTL (IV SOLUTION) ×3 IMPLANT
PACK CV ACCESS (CUSTOM PROCEDURE TRAY) ×3 IMPLANT
PACK SURGICAL SETUP 50X90 (CUSTOM PROCEDURE TRAY) ×3 IMPLANT
PAD ARMBOARD 7.5X6 YLW CONV (MISCELLANEOUS) ×6 IMPLANT
SPONGE SURGIFOAM ABS GEL 100 (HEMOSTASIS) IMPLANT
SUT ETHILON 3 0 PS 1 (SUTURE) ×3 IMPLANT
SUT PROLENE 6 0 BV (SUTURE) ×4 IMPLANT
SUT VIC AB 3-0 SH 27 (SUTURE) ×3
SUT VIC AB 3-0 SH 27X BRD (SUTURE) ×2 IMPLANT
SUT VICRYL 4-0 PS2 18IN ABS (SUTURE) ×3 IMPLANT
SYR 10ML LL (SYRINGE) ×3 IMPLANT
SYR 20ML LL LF (SYRINGE) ×6 IMPLANT
SYR 5ML LL (SYRINGE) ×6 IMPLANT
SYR CONTROL 10ML LL (SYRINGE) ×3 IMPLANT
TOWEL GREEN STERILE (TOWEL DISPOSABLE) ×6 IMPLANT
TOWEL GREEN STERILE FF (TOWEL DISPOSABLE) ×3 IMPLANT
UNDERPAD 30X30 (UNDERPADS AND DIAPERS) ×3 IMPLANT
WATER STERILE IRR 1000ML POUR (IV SOLUTION) ×3 IMPLANT

## 2018-10-18 NOTE — Anesthesia Preprocedure Evaluation (Addendum)
Anesthesia Evaluation  Patient identified by MRN, date of birth, ID band Patient awake    Reviewed: Allergy & Precautions, NPO status , Patient's Chart, lab work & pertinent test results  Airway Mallampati: II  TM Distance: >3 FB Neck ROM: Full    Dental  (+) Chipped, Missing, Dental Advisory Given   Pulmonary former smoker (Quit 1999),  10/12/2018 SARS coronavirus NEG   breath sounds clear to auscultation       Cardiovascular hypertension (no meds),  Rhythm:Regular Rate:Normal  04/2018 ECHO: EF 60-65%, Moderate thickening of the aortic valve. Moderate calcification of the aortic valve. Aortic valve regurgitation is mild to moderate, mild TR   Neuro/Psych negative neurological ROS     GI/Hepatic negative GI ROS, (+)     substance abuse  alcohol use and marijuana use,   Endo/Other  diabetes (glu 86, diet controlled)  Renal/GU ESRFRenal disease (K+ 4.3, not on dialysis yet)     Musculoskeletal   Abdominal   Peds  Hematology  (+) Blood dyscrasia (Hb 8.6), anemia ,   Anesthesia Other Findings   Reproductive/Obstetrics                            Anesthesia Physical Anesthesia Plan  ASA: III  Anesthesia Plan: MAC   Post-op Pain Management:    Induction:   PONV Risk Score and Plan: 1 and Ondansetron  Airway Management Planned: Natural Airway and Nasal Cannula  Additional Equipment:   Intra-op Plan:   Post-operative Plan:   Informed Consent: I have reviewed the patients History and Physical, chart, labs and discussed the procedure including the risks, benefits and alternatives for the proposed anesthesia with the patient or authorized representative who has indicated his/her understanding and acceptance.     Dental advisory given  Plan Discussed with: CRNA and Surgeon  Anesthesia Plan Comments:        Anesthesia Quick Evaluation

## 2018-10-18 NOTE — Progress Notes (Signed)
PT Cancellation Note  Patient Details Name: Matthew [name] MRN: [id number] DOB: 1950-02-10   Cancelled Treatment:    Reason Eval/Treat Not Completed: Patient at procedure or test/unavailable   Will follow up later today as time allows;  Otherwise, will follow up for PT tomorrow;   Thank you,  Roney Marion, PT  Acute Rehabilitation Services Pager [phone] Office [phone]     [place] 10/18/2018, 8:14 AM

## 2018-10-18 NOTE — Interval H&P Note (Signed)
History and Physical Interval Note:  10/18/2018 9:29 AM  Matthew [name]  has presented today for surgery, with the diagnosis of end stage renal disease.  The various methods of treatment have been discussed with the patient and family. After consideration of risks, benefits and other options for treatment, the patient has consented to  Procedure(s): ARTERIOVENOUS (AV) FISTULA CREATION LEFT ARM (Left) INSERTION OF DIALYSIS CATHETER (N/A) as a surgical intervention.  The patient's history has been reviewed, patient examined, no change in status, stable for surgery.  I have reviewed the patient's chart and labs.  Questions were answered to the patient's satisfaction.     Deitra Mayo

## 2018-10-18 NOTE — Anesthesia Postprocedure Evaluation (Signed)
Anesthesia Post Note  Patient: Matthew [name]  Procedure(s) Performed: First Stage Basilic Transposition. (Left Arm Upper) INSERTION OF Right Internal Jugular DIALYSIS CATHETER (Right Chest)     Patient location during evaluation: PACU Anesthesia Type: MAC Level of consciousness: awake and alert, patient cooperative and oriented Pain management: pain level controlled Vital Signs Assessment: post-procedure vital signs reviewed and stable Respiratory status: spontaneous breathing, nonlabored ventilation and respiratory function stable Cardiovascular status: blood pressure returned to baseline and stable Postop Assessment: no apparent nausea or vomiting Anesthetic complications: no    Last Vitals:  Vitals:   10/18/18 1430 10/18/18 1500  BP: (!) 152/80 (!) 170/105  Pulse: 71 73  Resp:  12  Temp:    SpO2:      Last Pain:  Vitals:   10/18/18 1345  TempSrc: Oral  PainSc: 0-No pain                 Darek Eifler,E. Drake Wuertz

## 2018-10-18 NOTE — Progress Notes (Signed)
PROGRESS NOTE        PATIENT DETAILS Name: Matthew [name] Age: 68 y.o. Sex: male Date of Birth: 03-Nov-1950 Admit Date: 10/12/2018 Admitting Physician Hosie Poisson, MD [id number], Ranell [place], MD  Brief Narrative: Patient is a 68 y.o. male with history of CKD stage V secondary to obstructive uropathy, hypertension-sent to the ED by primary care practitioner for worsening renal failure-now thought to have likely ESRD.  See below for further details.  Subjective: Seen in HD-patient had ultrasound-guided right IJ tunneled dialysis catheter-and first stage of basilar vein transposition placed earlier today.  Assessment/Plan: AKI on CKD stage V-with progression to ESRD: Nephrology following-HD started on 9/21.  Underwent right IJ tunneled dialysis catheter placement and basic vein transposition   History of chronic obstructive uropathy with acute urinary retention on admission: CT renal stone study-did not show any obvious hydronephrosis-Foley catheter in place (had 1 L of urine output when Foley was placed)  Complicated UTI: UA grossly abnormal-had urinary retention admission as well-continue Ancef-last dose on 9/21.  Urine culture positive for staph epidermidis.  Anemia: Suspect secondary to CKD-no evidence of blood loss-patient is s/p 1 unit of PRBC on 9/15.  Remains on darbepoetin.  Hemoglobin slowly improving.  Follow.   Metabolic acidosis: Secondary to AKI-improved with bicarb supplementation  Hypocalcemia: Probably secondary to CKD-improving with calcitriol and calcium carbonate.  HTN: BP controlled-resume antihypertensives if blood pressure remains persistently elevated-HD started today- should help.  Alcohol abuse: Last drink 2 days prior to this hospital stay-no signs of alcohol withdrawal.  Diet: Diet Order            Diet renal with fluid restriction Fluid restriction: 1200 mL Fluid; Room service appropriate? Yes; Fluid consistency: Thin  Diet  effective now               DVT Prophylaxis: Prophylactic Heparin   Code Status: Full code   Family Communication: None at bedside  Disposition Plan: Remain inpatient  Barrier to discharge: Significantly worsened renal function with severe metabolic acidosis, urinary retention and UTI- needs initiation of HD during this hospital stay-if no improvement.  Antimicrobial agents: Anti-infectives (From admission, onward)   Start     Dose/Rate Route Frequency Ordered Stop   10/14/18 2200  ceFAZolin (ANCEF) IVPB 1 g/50 mL premix     1 g 100 mL/hr over 30 Minutes Intravenous Every 24 hours 10/14/18 1435     10/13/18 0000  cefTRIAXone (ROCEPHIN) 1 g in sodium chloride 0.9 % 100 mL IVPB  Status:  Discontinued     1 g 200 mL/hr over 30 Minutes Intravenous Every 24 hours 10/12/18 1926 10/14/18 1435      Procedures: None  CONSULTS:  nephrology  Vascular surgery  Time spent: 25 minutes-Greater than 50% of this time was spent in counseling, explanation of diagnosis, planning of further management, and coordination of care.  MEDICATIONS: Scheduled Meds:  calcitRIOL  0.5 mcg Oral Daily   calcium carbonate  2 tablet Oral TID   Chlorhexidine Gluconate Cloth  6 each Topical Q0600   darbepoetin (ARANESP) injection - NON-DIALYSIS  100 mcg Subcutaneous Q [id number]   folic acid  1 mg Oral Daily   heparin  5,000 Units Subcutaneous Q8H   multivitamin with minerals  1 tablet Oral Daily   nicotine  14 mg Transdermal Daily   sodium chloride flush  3 mL Intravenous  Once   thiamine  100 mg Oral Daily   Continuous Infusions:  sodium chloride Stopped (10/18/18 1154)   sodium chloride     sodium chloride 10 mL/hr at 10/18/18 0900    ceFAZolin (ANCEF) IV 1 g (10/17/18 2124)   PRN Meds:.sodium chloride, sodium chloride, acetaminophen, alteplase, heparin, hydrALAZINE, lidocaine (PF), lidocaine-prilocaine, morphine injection, ondansetron (ZOFRAN) IV, oxyCODONE-acetaminophen,  pentafluoroprop-tetrafluoroeth   PHYSICAL EXAM: Vital signs: Vitals:   10/18/18 1345 10/18/18 1355 10/18/18 1400 10/18/18 1430  BP: (!) 161/89 (!) 162/88 (!) 145/81 (!) 152/80  Pulse: 71 70 68 71  Resp: 15 16    Temp: 98.2 F (36.8 C)     TempSrc: Oral     SpO2: 96%     Weight: 79.5 kg     Height:       Filed Weights   10/17/18 0419 10/18/18 0348 10/18/18 1345  Weight: 76.7 kg 75.7 kg 79.5 kg   Body mass index is 24.79 kg/m.   Gen Exam:Alert awake-not in any distress HEENT:atraumatic, normocephalic Chest: B/L clear to auscultation anteriorly CVS:S1S2 regular Abdomen:soft non tender, non distended Extremities:no edema Neurology: Non focal Skin: no rash  I have personally reviewed following labs and imaging studies  LABORATORY DATA: CBC: Recent Labs  Lab 10/14/18 0409 10/15/18 0405 10/16/18 0719 10/17/18 0745 10/18/18 0541  WBC 6.6 6.0 6.4 7.1 6.9  HGB 7.3* 8.1* 8.5* 8.6* 8.6*  HCT 21.5* 24.5* 25.8* 26.5* 26.2*  MCV 90.7 91.8 92.5 94.6 94.2  PLT 261 300 296 299 [id number]    Basic Metabolic Panel: Recent Labs  Lab 10/13/18 0422  10/14/18 0409 10/15/18 0405 10/16/18 0719 10/17/18 0745 10/18/18 0541  NA 140   < > 140 139 137 136 136  K 4.5   < > 3.5 3.4* 3.5 4.0 4.3  CL 107   < > 99 97* 94* 94* 94*  CO2 20*   < > 26 28 26 24 26   GLUCOSE 88   < > 107* 92 93 115* 100*  BUN 106*   < > 104* 98* 95* 95* 99*  CREATININE 12.63*   < > 11.82* 11.22* 10.81* 10.52* 10.66*  CALCIUM 5.9*   6.1*   < > 5.7* 6.6* 7.0* 7.5* 7.9*  MG 1.9  --   --   --   --   --   --   PHOS 7.1*   < > 6.1* 6.2* 5.9* 5.6* 5.6*   < > = values in this interval not displayed.    GFR: Estimated Creatinine Clearance: 7.1 mL/min (A) (by C-G formula based on SCr of 10.66 mg/dL (H)).  Liver Function Tests: Recent Labs  Lab 10/14/18 0409 10/15/18 0405 10/16/18 0719 10/17/18 0745 10/18/18 0541  ALBUMIN 2.6* 2.7* 2.8* 2.9* 2.9*   No results for input(s): LIPASE, AMYLASE in the last 168  hours. No results for input(s): AMMONIA in the last 168 hours.  Coagulation Profile: No results for input(s): INR, PROTIME in the last 168 hours.  Cardiac Enzymes: No results for input(s): CKTOTAL, CKMB, CKMBINDEX, TROPONINI in the last 168 hours.  BNP (last 3 results) No results for input(s): PROBNP in the last 8760 hours.  HbA1C: No results for input(s): HGBA1C in the last 72 hours.  CBG: Recent Labs  Lab 10/12/18 1631 10/18/18 0738 10/18/18 1201  GLUCAP 104* 86 104*    Lipid Profile: No results for input(s): CHOL, HDL, LDLCALC, TRIG, CHOLHDL, LDLDIRECT in the last 72 hours.  Thyroid Function Tests: No results for input(s): TSH, T4TOTAL,  FREET4, T3FREE, THYROIDAB in the last 72 hours.  Anemia Panel: No results for input(s): VITAMINB12, FOLATE, FERRITIN, TIBC, IRON, RETICCTPCT in the last 72 hours.  Urine analysis:    Component Value Date/Time   COLORURINE YELLOW 10/12/2018 1917   APPEARANCEUR CLOUDY (A) 10/12/2018 1917   LABSPEC 1.005 10/12/2018 1917   PHURINE 6.0 10/12/2018 1917   GLUCOSEU NEGATIVE 10/12/2018 1917   HGBUR MODERATE (A) 10/12/2018 1917   BILIRUBINUR NEGATIVE 10/12/2018 1917   BILIRUBINUR negative 04/26/2018 1049   KETONESUR NEGATIVE 10/12/2018 1917   PROTEINUR 100 (A) 10/12/2018 1917   UROBILINOGEN 0.2 04/26/2018 1049   NITRITE NEGATIVE 10/12/2018 1917   LEUKOCYTESUR LARGE (A) 10/12/2018 1917    Sepsis Labs: Lactic Acid, Venous    Component Value Date/Time   LATICACIDVEN 1.1 05/03/2018 1623    MICROBIOLOGY: Recent Results (from the past 240 hour(s))  SARS CORONAVIRUS 2 (TAT 6-24 HRS) Nasopharyngeal Nasopharyngeal Swab     Status: None   Collection Time: 10/12/18  5:17 PM   Specimen: Nasopharyngeal Swab  Result Value Ref Range Status   SARS Coronavirus 2 NEGATIVE NEGATIVE Final    Comment: (NOTE) SARS-CoV-2 target nucleic acids are NOT DETECTED. The SARS-CoV-2 RNA is generally detectable in upper and lower respiratory specimens  during the acute phase of infection. Negative results do not preclude SARS-CoV-2 infection, do not rule out co-infections with other pathogens, and should not be used as the sole basis for treatment or other patient management decisions. Negative results must be combined with clinical observations, patient history, and epidemiological information. The expected result is Negative. Fact Sheet for Patients: SugarRoll.be Fact Sheet for Healthcare Providers: https://www.woods-mathews.com/ This test is not yet approved or cleared by the [place] FDA and  has been authorized for detection and/or diagnosis of SARS-CoV-2 by FDA under an Emergency Use Authorization (EUA). This EUA will remain  in effect (meaning this test can be used) for the duration of the COVID-19 declaration under Section 56 4(b)(1) of the Act, 21 U.S.C. section 360bbb-3(b)(1), unless the authorization is terminated or revoked sooner. Performed at [facility], [place] [address], [place], [place] [address]   Urine culture     Status: Abnormal   Collection Time: 10/12/18  5:19 PM   Specimen: Urine, Random  Result Value Ref Range Status   Specimen Description URINE, RANDOM  Final   Special Requests   Final    NONE Performed at [facility], [place] [address], [place], [place] [address]    Culture >=100,000 COLONIES/mL STAPHYLOCOCCUS EPIDERMIDIS (A)  Final   Report Status 10/14/2018 FINAL  Final   Organism ID, Bacteria STAPHYLOCOCCUS EPIDERMIDIS (A)  Final      Susceptibility   Staphylococcus epidermidis - MIC*    CIPROFLOXACIN >=8 RESISTANT Resistant     GENTAMICIN <=0.5 SENSITIVE Sensitive     NITROFURANTOIN <=16 SENSITIVE Sensitive     OXACILLIN <=0.25 SENSITIVE Sensitive     TETRACYCLINE <=1 SENSITIVE Sensitive     VANCOMYCIN 2 SENSITIVE Sensitive     TRIMETH/SULFA 160 RESISTANT Resistant     CLINDAMYCIN <=0.25 SENSITIVE Sensitive     RIFAMPIN <=0.5 SENSITIVE  Sensitive     Inducible Clindamycin NEGATIVE Sensitive     * >=100,000 COLONIES/mL STAPHYLOCOCCUS EPIDERMIDIS  Surgical pcr screen     Status: None   Collection Time: 10/18/18 12:24 AM   Specimen: Nasal Mucosa; Nasal Swab  Result Value Ref Range Status   MRSA, PCR NEGATIVE NEGATIVE Final   Staphylococcus aureus NEGATIVE NEGATIVE  Final    Comment: (NOTE) The Xpert SA Assay (FDA approved for NASAL specimens in patients 50 years of age and older), is one component of a comprehensive surveillance program. It is not intended to diagnose infection nor to guide or monitor treatment. Performed at [facility], [place] [address], [place], [place] [address]     RADIOLOGY STUDIES/RESULTS: Dg Fluoro Guide Cv Line-no Report  Result Date: 10/18/2018 Fluoroscopy was utilized by the requesting physician.  No radiographic interpretation.   Ct Renal Stone Study  Result Date: 10/12/2018 CLINICAL DATA:  Previous history of hydronephrosis. EXAM: CT ABDOMEN AND PELVIS WITHOUT CONTRAST TECHNIQUE: Multidetector CT imaging of the abdomen and pelvis was performed following the standard protocol without IV contrast. COMPARISON:  Ultrasound 05/04/2018 FINDINGS: Lower chest: Mild patchy scarring or atelectasis at both lung bases. No pleural effusion. Hepatobiliary: Normal without contrast. Pancreas: Normal Spleen: Normal Adrenals/Urinary Tract: Adrenal glands are normal. There is bilateral hydronephrosis at the level of the kidneys and renal pelves. I cannot clearly identify dilated ureters. Catheter is present in the bladder. I do not see evidence of stone disease. Stomach/Bowel: Chronic diverticulosis of the colon without evidence of acute diverticulitis. No acute bowel pathology is seen. Vascular/Lymphatic: No aortic calcification or aortic aneurysm. IVC is normal. No retroperitoneal adenopathy. Reproductive: Enlarged prostate. Other: No free fluid or air. Musculoskeletal: Ordinary lumbar degenerative disc  disease and degenerative facet disease. IMPRESSION: Chronic fullness of the renal collecting systems and renal pelves. I cannot clearly identify dilated ureters. No evidence of stone disease. Foley catheter in the bladder. Enlarged prostate. The findings could relate to chronic UPJ stenoses or could be sequela of chronic bladder outlet obstruction relieved by the Foley catheter. Electronically Signed   By: Nelson Chimes M.D.   On: 10/12/2018 20:53   Vas [place] Upper Ext Vein Mapping (pre-op Avf)  Result Date: 10/17/2018 UPPER EXTREMITY VEIN MAPPING  Indications: Pre-access. Comparison Study: No prior study Performing Technologist: Maudry Mayhew MHA, RDMS, RVT, RDCS  Examination Guidelines: A complete evaluation includes B-mode imaging, spectral Doppler, color Doppler, and power Doppler as needed of all accessible portions of each vessel. Bilateral testing is considered an integral part of a complete examination. Limited examinations for reoccurring indications may be performed as noted. +-----------------+-------------+----------+--------------+  Right Cephalic    Diameter (cm) Depth (cm)    Findings     +-----------------+-------------+----------+--------------+  Shoulder                                   not visualized  +-----------------+-------------+----------+--------------+  Prox upper arm                             not visualized  +-----------------+-------------+----------+--------------+  Mid upper arm                              not visualized  +-----------------+-------------+----------+--------------+  Dist upper arm                             not visualized  +-----------------+-------------+----------+--------------+  Antecubital fossa                          not visualized  +-----------------+-------------+----------+--------------+  Prox forearm  not visualized  +-----------------+-------------+----------+--------------+  Mid forearm                                 not visualized  +-----------------+-------------+----------+--------------+  Dist forearm                               not visualized  +-----------------+-------------+----------+--------------+  Wrist                                      not visualized  +-----------------+-------------+----------+--------------+ +-----------------+-------------+----------+--------------+  Right Basilic     Diameter (cm) Depth (cm)    Findings     +-----------------+-------------+----------+--------------+  Prox upper arm        0.41                                 +-----------------+-------------+----------+--------------+  Mid upper arm         0.30                                 +-----------------+-------------+----------+--------------+  Dist upper arm        0.24                                 +-----------------+-------------+----------+--------------+  Antecubital fossa     0.30                    Thrombus     +-----------------+-------------+----------+--------------+  Prox forearm          0.12                                 +-----------------+-------------+----------+--------------+  Mid forearm                                not visualized  +-----------------+-------------+----------+--------------+  Distal forearm                             not visualized  +-----------------+-------------+----------+--------------+  Wrist                                      not visualized  +-----------------+-------------+----------+--------------+ +-----------------+-------------+----------+---------+  Left Cephalic     Diameter (cm) Depth (cm) Findings   +-----------------+-------------+----------+---------+  Shoulder              0.12         0.32               +-----------------+-------------+----------+---------+  Prox upper arm        0.08         0.22               +-----------------+-------------+----------+---------+  Mid upper arm         0.10         0.17                +-----------------+-------------+----------+---------+  Dist upper arm        0.10         0.20               +-----------------+-------------+----------+---------+  Antecubital fossa     0.23         0.24    branching  +-----------------+-------------+----------+---------+  Prox forearm          0.18         0.26    Thrombus   +-----------------+-------------+----------+---------+  Mid forearm           0.13         0.20               +-----------------+-------------+----------+---------+  Dist forearm          0.17         0.18               +-----------------+-------------+----------+---------+ +-----------------+-------------+----------+--------------+  Left Basilic      Diameter (cm) Depth (cm)    Findings     +-----------------+-------------+----------+--------------+  Prox upper arm        0.28                                 +-----------------+-------------+----------+--------------+  Mid upper arm         0.33                                 +-----------------+-------------+----------+--------------+  Dist upper arm        0.27                                 +-----------------+-------------+----------+--------------+  Antecubital fossa     0.36                                 +-----------------+-------------+----------+--------------+  Prox forearm          0.14                   branching     +-----------------+-------------+----------+--------------+  Mid forearm                                not visualized  +-----------------+-------------+----------+--------------+  Distal forearm                             not visualized  +-----------------+-------------+----------+--------------+  Wrist                                      not visualized  +-----------------+-------------+----------+--------------+ *See table(s) above for measurements and observations.  Diagnosing physician: Ruta Hinds MD Electronically signed by Ruta Hinds MD on 10/17/2018 at 11:03:18 AM.    Final      LOS: 6 days    Oren Binet, MD  [facility]  If 7PM-7AM, please contact night-coverage  Please page via [email or web address]  Go to [email or web address] and use [facility] universal password to access. If you do not have the password, please contact the hospital operator.  Locate the [facility] provider you  are looking for under [facility] and page to a number that you can be directly reached. If you still have difficulty reaching the provider, please page the [facility] (Director on Call) for the Hospitalists listed on amion for assistance.  10/18/2018, 2:37 PM

## 2018-10-18 NOTE — Transfer of Care (Signed)
Immediate Anesthesia Transfer of Care Note  Patient: Matthew [name]  Procedure(s) Performed: First Stage Basilic Transposition. (Left Arm Upper) INSERTION OF Right Internal Jugular DIALYSIS CATHETER (Right Chest)  Patient Location: PACU  Anesthesia Type:MAC  Level of Consciousness: drowsy and patient cooperative  Airway & Oxygen Therapy: Patient Spontanous Breathing  Post-op Assessment: Report given to RN and Post -op Vital signs reviewed and stable  Post vital signs: Reviewed and stable  Last Vitals:  Vitals Value Taken Time  BP    Temp    Pulse    Resp    SpO2      Last Pain:  Vitals:   10/18/18 0800  TempSrc:   PainSc: 0-No pain         Complications: No apparent anesthesia complications

## 2018-10-18 NOTE — Progress Notes (Signed)
NT at this time, informed this RN that he found pt drinking a ginger ale when he went in pt's room. Pt stated he was not told that he was NPO, despite that this RN went into room at 2300 and explained that pt was NPO, placed sign on door, and threw away drinks on bedside table. Pt apparently had ginger ale in a drawer because it was hot. Pt drank half, which was 144mL. RN informed pt again that he is NPO and that this is for his protection because he could aspirate during the procedure. Pt voiced that he understood. RN informed on call to make aware.   Eleanora Neighbor, RN

## 2018-10-18 NOTE — Progress Notes (Signed)
Renal Navigator submitted OP HD referral now that patient has access/HD has been initiated. Navigator will continue to follow closely.  Alphonzo Cruise, [place] [facility] Navigator [phone]

## 2018-10-18 NOTE — Progress Notes (Signed)
[facility] Progress Note    Assessment/ Plan:   #AKI on CKD 5 due to obstructive uropathy: The creatinine level 13.19 associated with acidosis, anemia and hypocalcemia. This is probably progressive CKD to ESRD. Unsure if he was evaluated by urologist however,at this time I do not think he has much renal function to salvage. -Continue Foley catheter.  Noncompliant with CIC  -CT scan of kidneys to better evaluate hydronephrosis--> revealed chronic hydro, bladder decompressed by Foley.   -Check UA and urine culture- on ancef now -Appreciate Dr. Scot Dock placing the RIJ TC (9/21) and lt BBT (great thrill).  Seen on  HD #1 2K bath RIJ TC  Plan for HD #2 Tues  #Bilateral hydronephrosis/neurogenic bladder: He is supposed to CIC per his report but unclear how much he's doing.    #Metabolic acidosis: improved with bicarb, stop today  #Anemia due to CKD: Noted plan for blood transfusion. Will start ESA (aranesp 170mcg qthur)  #Hypocalcemia: Replete with Ca gluc PTH level--> pending Resume calcitriol. TUMS 2 tablets TID.  #Renal osteodystrophy: phos 5.6  #Hypertension: not on antihypertensives at present-- would favor amlodipine if needed to start  Subjective:   Denies f/cn/v/dyspnea. Poor appetite   Objective:   BP (!) 141/77 (BP Location: Left Arm)   Pulse 74   Temp 97.6 F (36.4 C)   Resp 20   Ht 5' 10.5" (1.791 m)   Wt 75.7 kg   SpO2 98%   BMI 23.60 kg/m   Intake/Output Summary (Last 24 hours) at 10/18/2018 1401 Last data filed at 10/18/2018 1300 Gross per 24 hour  Intake 990 ml  Output 1175 ml  Net -185 ml   Weight change: -0.998 kg  Physical Exam: Gen: older gentleman, NAD CVS:RRR no m/r/g Resp: clear bilaterally Abd: NABS, nontender, can't feel bladder Ext: trace LE edema GU: + lots of urine in bag NEURO: + asterixis  Imaging: Dg Fluoro Guide Cv Line-no Report  Result Date: 10/18/2018 Fluoroscopy was utilized by the requesting  physician.  No radiographic interpretation.    Labs: BMET Recent Labs  Lab 10/13/18 0422 10/13/18 0423 10/14/18 0409 10/15/18 0405 10/16/18 0719 10/17/18 0745 10/18/18 0541  NA 140 140 140 139 137 136 136  K 4.5 4.5 3.5 3.4* 3.5 4.0 4.3  CL 107 106 99 97* 94* 94* 94*  CO2 20* 19* 26 28 26 24 26   GLUCOSE 88 87 107* 92 93 115* 100*  BUN 106* 109* 104* 98* 95* 95* 99*  CREATININE 12.63* 12.78* 11.82* 11.22* 10.81* 10.52* 10.66*  CALCIUM 5.9*  6.1* 5.9* 5.7* 6.6* 7.0* 7.5* 7.9*  PHOS 7.1* 7.0* 6.1* 6.2* 5.9* 5.6* 5.6*   CBC Recent Labs  Lab 10/15/18 0405 10/16/18 0719 10/17/18 0745 10/18/18 0541  WBC 6.0 6.4 7.1 6.9  HGB 8.1* 8.5* 8.6* 8.6*  HCT 24.5* 25.8* 26.5* 26.2*  MCV 91.8 92.5 94.6 94.2  PLT 300 296 299 299    Medications:    . calcitRIOL  0.5 mcg Oral Daily  . calcium carbonate  2 tablet Oral TID  . Chlorhexidine Gluconate Cloth  6 each Topical Q0600  . darbepoetin (ARANESP) injection - NON-DIALYSIS  100 mcg Subcutaneous Q Thu-1800  . folic acid  1 mg Oral Daily  . heparin  5,000 Units Subcutaneous Q8H  . multivitamin with minerals  1 tablet Oral Daily  . nicotine  14 mg Transdermal Daily  . sodium chloride flush  3 mL Intravenous Once  . thiamine  100 mg Oral Daily  Otelia Santee, MD 10/18/2018, 2:01 PM

## 2018-10-18 NOTE — Op Note (Signed)
    NAME: Matthew [name]    MRN: [id number] DOB: 1950/08/05    DATE OF OPERATION: 10/18/2018  PREOP DIAGNOSIS:    End-stage renal disease  POSTOP DIAGNOSIS:    Same  PROCEDURE:    Ultrasound-guided placement of right IJ tunneled dialysis catheter First stage basilic vein transposition  SURGEON: Judeth Cornfield. Scot Dock, MD, FACS  ASSIST: Leontine Locket, PA  ANESTHESIA: Local with sedation  EBL: Minimal  INDICATIONS:    [name] is a 68 y.o. male who presents for new access.  FINDINGS:   By duplex the basilic vein looks marginal therefore elected to do the basilic vein transposition in 2 stages.  TECHNIQUE:   The patient was taken to the operating room and sedated by anesthesia.  The neck and upper chest were prepped and draped in usual sterile fashion.  It looks like the right IJ was patent by duplex.  Under ultrasound guidance, after the skin was anesthetized, the right IJ was cannulated and a guidewire introduced into the superior vena cava under fluoroscopic control.  The tract over the wire was dilated.  A dilator and peel-away sheath were advanced over the wire and the wire and dilator were removed.  A 23 cm cath was positioned in the right atrium and the exit site for the catheter was selected.  The skin was anesthetized between the 2 areas.  The cath was brought through the tunnel cut to the appropriate length and the distal ports were attached.  Both ports withdrew easily with and flushed with heparinized saline and filled with concentrated heparin.  The catheter was secured at its exit site with a 3-0 nylon suture.  The IJ cannulation site was closed with a 4-0 subcuticular stitch.  Attention was turned to the left arm.  I had looked with the SonoSite and this confirmed the findings on duplex that the basilic vein was somewhat marginal but potentially usable.  Therefore elected to do a first stage basilic vein transposition.  After the skin was anesthetized a  longitudinal incision was made over the basilic vein just above the antecubital level.  Here 2 branches were dissected free until they branched again and the vein became quite small.  Through the same incision I was able to expose the brachial artery behind the nerve and then the patient was heparinized.  The vein was ligated distally and took the larger of the 2 branches.  This was spatulated.  The brachial artery was clamped proximally and distally and a longitudinal arteriotomy was made.  The vein was sewn into side to the artery using continuous 6-0 Prolene suture.  At the completion there was a good thrill in the fistula.  There was a palpable radial pulse.  Hemostasis was obtained in the wound.  Heparin was partially reversed with protamine.  The wound was closed with a deep layer of 3-0 Vicryl and the skin closed with 4-0 Vicryl.  Dermabond was applied.  The patient tolerated the procedure well was transferred to the recovery room in stable condition.  All needle and sponge counts were correct.  Deitra Mayo, MD, FACS [facility]  DATE OF DICTATION:   10/18/2018

## 2018-10-18 NOTE — Discharge Instructions (Signed)
° °  [facility]  Discharge Instructions  AV Fistula or Graft Surgery for Dialysis Access  Please refer to the following instructions for your post-procedure care. Your surgeon or physician assistant will discuss any changes with you.  Activity  You may drive the day following your surgery, if you are comfortable and no longer taking prescription pain medication. Resume full activity as the soreness in your incision resolves.  Bathing/Showering  You may shower after you go home. Keep your incision dry for 48 hours. Do not soak in a bathtub, hot tub, or swim until the incision heals completely. You may not shower if you have a hemodialysis catheter.  Incision Care  Clean your incision with mild soap and water after 48 hours. Pat the area dry with a clean towel. You do not need a bandage unless otherwise instructed. Do not apply any ointments or creams to your incision. You may have skin glue on your incision. Do not peel it off. It will come off on its own in about one week. Your arm may swell a bit after surgery. To reduce swelling use pillows to elevate your arm so it is above your heart. Your doctor will tell you if you need to lightly wrap your arm with an ACE bandage.  Diet  Resume your normal diet. There are not special food restrictions following this procedure. In order to heal from your surgery, it is CRITICAL to get adequate nutrition. Your body requires vitamins, minerals, and protein. Vegetables are the best source of vitamins and minerals. Vegetables also provide the perfect balance of protein. Processed food has little nutritional value, so try to avoid this.  Medications  Resume taking all of your medications. If your incision is causing pain, you may take over-the counter pain relievers such as acetaminophen (Tylenol). If you were prescribed a stronger pain medication, please be aware these medications can cause nausea and constipation. Prevent  nausea by taking the medication with a snack or meal. Avoid constipation by drinking plenty of fluids and eating foods with high amount of fiber, such as fruits, vegetables, and grains.  Do not take Tylenol if you are taking prescription pain medications.  Follow up Your surgeon may want to see you in the office following your access surgery. If so, this will be arranged at the time of your surgery.  Please call us immediately for any of the following conditions:  Increased pain, redness, drainage (pus) from your incision site Fever of 101 degrees or higher Severe or worsening pain at your incision site Hand pain or numbness.  Reduce your risk of vascular disease:  Stop smoking. If you would like help, call [facility] at 1-800-QUIT-NOW 904-277-3684) or [place] [facility] [place] your cholesterol Maintain a desired weight Control your diabetes Keep your blood pressure down  Dialysis  It will take several weeks to several months for your new dialysis access to be ready for use. Your surgeon will determine when it is okay to use it. Your nephrologist will continue to direct your dialysis. You can continue to use your Permcath until your new access is ready for use.   10/18/2018 Matthew [name] [id number] 09/29/1950  Surgeon(s): Angelia Mould, MD  Procedure(s): First Stage Basilic Transposition. INSERTION OF Right Internal Jugular DIALYSIS CATHETER  x Do not stick fistula for 12 weeks    If you have any questions, please call the office at [phone].

## 2018-10-19 ENCOUNTER — Encounter (HOSPITAL_COMMUNITY): Payer: Self-pay | Admitting: Vascular Surgery

## 2018-10-19 LAB — CBC
HCT: 28.4 % — ABNORMAL LOW (ref 39.0–52.0)
Hemoglobin: 8.6 g/dL — ABNORMAL LOW (ref 13.0–17.0)
MCH: 29.5 pg (ref 26.0–34.0)
MCHC: 30.3 g/dL (ref 30.0–36.0)
MCV: 97.3 fL (ref 80.0–100.0)
Platelets: 297 10*3/uL (ref 150–400)
RBC: 2.92 MIL/uL — ABNORMAL LOW (ref 4.22–5.81)
RDW: 14.1 % (ref 11.5–15.5)
WBC: 8.9 10*3/uL (ref 4.0–10.5)
nRBC: 0 % (ref 0.0–0.2)

## 2018-10-19 LAB — RENAL FUNCTION PANEL
Albumin: 2.9 g/dL — ABNORMAL LOW (ref 3.5–5.0)
Anion gap: 12 (ref 5–15)
BUN: 65 mg/dL — ABNORMAL HIGH (ref 8–23)
CO2: 26 mmol/L (ref 22–32)
Calcium: 8.3 mg/dL — ABNORMAL LOW (ref 8.9–10.3)
Chloride: 99 mmol/L (ref 98–111)
Creatinine, Ser: 7.76 mg/dL — ABNORMAL HIGH (ref 0.61–1.24)
GFR calc Af Amer: 8 mL/min — ABNORMAL LOW (ref >60)
GFR calc non Af Amer: 7 mL/min — ABNORMAL LOW (ref >60)
Glucose, Bld: 104 mg/dL — ABNORMAL HIGH (ref 70–99)
Phosphorus: 4.6 mg/dL (ref 2.5–4.6)
Potassium: 4.8 mmol/L (ref 3.5–5.1)
Sodium: 137 mmol/L (ref 135–145)

## 2018-10-19 LAB — HEPATITIS B SURFACE ANTIBODY,QUALITATIVE: Hep B S Ab: NONREACTIVE

## 2018-10-19 LAB — HEPATITIS B SURFACE ANTIGEN: Hepatitis B Surface Ag: NEGATIVE

## 2018-10-19 LAB — HEPATITIS B CORE ANTIBODY, TOTAL: Hep B Core Total Ab: NEGATIVE

## 2018-10-19 MED ORDER — HEPARIN SODIUM (PORCINE) 1000 UNIT/ML IJ SOLN
INTRAMUSCULAR | Status: AC
  Administered 2018-10-19: 3400 mL
  Filled 2018-10-19: qty 4

## 2018-10-19 MED ORDER — CALCITRIOL 0.5 MCG PO CAPS: 0.5000 ug | ORAL_CAPSULE | Freq: Every day | ORAL | 0 refills | Status: DC

## 2018-10-19 MED ORDER — CALCIUM CARBONATE ANTACID 500 MG PO CHEW: 2.0000 | CHEWABLE_TABLET | Freq: Three times a day (TID) | ORAL | 0 refills | Status: AC

## 2018-10-19 NOTE — Progress Notes (Signed)
PROGRESS NOTE        PATIENT DETAILS Name: Matthew [name] Age: 68 y.o. Sex: male Date of Birth: 1950/02/16 Admit Date: 10/12/2018 Admitting Physician Hosie Poisson, MD [id number], Ranell Patrick, MD  Brief Narrative: Patient is a 68 y.o. male with history of CKD stage V secondary to obstructive uropathy, hypertension-sent to the ED by primary care practitioner for worsening renal failure-now thought to have likely ESRD.  See below for further details.  Subjective: Lying comfortably in bed-no major events overnight.  Denies any chest pain or shortness of breath.  Assessment/Plan: AKI on CKD stage V-with progression to ESRD: Nephrology following-HD started on 9/21-currently undergoing HD #2 today.  Underwent right IJ tunneled dialysis catheter placement and basic vein transposition.  HD coordinator working on outpatient HD chair placement.  History of chronic obstructive uropathy with acute urinary retention on admission: CT renal stone study-did not show any obvious hydronephrosis-Foley catheter in place (had 1 L of urine output when Foley was placed)  Complicated UTI: UA grossly abnormal-had urinary retention admission as well-has completed a course of Ancef on 9/21.    Urine culture positive for staph epidermidis.  Anemia: Suspect secondary to CKD-no evidence of blood loss-patient is s/p 1 unit of PRBC on 9/15.  Remains on darbepoetin.  Hemoglobin slowly improving.  Follow.   Metabolic acidosis: Secondary to AKI-improved with bicarb supplementation  Hypocalcemia: Probably secondary to CKD-improving with calcitriol and calcium carbonate.  HTN: BP controlled-with HD.  Alcohol abuse: Last drink 2 days prior to this hospital stay-no signs of alcohol withdrawal.  Diet: Diet Order            Diet renal with fluid restriction Fluid restriction: 1200 mL Fluid; Room service appropriate? Yes; Fluid consistency: Thin  Diet effective now               DVT  Prophylaxis: Prophylactic Heparin   Code Status: Full code   Family Communication: None at bedside  Disposition Plan: Remain inpatient  Barrier to discharge: New ESRD-needs outpatient HD chair.  Antimicrobial agents: Anti-infectives (From admission, onward)   Start     Dose/Rate Route Frequency Ordered Stop   10/14/18 2200  ceFAZolin (ANCEF) IVPB 1 g/50 mL premix     1 g 100 mL/hr over 30 Minutes Intravenous Every 24 hours 10/14/18 1435 10/18/18 2329   10/13/18 0000  cefTRIAXone (ROCEPHIN) 1 g in sodium chloride 0.9 % 100 mL IVPB  Status:  Discontinued     1 g 200 mL/hr over 30 Minutes Intravenous Every 24 hours 10/12/18 1926 10/14/18 1435      Procedures: None  CONSULTS:  nephrology  Vascular surgery  Time spent: 25 minutes-Greater than 50% of this time was spent in counseling, explanation of diagnosis, planning of further management, and coordination of care.  MEDICATIONS: Scheduled Meds:  calcitRIOL  0.5 mcg Oral Daily   calcium carbonate  2 tablet Oral TID   Chlorhexidine Gluconate Cloth  6 each Topical Q0600   darbepoetin (ARANESP) injection - NON-DIALYSIS  100 mcg Subcutaneous Q [id number]   folic acid  1 mg Oral Daily   heparin  5,000 Units Subcutaneous Q8H   multivitamin with minerals  1 tablet Oral Daily   nicotine  14 mg Transdermal Daily   sodium chloride flush  3 mL Intravenous Once   thiamine  100 mg Oral Daily   Continuous Infusions:  sodium chloride Stopped (10/18/18 1154)   sodium chloride     sodium chloride Stopped (10/18/18 2329)   PRN Meds:.sodium chloride, sodium chloride, acetaminophen, alteplase, heparin, hydrALAZINE, lidocaine (PF), lidocaine-prilocaine, morphine injection, ondansetron (ZOFRAN) IV, oxyCODONE-acetaminophen, pentafluoroprop-tetrafluoroeth   PHYSICAL EXAM: Vital signs: Vitals:   10/19/18 1005 10/19/18 1017 10/19/18 1030 10/19/18 1100  BP: 121/67 117/60 115/68 (!) 105/59  Pulse: 78 77 78 74  Resp: 15 15      Temp: 98.2 F (36.8 C)     TempSrc: Oral     SpO2: 94%     Weight: 77.6 kg     Height:       Filed Weights   10/18/18 1555 10/19/18 0451 10/19/18 1005  Weight: 79.4 kg 79.2 kg 77.6 kg   Body mass index is 24.2 kg/m.   Gen Exam:Alert awake-not in any distress HEENT:atraumatic, normocephalic Chest: B/L clear to auscultation anteriorly CVS:S1S2 regular Abdomen:soft non tender, non distended Extremities:no edema Neurology: Non focal Skin: no rash  I have personally reviewed following labs and imaging studies  LABORATORY DATA: CBC: Recent Labs  Lab 10/15/18 0405 10/16/18 0719 10/17/18 0745 10/18/18 0541 10/19/18 0639  WBC 6.0 6.4 7.1 6.9 8.9  HGB 8.1* 8.5* 8.6* 8.6* 8.6*  HCT 24.5* 25.8* 26.5* 26.2* 28.4*  MCV 91.8 92.5 94.6 94.2 97.3  PLT 300 296 299 299 [id number]    Basic Metabolic Panel: Recent Labs  Lab 10/13/18 0422  10/15/18 0405 10/16/18 0719 10/17/18 0745 10/18/18 0541 10/19/18 0639  NA 140   < > 139 137 136 136 137  K 4.5   < > 3.4* 3.5 4.0 4.3 4.8  CL 107   < > 97* 94* 94* 94* 99  CO2 20*   < > 28 26 24 26 26   GLUCOSE 88   < > 92 93 115* 100* 104*  BUN 106*   < > 98* 95* 95* 99* 65*  CREATININE 12.63*   < > 11.22* 10.81* 10.52* 10.66* 7.76*  CALCIUM 5.9*   6.1*   < > 6.6* 7.0* 7.5* 7.9* 8.3*  MG 1.9  --   --   --   --   --   --   PHOS 7.1*   < > 6.2* 5.9* 5.6* 5.6* 4.6   < > = values in this interval not displayed.    GFR: Estimated Creatinine Clearance: 9.7 mL/min (A) (by C-G formula based on SCr of 7.76 mg/dL (H)).  Liver Function Tests: Recent Labs  Lab 10/15/18 0405 10/16/18 0719 10/17/18 0745 10/18/18 0541 10/19/18 0639  ALBUMIN 2.7* 2.8* 2.9* 2.9* 2.9*   No results for input(s): LIPASE, AMYLASE in the last 168 hours. No results for input(s): AMMONIA in the last 168 hours.  Coagulation Profile: No results for input(s): INR, PROTIME in the last 168 hours.  Cardiac Enzymes: No results for input(s): CKTOTAL, CKMB, CKMBINDEX,  TROPONINI in the last 168 hours.  BNP (last 3 results) No results for input(s): PROBNP in the last 8760 hours.  HbA1C: No results for input(s): HGBA1C in the last 72 hours.  CBG: Recent Labs  Lab 10/12/18 1631 10/18/18 0738 10/18/18 1201  GLUCAP 104* 86 104*    Lipid Profile: No results for input(s): CHOL, HDL, LDLCALC, TRIG, CHOLHDL, LDLDIRECT in the last 72 hours.  Thyroid Function Tests: No results for input(s): TSH, T4TOTAL, FREET4, T3FREE, THYROIDAB in the last 72 hours.  Anemia Panel: No results for input(s): VITAMINB12, FOLATE, FERRITIN, TIBC, IRON, RETICCTPCT in the last 72 hours.  Urine  analysis:    Component Value Date/Time   COLORURINE YELLOW 10/12/2018 1917   APPEARANCEUR CLOUDY (A) 10/12/2018 1917   LABSPEC 1.005 10/12/2018 1917   PHURINE 6.0 10/12/2018 1917   GLUCOSEU NEGATIVE 10/12/2018 1917   HGBUR MODERATE (A) 10/12/2018 1917   BILIRUBINUR NEGATIVE 10/12/2018 1917   BILIRUBINUR negative 04/26/2018 1049   KETONESUR NEGATIVE 10/12/2018 1917   PROTEINUR 100 (A) 10/12/2018 1917   UROBILINOGEN 0.2 04/26/2018 1049   NITRITE NEGATIVE 10/12/2018 1917   LEUKOCYTESUR LARGE (A) 10/12/2018 1917    Sepsis Labs: Lactic Acid, Venous    Component Value Date/Time   LATICACIDVEN 1.1 05/03/2018 1623    MICROBIOLOGY: Recent Results (from the past 240 hour(s))  SARS CORONAVIRUS 2 (TAT 6-24 HRS) Nasopharyngeal Nasopharyngeal Swab     Status: None   Collection Time: 10/12/18  5:17 PM   Specimen: Nasopharyngeal Swab  Result Value Ref Range Status   SARS Coronavirus 2 NEGATIVE NEGATIVE Final    Comment: (NOTE) SARS-CoV-2 target nucleic acids are NOT DETECTED. The SARS-CoV-2 RNA is generally detectable in upper and lower respiratory specimens during the acute phase of infection. Negative results do not preclude SARS-CoV-2 infection, do not rule out co-infections with other pathogens, and should not be used as the sole basis for treatment or other patient  management decisions. Negative results must be combined with clinical observations, patient history, and epidemiological information. The expected result is Negative. Fact Sheet for Patients: SugarRoll.be Fact Sheet for Healthcare Providers: https://www.woods-mathews.com/ This test is not yet approved or cleared by the [place] FDA and  has been authorized for detection and/or diagnosis of SARS-CoV-2 by FDA under an Emergency Use Authorization (EUA). This EUA will remain  in effect (meaning this test can be used) for the duration of the COVID-19 declaration under Section 56 4(b)(1) of the Act, 21 U.S.C. section 360bbb-3(b)(1), unless the authorization is terminated or revoked sooner. Performed at [facility], [place] [address] [place], [place] [address]   Urine culture     Status: Abnormal   Collection Time: 10/12/18  5:19 PM   Specimen: Urine, Random  Result Value Ref Range Status   Specimen Description URINE, RANDOM  Final   Special Requests   Final    NONE Performed at [facility], [place] [address] [place], [place] [address]    Culture >=100,000 COLONIES/mL STAPHYLOCOCCUS EPIDERMIDIS (A)  Final   Report Status 10/14/2018 FINAL  Final   Organism ID, Bacteria STAPHYLOCOCCUS EPIDERMIDIS (A)  Final      Susceptibility   Staphylococcus epidermidis - MIC*    CIPROFLOXACIN >=8 RESISTANT Resistant     GENTAMICIN <=0.5 SENSITIVE Sensitive     NITROFURANTOIN <=16 SENSITIVE Sensitive     OXACILLIN <=0.25 SENSITIVE Sensitive     TETRACYCLINE <=1 SENSITIVE Sensitive     VANCOMYCIN 2 SENSITIVE Sensitive     TRIMETH/SULFA 160 RESISTANT Resistant     CLINDAMYCIN <=0.25 SENSITIVE Sensitive     RIFAMPIN <=0.5 SENSITIVE Sensitive     Inducible Clindamycin NEGATIVE Sensitive     * >=100,000 COLONIES/mL STAPHYLOCOCCUS EPIDERMIDIS  Surgical pcr screen     Status: None   Collection Time: 10/18/18 12:24 AM   Specimen: Nasal Mucosa;  Nasal Swab  Result Value Ref Range Status   MRSA, PCR NEGATIVE NEGATIVE Final   Staphylococcus aureus NEGATIVE NEGATIVE Final    Comment: (NOTE) The Xpert SA Assay (FDA approved for NASAL specimens in patients 82 years of age and older), is one component of a  comprehensive surveillance program. It is not intended to diagnose infection nor to guide or monitor treatment. Performed at [facility], [place] [address], [place], [place] [address]     RADIOLOGY STUDIES/RESULTS: Dg Chest Port 1 View  Result Date: 10/18/2018 CLINICAL DATA:  Post dialysis catheter insertion EXAM: PORTABLE CHEST 1 VIEW COMPARISON:  05/03/2018 FINDINGS: Right dialysis catheter tip is in the SVC. No pneumothorax. Large bulla in the left upper lobe, stable. No confluent opacities or effusions. Mild cardiomegaly. IMPRESSION: Interval placement of right dialysis catheter.  No pneumothorax. Stable large left upper lobe bulla. Mild cardiomegaly. Electronically Signed   By: Rolm Baptise M.D.   On: 10/18/2018 19:28   Dg Fluoro Guide Cv Line-no Report  Result Date: 10/18/2018 Fluoroscopy was utilized by the requesting physician.  No radiographic interpretation.   Ct Renal Stone Study  Result Date: 10/12/2018 CLINICAL DATA:  Previous history of hydronephrosis. EXAM: CT ABDOMEN AND PELVIS WITHOUT CONTRAST TECHNIQUE: Multidetector CT imaging of the abdomen and pelvis was performed following the standard protocol without IV contrast. COMPARISON:  Ultrasound 05/04/2018 FINDINGS: Lower chest: Mild patchy scarring or atelectasis at both lung bases. No pleural effusion. Hepatobiliary: Normal without contrast. Pancreas: Normal Spleen: Normal Adrenals/Urinary Tract: Adrenal glands are normal. There is bilateral hydronephrosis at the level of the kidneys and renal pelves. I cannot clearly identify dilated ureters. Catheter is present in the bladder. I do not see evidence of stone disease. Stomach/Bowel: Chronic diverticulosis of the  colon without evidence of acute diverticulitis. No acute bowel pathology is seen. Vascular/Lymphatic: No aortic calcification or aortic aneurysm. IVC is normal. No retroperitoneal adenopathy. Reproductive: Enlarged prostate. Other: No free fluid or air. Musculoskeletal: Ordinary lumbar degenerative disc disease and degenerative facet disease. IMPRESSION: Chronic fullness of the renal collecting systems and renal pelves. I cannot clearly identify dilated ureters. No evidence of stone disease. Foley catheter in the bladder. Enlarged prostate. The findings could relate to chronic UPJ stenoses or could be sequela of chronic bladder outlet obstruction relieved by the Foley catheter. Electronically Signed   By: Nelson Chimes M.D.   On: 10/12/2018 20:53   Vas [place] Upper Ext Vein Mapping (pre-op Avf)  Result Date: 10/17/2018 UPPER EXTREMITY VEIN MAPPING  Indications: Pre-access. Comparison Study: No prior study Performing Technologist: Maudry Mayhew MHA, RDMS, RVT, RDCS  Examination Guidelines: A complete evaluation includes B-mode imaging, spectral Doppler, color Doppler, and power Doppler as needed of all accessible portions of each vessel. Bilateral testing is considered an integral part of a complete examination. Limited examinations for reoccurring indications may be performed as noted. +-----------------+-------------+----------+--------------+  Right Cephalic    Diameter (cm) Depth (cm)    Findings     +-----------------+-------------+----------+--------------+  Shoulder                                   not visualized  +-----------------+-------------+----------+--------------+  Prox upper arm                             not visualized  +-----------------+-------------+----------+--------------+  Mid upper arm                              not visualized  +-----------------+-------------+----------+--------------+  Dist upper arm  not visualized   +-----------------+-------------+----------+--------------+  Antecubital fossa                          not visualized  +-----------------+-------------+----------+--------------+  Prox forearm                               not visualized  +-----------------+-------------+----------+--------------+  Mid forearm                                not visualized  +-----------------+-------------+----------+--------------+  Dist forearm                               not visualized  +-----------------+-------------+----------+--------------+  Wrist                                      not visualized  +-----------------+-------------+----------+--------------+ +-----------------+-------------+----------+--------------+  Right Basilic     Diameter (cm) Depth (cm)    Findings     +-----------------+-------------+----------+--------------+  Prox upper arm        0.41                                 +-----------------+-------------+----------+--------------+  Mid upper arm         0.30                                 +-----------------+-------------+----------+--------------+  Dist upper arm        0.24                                 +-----------------+-------------+----------+--------------+  Antecubital fossa     0.30                    Thrombus     +-----------------+-------------+----------+--------------+  Prox forearm          0.12                                 +-----------------+-------------+----------+--------------+  Mid forearm                                not visualized  +-----------------+-------------+----------+--------------+  Distal forearm                             not visualized  +-----------------+-------------+----------+--------------+  Wrist                                      not visualized  +-----------------+-------------+----------+--------------+ +-----------------+-------------+----------+---------+  Left Cephalic     Diameter (cm) Depth (cm) Findings    +-----------------+-------------+----------+---------+  Shoulder              0.12         0.32               +-----------------+-------------+----------+---------+  Prox upper arm        0.08         0.22               +-----------------+-------------+----------+---------+  Mid upper arm         0.10         0.17               +-----------------+-------------+----------+---------+  Dist upper arm        0.10         0.20               +-----------------+-------------+----------+---------+  Antecubital fossa     0.23         0.24    branching  +-----------------+-------------+----------+---------+  Prox forearm          0.18         0.26    Thrombus   +-----------------+-------------+----------+---------+  Mid forearm           0.13         0.20               +-----------------+-------------+----------+---------+  Dist forearm          0.17         0.18               +-----------------+-------------+----------+---------+ +-----------------+-------------+----------+--------------+  Left Basilic      Diameter (cm) Depth (cm)    Findings     +-----------------+-------------+----------+--------------+  Prox upper arm        0.28                                 +-----------------+-------------+----------+--------------+  Mid upper arm         0.33                                 +-----------------+-------------+----------+--------------+  Dist upper arm        0.27                                 +-----------------+-------------+----------+--------------+  Antecubital fossa     0.36                                 +-----------------+-------------+----------+--------------+  Prox forearm          0.14                   branching     +-----------------+-------------+----------+--------------+  Mid forearm                                not visualized  +-----------------+-------------+----------+--------------+  Distal forearm                             not visualized   +-----------------+-------------+----------+--------------+  Wrist                                      not visualized  +-----------------+-------------+----------+--------------+ *See table(s) above for measurements and observations.  Diagnosing physician: Ruta Hinds  MD Electronically signed by Ruta Hinds MD on 10/17/2018 at 11:03:18 AM.    Final      LOS: 7 days   Oren Binet, MD  [facility]  If 7PM-7AM, please contact night-coverage  Please page via [email or web address]  Go to [email or web address] and use [facility] universal password to access. If you do not have the password, please contact the hospital operator.  Locate the [facility] provider you are looking for under [facility] and page to a number that you can be directly reached. If you still have difficulty reaching the provider, please page the [facility] (Director on Call) for the Hospitalists listed on amion for assistance.  10/19/2018, 11:31 AM

## 2018-10-19 NOTE — Progress Notes (Signed)
Physical Therapy Treatment Patient Details Name: Matthew [name] MRN: [id number] DOB: May 06, 1950 Today's Date: 10/19/2018    History of Present Illness  69 yo male with onset of acute urinary retention and UTI was admitted, and will have a HD cath installed soon.  Pt has been transfused, has metabolic acidosis and noted AKI with his CKD at 5.  Pt is Covid (-).  PMHx:  chronic obstructive uropathy, neurogenic bladder, EtOH abuse,     PT Comments    Continuing work on functional mobility and activity tolerance;  Noted likely for dc today -- Recommend RW for steadiness with amb; Discussed with MD and Case Mgr   Follow Up Recommendations  Home health PT;Supervision for mobility/OOB;Other (comment)(assess need for AD ongoing)     Equipment Recommendations  Rolling walker with 5" wheels    Recommendations for Other Services       Precautions / Restrictions Precautions Precautions: Fall Precaution Comments: Fall risk greatly reduced with use of RW    Mobility  Bed Mobility                  Transfers Overall transfer level: Needs assistance Equipment used: Rolling walker (2 wheeled) Transfers: Sit to/from Stand Sit to Stand: Supervision         General transfer comment: Cues fo rcontrol of descent to sit  Ambulation/Gait Ambulation/Gait assistance: Min guard;Supervision Gait Distance (Feet): 120 Feet Assistive device: Rolling walker (2 wheeled) Gait Pattern/deviations: Step-through pattern Gait velocity: reduced   General Gait Details: Using RW very well to stabilize; cues to self-monitor for activity tolerance; I favor the RW for steadiness with amb, especially for post HD   Stairs             Wheelchair Mobility    Modified Rankin (Stroke Patients Only)       Balance     Sitting balance-Leahy Scale: Fair       Standing balance-Leahy Scale: Fair                              Cognition Arousal/Alertness: Awake/alert Behavior  During Therapy: WFL for tasks assessed/performed Overall Cognitive Status: Within Functional Limits for tasks assessed                                        Exercises      General Comments        Pertinent Vitals/Pain Pain Assessment: No/denies pain    Home Living                      Prior Function            PT Goals (current goals can now be found in the care plan section) Acute Rehab PT Goals Patient Stated Goal: to walk and get home with family; and to be independent PT Goal Formulation: With patient Time For Goal Achievement: 10/29/18 Potential to Achieve Goals: Good Progress towards PT goals: Progressing toward goals    Frequency    Min 3X/week      PT Plan Current plan remains appropriate    Co-evaluation              AM-PAC PT "6 Clicks" Mobility   Outcome Measure  Help needed turning from your back to your side while in a flat bed without using bedrails?: A Little  Help needed moving from lying on your back to sitting on the side of a flat bed without using bedrails?: A Little Help needed moving to and from a bed to a chair (including a wheelchair)?: A Little Help needed standing up from a chair using your arms (e.g., wheelchair or bedside chair)?: A Little Help needed to walk in hospital room?: A Little Help needed climbing 3-5 steps with a railing? : Total 6 Click Score: 16    End of Session   Activity Tolerance: Patient tolerated treatment well Patient left: in chair;with call bell/phone within reach Nurse Communication: Mobility status PT Visit Diagnosis: Unsteadiness on feet (R26.81);Muscle weakness (generalized) (M62.81);Difficulty in walking, not elsewhere classified (R26.2)     Time: [id number] PT Time Calculation (min) (ACUTE ONLY): 13 min  Charges:  $Gait Training: 8-22 mins                     Roney Marion, [place]  Acute Rehabilitation Services Pager [phone] Office [place] 10/19/2018, 3:43 PM

## 2018-10-19 NOTE — Discharge Summary (Signed)
PATIENT DETAILS Name: Matthew [name] Age: 68 y.o. Sex: male Date of Birth: Feb 17, 1950 MRN: [id number]. Admitting Physician: Hosie Poisson, MD [id number], Ranell Patrick, MD  Admit Date: 10/12/2018 Discharge date: 10/19/2018  Recommendations for Outpatient Follow-up:  1. Follow up with PCP in 1-2 weeks 2. Please obtain BMP/CBC in one week 3. Please ensure follow-up with vascular surgery, nephrology-and with outpatient hemodialysis that has been set up.  Admitted From:  Home  Disposition: Home with home health services   [place]:  Yes  Equipment/Devices: None  Discharge Condition: Stable  CODE STATUS: FULL CODE  Diet recommendation:  Diet Order            Diet - low sodium heart healthy        Diet renal with fluid restriction Fluid restriction: 1200 mL Fluid; Room service appropriate? Yes; Fluid consistency: Thin  Diet effective now               Brief Summary: See H&P, Labs, Consult and Test reports for all details in brief, Patient is a 68 y.o. male with history of CKD stage V secondary to obstructive uropathy, hypertension-sent to the ED by primary care practitioner for worsening renal failure-now thought to have likely ESRD.  See below for further details.  Brief Hospital Course: AKI on CKD stage V-with progression to ESRD:  Seen by nephrology-assessed to have ESRD-subsequently started on hemodialysis-underwent second session on 9/22.  Patient was also seen by vascular surgery-underwent right IJ tunneled catheter placement and basilic vein transposition.  Per nephrology-patient's outpatient HD chair has been arranged-patient asked to go to his new outpatient hemodialysis center this coming Thursday at 11:45 AM.    History of chronic obstructive uropathy with acute urinary retention on admission: CT renal stone study-did not show any obvious hydronephrosis-Foley catheter in place (had 1 L of urine output when Foley was placed)-Foley catheter will be discontinued  prior to discharge.  Complicated UTI: UA grossly abnormal-had urinary retention admission as well-has completed a course of Ancef on 9/21.    Urine culture positive for staph epidermidis.  Anemia: Suspect secondary to CKD-no evidence of blood loss-patient is s/p 1 unit of PRBC on 9/15.  Remains on darbepoetin.  Hemoglobin slowly improving.  Follow closely in the outpatient setting  Metabolic acidosis: Secondary to AKI-improved with bicarb supplementation  Hypocalcemia: Probably secondary to CKD-improving with calcitriol and calcium carbonate.  HTN: BP controlled-with HD.  Currently on no BP medications.  Alcohol abuse: Last drink 2 days prior to this hospital stay-no signs of alcohol withdrawal.  Procedures/Studies: 9/21: Ultrasound-guided placement of right IJ tunneled dialysis catheter First stage basilic vein transposition  Discharge Diagnoses:  Active Problems:   Anemia, chronic renal failure, stage 4 (severe) (HCC)   Alcohol abuse   Normocytic anemia   Metabolic acidosis   Acute lower UTI   AKI (acute kidney injury) [facility])   Discharge Instructions:  Activity:  As tolerated  Discharge Instructions    Diet - low sodium heart healthy   Complete by: As directed    Discharge instructions   Complete by: As directed    Follow with Primary MD  Wendie Agreste, MD in 1-2 weeks  Please get a complete blood count and chemistry panel checked by your Primary MD at your next visit, and again as instructed by your Primary MD.  Get Medicines reviewed and adjusted: Please take all your medications with you for your next visit with your Primary MD  Laboratory/radiological [name]: Please request your Primary  MD to go over all hospital tests and procedure/radiological results at the follow up, please ask your Primary MD to get all Hospital records sent to his/her office.  In some cases, they will be blood work, cultures and biopsy results pending at the time of your discharge.  Please request that your primary care M.D. follows up on these results.  Also Note the following: If you experience worsening of your admission symptoms, develop shortness of breath, life threatening emergency, suicidal or homicidal thoughts you must seek medical attention immediately by calling 911 or calling your MD immediately  if symptoms less severe.  You must read complete instructions/literature along with all the possible adverse reactions/side effects for all the Medicines you take and that have been prescribed to you. Take any new Medicines after you have completely understood and accpet all the possible adverse reactions/side effects.   Do not drive when taking Pain medications or sleeping medications (Benzodaizepines)  Do not take more than prescribed Pain, Sleep and Anxiety Medications. It is not advisable to combine anxiety,sleep and pain medications without talking with your primary care practitioner  Special Instructions: If you have smoked or chewed Tobacco  in the last 2 yrs please stop smoking, stop any regular Alcohol  and or any Recreational drug use.  Wear Seat belts while driving.  Please note: You were cared for by a hospitalist during your hospital stay. Once you are discharged, your primary care physician will handle any further medical issues. Please note that NO REFILLS for any discharge medications will be authorized once you are discharged, as it is imperative that you return to your primary care physician (or establish a relationship with a primary care physician if you do not have one) for your post hospital discharge needs so that they can reassess your need for medications and monitor your lab values.   Increase activity slowly   Complete by: As directed      Allergies as of 10/19/2018   No Known Allergies     Medication List    STOP taking these medications   ibuprofen 200 MG tablet Commonly known as: ADVIL   sodium bicarbonate 650 MG tablet       TAKE these medications   aspirin 325 MG tablet Take 325 mg by mouth as needed for mild pain (thin blood).   calcitRIOL 0.5 MCG capsule Commonly known as: ROCALTROL Take 1 capsule (0.5 mcg total) by mouth daily.   calcium carbonate 500 MG chewable tablet Commonly known as: TUMS - dosed in mg elemental calcium Chew 2 tablets (400 mg of elemental calcium total) by mouth 3 (three) times daily.   folic acid 1 MG tablet Commonly known as: FOLVITE Take 1 tablet (1 mg total) by mouth daily.   multivitamin with minerals Tabs tablet Take 1 tablet by mouth daily.   thiamine 100 MG tablet Take 1 tablet (100 mg total) by mouth daily.            Durable Medical Equipment  (From admission, onward)         Start     Ordered   10/19/18 1527  For home use only DME Gilford Rile  [facility])  Once    Question:  Patient needs a walker to treat with the following condition  Answer:  Physical deconditioning   10/19/18 1527         Follow-up Information    Angelia Mould, MD In 6 weeks.   Specialties: Vascular Surgery, Cardiology Why: Office will call  you to arrange your appt (sent) Contact information: [place] [id number] [phone]        Wendie Agreste, MD. Schedule an appointment as soon as possible for a visit in 1 week(s).   Specialties: Family Medicine, Sports Medicine Contact information: [place] [id number] [phone]        Dialysis center Follow up.   Why: Tuesdays, Thursdays and Saturdays-please go to your first outpatient Dialysis at your new dialysis center at 11:45 AM         No Known Allergies  Consultations:   nephrology and vascular surgery  Other Procedures/Studies: Dg Chest Port 1 View  Result Date: 10/18/2018 CLINICAL [name]:  Post dialysis catheter insertion EXAM: PORTABLE CHEST 1 VIEW COMPARISON:  05/03/2018 FINDINGS: Right dialysis catheter tip is in the SVC. No pneumothorax. Large bulla in the left  upper lobe, stable. No confluent opacities or effusions. Mild cardiomegaly. IMPRESSION: Interval placement of right dialysis catheter.  No pneumothorax. Stable large left upper lobe bulla. Mild cardiomegaly. Electronically Signed   By: Rolm Baptise M.D.   On: 10/18/2018 19:28   Dg Fluoro Guide Cv Line-no Report  Result Date: 10/18/2018 Fluoroscopy was utilized by the requesting physician.  No radiographic interpretation.   Ct Renal Stone Study  Result Date: 10/12/2018 CLINICAL [name]:  Previous history of hydronephrosis. EXAM: CT ABDOMEN AND PELVIS WITHOUT CONTRAST TECHNIQUE: Multidetector CT imaging of the abdomen and pelvis was performed following the standard protocol without IV contrast. COMPARISON:  Ultrasound 05/04/2018 FINDINGS: Lower chest: Mild patchy scarring or atelectasis at both lung bases. No pleural effusion. Hepatobiliary: Normal without contrast. Pancreas: Normal Spleen: Normal Adrenals/Urinary Tract: Adrenal glands are normal. There is bilateral hydronephrosis at the level of the kidneys and renal pelves. I cannot clearly identify dilated ureters. Catheter is present in the bladder. I do not see evidence of stone disease. Stomach/Bowel: Chronic diverticulosis of the colon without evidence of acute diverticulitis. No acute bowel pathology is seen. Vascular/Lymphatic: No aortic calcification or aortic aneurysm. IVC is normal. No retroperitoneal adenopathy. Reproductive: Enlarged prostate. Other: No free fluid or air. Musculoskeletal: Ordinary lumbar degenerative disc disease and degenerative facet disease. IMPRESSION: Chronic fullness of the renal collecting systems and renal pelves. I cannot clearly identify dilated ureters. No evidence of stone disease. Foley catheter in the bladder. Enlarged prostate. The findings could relate to chronic UPJ stenoses or could be sequela of chronic bladder outlet obstruction relieved by the Foley catheter. Electronically Signed   By: Nelson Chimes M.D.   On:  10/12/2018 20:53   Vas [place] Upper Ext Vein Mapping (pre-op Avf)  Result Date: 10/17/2018 UPPER EXTREMITY VEIN MAPPING  Indications: Pre-access. Comparison Study: No prior study Performing Technologist: Maudry Mayhew MHA, RDMS, RVT, RDCS  Examination Guidelines: A complete evaluation includes B-mode imaging, spectral Doppler, color Doppler, and power Doppler as needed of all accessible portions of each vessel. Bilateral testing is considered an integral part of a complete examination. Limited examinations for reoccurring indications may be performed as noted. +-----------------+-------------+----------+--------------+  Right Cephalic    Diameter (cm) Depth (cm)    Findings     +-----------------+-------------+----------+--------------+  Shoulder                                   not visualized  +-----------------+-------------+----------+--------------+  Prox upper arm  not visualized  +-----------------+-------------+----------+--------------+  Mid upper arm                              not visualized  +-----------------+-------------+----------+--------------+  Dist upper arm                             not visualized  +-----------------+-------------+----------+--------------+  Antecubital fossa                          not visualized  +-----------------+-------------+----------+--------------+  Prox forearm                               not visualized  +-----------------+-------------+----------+--------------+  Mid forearm                                not visualized  +-----------------+-------------+----------+--------------+  Dist forearm                               not visualized  +-----------------+-------------+----------+--------------+  Wrist                                      not visualized  +-----------------+-------------+----------+--------------+ +-----------------+-------------+----------+--------------+  Right Basilic     Diameter (cm) Depth (cm)    Findings      +-----------------+-------------+----------+--------------+  Prox upper arm        0.41                                 +-----------------+-------------+----------+--------------+  Mid upper arm         0.30                                 +-----------------+-------------+----------+--------------+  Dist upper arm        0.24                                 +-----------------+-------------+----------+--------------+  Antecubital fossa     0.30                    Thrombus     +-----------------+-------------+----------+--------------+  Prox forearm          0.12                                 +-----------------+-------------+----------+--------------+  Mid forearm                                not visualized  +-----------------+-------------+----------+--------------+  Distal forearm                             not visualized  +-----------------+-------------+----------+--------------+  Wrist  not visualized  +-----------------+-------------+----------+--------------+ +-----------------+-------------+----------+---------+  Left Cephalic     Diameter (cm) Depth (cm) Findings   +-----------------+-------------+----------+---------+  Shoulder              0.12         0.32               +-----------------+-------------+----------+---------+  Prox upper arm        0.08         0.22               +-----------------+-------------+----------+---------+  Mid upper arm         0.10         0.17               +-----------------+-------------+----------+---------+  Dist upper arm        0.10         0.20               +-----------------+-------------+----------+---------+  Antecubital fossa     0.23         0.24    branching  +-----------------+-------------+----------+---------+  Prox forearm          0.18         0.26    Thrombus   +-----------------+-------------+----------+---------+  Mid forearm           0.13         0.20                +-----------------+-------------+----------+---------+  Dist forearm          0.17         0.18               +-----------------+-------------+----------+---------+ +-----------------+-------------+----------+--------------+  Left Basilic      Diameter (cm) Depth (cm)    Findings     +-----------------+-------------+----------+--------------+  Prox upper arm        0.28                                 +-----------------+-------------+----------+--------------+  Mid upper arm         0.33                                 +-----------------+-------------+----------+--------------+  Dist upper arm        0.27                                 +-----------------+-------------+----------+--------------+  Antecubital fossa     0.36                                 +-----------------+-------------+----------+--------------+  Prox forearm          0.14                   branching     +-----------------+-------------+----------+--------------+  Mid forearm                                not visualized  +-----------------+-------------+----------+--------------+  Distal forearm  not visualized  +-----------------+-------------+----------+--------------+  Wrist                                      not visualized  +-----------------+-------------+----------+--------------+ *See table(s) above for measurements and observations.  Diagnosing physician: Ruta Hinds MD Electronically signed by Ruta Hinds MD on 10/17/2018 at 11:03:18 AM.    Final       TODAY-DAY OF DISCHARGE:  Subjective:   Matthew [name] today has no headache,no chest abdominal pain,no new weakness tingling or numbness, feels much better wants to go home today.   Objective:   Blood pressure 126/74, pulse 85, temperature 98.3 F (36.8 C), temperature source Oral, resp. rate 18, height 5' 10.5" (1.791 m), weight 75.6 kg, SpO2 96 %.  Intake/Output Summary (Last 24 hours) at 10/19/2018 1528 Last [name] filed at 10/19/2018  1421 Gross per 24 hour  Intake 724.95 ml  Output 3300 ml  Net -2575.05 ml   Filed Weights   10/19/18 0451 10/19/18 1005 10/19/18 1317  Weight: 79.2 kg 77.6 kg 75.6 kg    Exam: Awake Alert, Oriented *3, No new F.N deficits, Normal affect .AT,PERRAL Supple Neck,No JVD, No cervical lymphadenopathy appriciated.  Symmetrical Chest wall movement, Good air movement bilaterally, CTAB RRR,No Gallops,Rubs or new Murmurs, No Parasternal Heave +ve B.Sounds, Abd Soft, Non tender, No organomegaly appriciated, No rebound -guarding or rigidity. No Cyanosis, Clubbing or edema, No new Rash or bruise   PERTINENT RADIOLOGIC STUDIES: Dg Chest Port 1 View  Result Date: 10/18/2018 CLINICAL [name]:  Post dialysis catheter insertion EXAM: PORTABLE CHEST 1 VIEW COMPARISON:  05/03/2018 FINDINGS: Right dialysis catheter tip is in the SVC. No pneumothorax. Large bulla in the left upper lobe, stable. No confluent opacities or effusions. Mild cardiomegaly. IMPRESSION: Interval placement of right dialysis catheter.  No pneumothorax. Stable large left upper lobe bulla. Mild cardiomegaly. Electronically Signed   By: Rolm Baptise M.D.   On: 10/18/2018 19:28   Dg Fluoro Guide Cv Line-no Report  Result Date: 10/18/2018 Fluoroscopy was utilized by the requesting physician.  No radiographic interpretation.   Ct Renal Stone Study  Result Date: 10/12/2018 CLINICAL [name]:  Previous history of hydronephrosis. EXAM: CT ABDOMEN AND PELVIS WITHOUT CONTRAST TECHNIQUE: Multidetector CT imaging of the abdomen and pelvis was performed following the standard protocol without IV contrast. COMPARISON:  Ultrasound 05/04/2018 FINDINGS: Lower chest: Mild patchy scarring or atelectasis at both lung bases. No pleural effusion. Hepatobiliary: Normal without contrast. Pancreas: Normal Spleen: Normal Adrenals/Urinary Tract: Adrenal glands are normal. There is bilateral hydronephrosis at the level of the kidneys and renal pelves. I cannot clearly  identify dilated ureters. Catheter is present in the bladder. I do not see evidence of stone disease. Stomach/Bowel: Chronic diverticulosis of the colon without evidence of acute diverticulitis. No acute bowel pathology is seen. Vascular/Lymphatic: No aortic calcification or aortic aneurysm. IVC is normal. No retroperitoneal adenopathy. Reproductive: Enlarged prostate. Other: No free fluid or air. Musculoskeletal: Ordinary lumbar degenerative disc disease and degenerative facet disease. IMPRESSION: Chronic fullness of the renal collecting systems and renal pelves. I cannot clearly identify dilated ureters. No evidence of stone disease. Foley catheter in the bladder. Enlarged prostate. The findings could relate to chronic UPJ stenoses or could be sequela of chronic bladder outlet obstruction relieved by the Foley catheter. Electronically Signed   By: Nelson Chimes M.D.   On: 10/12/2018 20:53   Vas [place] Upper Ext Vein Mapping (pre-op  Avf)  Result Date: 10/17/2018 UPPER EXTREMITY VEIN MAPPING  Indications: Pre-access. Comparison Study: No prior study Performing Technologist: Maudry Mayhew MHA, RDMS, RVT, RDCS  Examination Guidelines: A complete evaluation includes B-mode imaging, spectral Doppler, color Doppler, and power Doppler as needed of all accessible portions of each vessel. Bilateral testing is considered an integral part of a complete examination. Limited examinations for reoccurring indications may be performed as noted. +-----------------+-------------+----------+--------------+  Right Cephalic    Diameter (cm) Depth (cm)    Findings     +-----------------+-------------+----------+--------------+  Shoulder                                   not visualized  +-----------------+-------------+----------+--------------+  Prox upper arm                             not visualized  +-----------------+-------------+----------+--------------+  Mid upper arm                              not visualized   +-----------------+-------------+----------+--------------+  Dist upper arm                             not visualized  +-----------------+-------------+----------+--------------+  Antecubital fossa                          not visualized  +-----------------+-------------+----------+--------------+  Prox forearm                               not visualized  +-----------------+-------------+----------+--------------+  Mid forearm                                not visualized  +-----------------+-------------+----------+--------------+  Dist forearm                               not visualized  +-----------------+-------------+----------+--------------+  Wrist                                      not visualized  +-----------------+-------------+----------+--------------+ +-----------------+-------------+----------+--------------+  Right Basilic     Diameter (cm) Depth (cm)    Findings     +-----------------+-------------+----------+--------------+  Prox upper arm        0.41                                 +-----------------+-------------+----------+--------------+  Mid upper arm         0.30                                 +-----------------+-------------+----------+--------------+  Dist upper arm        0.24                                 +-----------------+-------------+----------+--------------+  Antecubital fossa     0.30  Thrombus     +-----------------+-------------+----------+--------------+  Prox forearm          0.12                                 +-----------------+-------------+----------+--------------+  Mid forearm                                not visualized  +-----------------+-------------+----------+--------------+  Distal forearm                             not visualized  +-----------------+-------------+----------+--------------+  Wrist                                      not visualized  +-----------------+-------------+----------+--------------+  +-----------------+-------------+----------+---------+  Left Cephalic     Diameter (cm) Depth (cm) Findings   +-----------------+-------------+----------+---------+  Shoulder              0.12         0.32               +-----------------+-------------+----------+---------+  Prox upper arm        0.08         0.22               +-----------------+-------------+----------+---------+  Mid upper arm         0.10         0.17               +-----------------+-------------+----------+---------+  Dist upper arm        0.10         0.20               +-----------------+-------------+----------+---------+  Antecubital fossa     0.23         0.24    branching  +-----------------+-------------+----------+---------+  Prox forearm          0.18         0.26    Thrombus   +-----------------+-------------+----------+---------+  Mid forearm           0.13         0.20               +-----------------+-------------+----------+---------+  Dist forearm          0.17         0.18               +-----------------+-------------+----------+---------+ +-----------------+-------------+----------+--------------+  Left Basilic      Diameter (cm) Depth (cm)    Findings     +-----------------+-------------+----------+--------------+  Prox upper arm        0.28                                 +-----------------+-------------+----------+--------------+  Mid upper arm         0.33                                 +-----------------+-------------+----------+--------------+  Dist upper arm        0.27                                 +-----------------+-------------+----------+--------------+  Antecubital fossa     0.36                                 +-----------------+-------------+----------+--------------+  Prox forearm          0.14                   branching     +-----------------+-------------+----------+--------------+  Mid forearm                                not visualized  +-----------------+-------------+----------+--------------+   Distal forearm                             not visualized  +-----------------+-------------+----------+--------------+  Wrist                                      not visualized  +-----------------+-------------+----------+--------------+ *See table(s) above for measurements and observations.  Diagnosing physician: Ruta Hinds MD Electronically signed by Ruta Hinds MD on 10/17/2018 at 11:03:18 AM.    Final      PERTINENT LAB RESULTS: CBC: Recent Labs    10/18/18 0541 10/19/18 0639  WBC 6.9 8.9  HGB 8.6* 8.6*  HCT 26.2* 28.4*  PLT 299 297   CMET CMP     Component Value Date/Time   NA 137 10/19/2018 0639   NA 138 10/11/2018 1132   K 4.8 10/19/2018 0639   CL 99 10/19/2018 0639   CO2 26 10/19/2018 0639   GLUCOSE 104 (H) 10/19/2018 0639   BUN 65 (H) 10/19/2018 0639   BUN 103 (HH) 10/11/2018 1132   CREATININE 7.76 (H) 10/19/2018 0639   CALCIUM 8.3 (L) 10/19/2018 0639   CALCIUM 6.1 (LL) 10/13/2018 0422   PROT 7.4 05/06/2018 0339   PROT 9.3 (H) 04/26/2018 1214   ALBUMIN 2.9 (L) 10/19/2018 0639   ALBUMIN 3.7 (L) 04/26/2018 1214   AST 15 05/06/2018 0339   ALT 13 05/06/2018 0339   ALKPHOS 36 (L) 05/06/2018 0339   BILITOT 0.3 05/06/2018 0339   BILITOT 0.4 04/26/2018 1214   GFRNONAA 7 (L) 10/19/2018 0639   GFRAA 8 (L) 10/19/2018 0639    GFR Estimated Creatinine Clearance: 9.7 mL/min (A) (by C-G formula based on SCr of 7.76 mg/dL (H)). No results for input(s): LIPASE, AMYLASE in the last 72 hours. No results for input(s): CKTOTAL, CKMB, CKMBINDEX, TROPONINI in the last 72 hours. Invalid input(s): POCBNP No results for input(s): DDIMER in the last 72 hours. No results for input(s): HGBA1C in the last 72 hours. No results for input(s): CHOL, HDL, LDLCALC, TRIG, CHOLHDL, LDLDIRECT in the last 72 hours. No results for input(s): TSH, T4TOTAL, T3FREE, THYROIDAB in the last 72 hours.  Invalid input(s): FREET3 No results for input(s): VITAMINB12, FOLATE, FERRITIN, TIBC, IRON,  RETICCTPCT in the last 72 hours. Coags: No results for input(s): INR in the last 72 hours.  Invalid input(s): PT Microbiology: Recent Results (from the past 240 hour(s))  SARS CORONAVIRUS 2 (TAT 6-24 HRS) Nasopharyngeal Nasopharyngeal Swab     Status: None   Collection Time: 10/12/18  5:17 PM   Specimen: Nasopharyngeal Swab  Result Value Ref Range Status   SARS Coronavirus 2 NEGATIVE NEGATIVE Final    Comment: (NOTE) SARS-CoV-2 target nucleic  acids are NOT DETECTED. The SARS-CoV-2 RNA is generally detectable in upper and lower respiratory specimens during the acute phase of infection. Negative results do not preclude SARS-CoV-2 infection, do not rule out co-infections with other pathogens, and should not be used as the sole basis for treatment or other patient management decisions. Negative results must be combined with clinical observations, patient history, and epidemiological information. The expected result is Negative. Fact Sheet for Patients: SugarRoll.be Fact Sheet for Healthcare Providers: https://www.woods-mathews.com/ This test is not yet approved or cleared by the [place] FDA and  has been authorized for detection and/or diagnosis of SARS-CoV-2 by FDA under an Emergency Use Authorization (EUA). This EUA will remain  in effect (meaning this test can be used) for the duration of the COVID-19 declaration under Section 56 4(b)(1) of the Act, 21 U.S.C. section 360bbb-3(b)(1), unless the authorization is terminated or revoked sooner. Performed at [facility], [place] [address] [place], [place] [address]   Urine culture     Status: Abnormal   Collection Time: 10/12/18  5:19 PM   Specimen: Urine, Random  Result Value Ref Range Status   Specimen Description URINE, RANDOM  Final   Special Requests   Final    NONE Performed at [facility], [place] [address], [place], [place] [address]    Culture >=100,000 COLONIES/mL  STAPHYLOCOCCUS EPIDERMIDIS (A)  Final   Report Status 10/14/2018 FINAL  Final   Organism ID, Bacteria STAPHYLOCOCCUS EPIDERMIDIS (A)  Final      Susceptibility   Staphylococcus epidermidis - MIC*    CIPROFLOXACIN >=8 RESISTANT Resistant     GENTAMICIN <=0.5 SENSITIVE Sensitive     NITROFURANTOIN <=16 SENSITIVE Sensitive     OXACILLIN <=0.25 SENSITIVE Sensitive     TETRACYCLINE <=1 SENSITIVE Sensitive     VANCOMYCIN 2 SENSITIVE Sensitive     TRIMETH/SULFA 160 RESISTANT Resistant     CLINDAMYCIN <=0.25 SENSITIVE Sensitive     RIFAMPIN <=0.5 SENSITIVE Sensitive     Inducible Clindamycin NEGATIVE Sensitive     * >=100,000 COLONIES/mL STAPHYLOCOCCUS EPIDERMIDIS  Surgical pcr screen     Status: None   Collection Time: 10/18/18 12:24 AM   Specimen: Nasal Mucosa; Nasal Swab  Result Value Ref Range Status   MRSA, PCR NEGATIVE NEGATIVE Final   Staphylococcus aureus NEGATIVE NEGATIVE Final    Comment: (NOTE) The Xpert SA Assay (FDA approved for NASAL specimens in patients 73 years of age and older), is one component of a comprehensive surveillance program. It is not intended to diagnose infection nor to guide or monitor treatment. Performed at [facility], [place] [address], [place],  [address]     FURTHER DISCHARGE INSTRUCTIONS:  Get Medicines reviewed and adjusted: Please take all your medications with you for your next visit with your Primary MD  Laboratory/radiological [name]: Please request your Primary MD to go over all hospital tests and procedure/radiological results at the follow up, please ask your Primary MD to get all Hospital records sent to his/her office.  In some cases, they will be blood work, cultures and biopsy results pending at the time of your discharge. Please request that your primary care M.D. goes through all the records of your hospital [name] and follows up on these results.  Also Note the following: If you experience worsening of your admission  symptoms, develop shortness of breath, life threatening emergency, suicidal or homicidal thoughts you must seek medical attention immediately by calling 911 or calling your MD  immediately  if symptoms less severe.  You must read complete instructions/literature along with all the possible adverse reactions/side effects for all the Medicines you take and that have been prescribed to you. Take any new Medicines after you have completely understood and accpet all the possible adverse reactions/side effects.   Do not drive when taking Pain medications or sleeping medications (Benzodaizepines)  Do not take more than prescribed Pain, Sleep and Anxiety Medications. It is not advisable to combine anxiety,sleep and pain medications without talking with your primary care practitioner  Special Instructions: If you have smoked or chewed Tobacco  in the last 2 yrs please stop smoking, stop any regular Alcohol  and or any Recreational drug use.  Wear Seat belts while driving.  Please note: You were cared for by a hospitalist during your hospital stay. Once you are discharged, your primary care physician will handle any further medical issues. Please note that NO REFILLS for any discharge medications will be authorized once you are discharged, as it is imperative that you return to your primary care physician (or establish a relationship with a primary care physician if you do not have one) for your post hospital discharge needs so that they can reassess your need for medications and monitor your lab values.  Total Time spent coordinating discharge including counseling, education and face to face time equals 35 minutes.  SignedOren Binet 10/19/2018 3:28 PM

## 2018-10-19 NOTE — Progress Notes (Signed)
DISCHARGE NOTE HOME Matthew [name] to be discharged Home per MD order. Discussed prescriptions and follow up appointments with the patient. Prescriptions given to patient; medication list explained in detail. Patient verbalized understanding.  Skin clean, dry and intact without evidence of skin break down, no evidence of skin tears noted. IV catheter discontinued intact. Site without signs and symptoms of complications. Dressing and pressure applied. Pt denies pain at the site currently. No complaints noted.  Patient free of drains and wounds.    HD catheter care explained to patient.   An After Visit Summary (AVS) was printed and given to the patient. Patient escorted via wheelchair, and discharged home via private auto.  [name] BSN, RN3

## 2018-10-19 NOTE — Progress Notes (Signed)
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 This is probably progressive CKD to ESRD. Unsure if he was evaluated by urologist however,at this time I do not think he has much renal function to salvage. -Continue Foley catheter. Noncompliant with CIC  -CT scan of kidneys to better evaluate hydronephrosis-->revealed chronic hydro, bladder decompressed by Foley.  -Check UA and urine culture- on ancef now -Appreciate Dr. Scot Dock placing the RIJ TC (9/21) and lt BBF (great thrill).  Seen on  HD #2 (HD #1 9/21) 2K bath RIJ TC  He has a seat at [facility] [phone] and next HD can be as an outpatient on Thursday. He will need to show up at 1145AM on [place] for his 1st treatment.  #Bilateral hydronephrosis/neurogenic bladder: He is supposed to [facility]IC per his report but unclear how much he's doing.   #Metabolic acidosis: improved with bicarb, stop today  #Anemia due to CKD: Noted plan for blood transfusion. Started ESA (aranesp 158mcg qthur)  #Hypocalcemia: Replete with Ca gluc PTH level-->pending Resume calcitriol. TUMS 2 tablets TID.  #Renal osteodystrophy: phos 5.6  #Hypertension: not on antihypertensives at present-- would favor amlodipine if needed to start  Subjective:   Denies f/cn/v/dyspnea. Poor appetite   Objective:   BP 118/66 (BP Location: Right Arm)   Pulse 79   Temp 98.2 F (36.8 C) (Oral)   Resp 15   Ht 5' 10.5" (1.791 m)   Wt 77.6 kg   SpO2 94%   BMI 24.20 kg/m   Intake/Output Summary (Last 24 hours) at 10/19/2018 1323 Last data filed at 10/19/2018 [id number] Gross per 24 hour  Intake 754.95 ml  Output 1400 ml  Net -645.05 ml   Weight change: 3.84 kg  Physical Exam: Gen: older gentleman, NAD CVS:RRR no m/r/g Resp: clear bilaterally Abd: NABS, nontender, can't feel bladder Ext: trace LE edema GU:  urine in  bag ACCESS: RIJ TC and lt BBF + thrill   Imaging: Dg Chest Port 1 View  Result Date: 10/18/2018 CLINICAL DATA:  Post dialysis catheter insertion EXAM: PORTABLE CHEST 1 VIEW COMPARISON:  05/03/2018 FINDINGS: Right dialysis catheter tip is in the SVC. No pneumothorax. Large bulla in the left upper lobe, stable. No confluent opacities or effusions. Mild cardiomegaly. IMPRESSION: Interval placement of right dialysis catheter.  No pneumothorax. Stable large left upper lobe bulla. Mild cardiomegaly. Electronically Signed   By: Rolm Baptise M.D.   On: 10/18/2018 19:28   Dg Fluoro Guide Cv Line-no Report  Result Date: 10/18/2018 Fluoroscopy was utilized by the requesting physician.  No radiographic interpretation.    Labs: BMET Recent Labs  Lab 10/13/18 0423 10/14/18 0409 10/15/18 0405 10/16/18 0719 10/17/18 0745 10/18/18 0541 10/19/18 0639  NA 140 140 139 137 136 136 137  K 4.5 3.5 3.4* 3.5 4.0 4.3 4.8  CL 106 99 97* 94* 94* 94* 99  CO2 19* 26 28 26 24 26 26   GLUCOSE 87 107* 92 93 115* 100* 104*  BUN 109* 104* 98* 95* 95* 99* 65*  CREATININE 12.78* 11.82* 11.22* 10.81* 10.52* 10.66* 7.76*  CALCIUM 5.9* 5.7* 6.6* 7.0* 7.5* 7.9* 8.3*  PHOS 7.0* 6.1* 6.2* 5.9* 5.6* 5.6* 4.6   CBC Recent Labs  Lab 10/16/18 0719 10/17/18 0745 10/18/18 0541 10/19/18 0639  WBC 6.4 7.1 6.9 8.9  HGB 8.5* 8.6* 8.6* 8.6*  HCT 25.8* 26.5* 26.2* 28.4*  MCV 92.5 94.6 94.2 97.3  PLT 296 299 299 297    Medications:    . calcitRIOL  0.5 mcg Oral Daily  . calcium carbonate  2 tablet Oral TID  . Chlorhexidine Gluconate Cloth  6 each Topical Q0600  . darbepoetin (ARANESP) injection - NON-DIALYSIS  100 mcg Subcutaneous Q Thu-1800  . folic acid  1 mg Oral Daily  . heparin      . heparin  5,000 Units Subcutaneous Q8H  . multivitamin with minerals  1 tablet Oral Daily  . nicotine  14 mg Transdermal Daily  . sodium chloride flush  3 mL Intravenous Once  . thiamine  100 mg Oral Daily      Otelia Santee,  MD 10/19/2018, 1:23 PM

## 2018-10-19 NOTE — TOC Transition Note (Addendum)
Transition of Care [facility]) - CM/SW Discharge Note   Patient Details  Name: Matthew [name] MRN: [id number] Date of Birth: [birth year]  Transition of Care [facility]) CM/SW Contact:  Bartholomew Crews, RN Phone Number: [phone] 10/19/2018, 3:35 PM   Clinical Narrative:    Spoke with patient at the bedside. PTA home with spouse and daughter. Independent. Discussed recommendations for home health PT and OT. Patient agreeable. Referral accepted by [facility]. Orders in per MD. Patient states that he has signed up for transportation for dialysis. No concerns about getting medications. No further transition of care needs identified at this time.   Update: Advised that patient needs RW. Orders in. Referral to [facility] to deliver to bedside today - accepted.   Final next level of care: [place] Barriers to Discharge: No Barriers Identified   Patient Goals and CMS Choice Patient states their goals for this hospitalization and ongoing recovery are:: return home CMS Medicare.gov Compare Post Acute Care list provided to:: Patient Choice offered to / list presented to : Patient  Discharge Placement                       Discharge Plan and Services                DME Arranged: N/A DME Agency: NA       HH Arranged: PT, OT HH Agency: [facility] Date [place] Agency Contacted: 10/19/18 Time [place]: [id number] Representative spoke with at [place]: Claude ([place]) Interventions     Readmission Risk Interventions No flowsheet data found.

## 2018-10-19 NOTE — Progress Notes (Signed)
Patient has been accepted for OP HD treatment at [facility] clinic on a TTS schedule with a seat time of 12:45pm. He needs to arrive 20 minutes early to his appointments.  On his first day of treatment, patient needs to arrive at 11:45am to sign consent paperwork. Renal Navigator notified Dr. Lin/Nephrologist and met with patient at bedside to inform and provide information in writing. Renal Navigator contacted patient's daughter/[name] to inform of schedule.  Alphonzo Cruise,  Renal Navigator [phone]

## 2018-10-19 NOTE — Progress Notes (Addendum)
  Progress Note    10/19/2018 7:57 AM 1 Day Post-Op  Subjective:  Denies signs or symptoms of steal syndrome L hand   Vitals:   10/18/18 2132 10/19/18 0451  BP: (!) 141/72 (!) 141/72  Pulse: 81 84  Resp: 16 16  Temp: 98.7 F (37.1 C) 99.8 F (37.7 C)  SpO2: 98% 98%   Physical Exam: Lungs:  Non labored Incisions:  R chest TDC site without hematoma or bleeding; L antecubitum incision c/d/i Extremities:  Palpable L radial pulse, palpable thrill near anastomosis Neurologic: A&O  CBC    Component Value Date/Time   WBC 8.9 10/19/2018 0639   RBC 2.92 (L) 10/19/2018 0639   HGB 8.6 (L) 10/19/2018 0639   HGB 6.3 (LL) 10/11/2018 1132   HCT 28.4 (L) 10/19/2018 0639   HCT 20.3 (L) 10/11/2018 1132   PLT 297 10/19/2018 0639   PLT 292 10/11/2018 1132   MCV 97.3 10/19/2018 0639   MCV 89 10/11/2018 1132   MCH 29.5 10/19/2018 0639   MCHC 30.3 10/19/2018 0639   RDW 14.1 10/19/2018 0639   RDW 14.1 10/11/2018 1132   LYMPHSABS 1.7 05/06/2018 0339   MONOABS 0.6 05/06/2018 0339   EOSABS 0.3 05/06/2018 0339   BASOSABS 0.0 05/06/2018 0339    BMET    Component Value Date/Time   NA 137 10/19/2018 0639   NA 138 10/11/2018 1132   K 4.8 10/19/2018 0639   CL 99 10/19/2018 0639   CO2 26 10/19/2018 0639   GLUCOSE 104 (H) 10/19/2018 0639   BUN 65 (H) 10/19/2018 0639   BUN 103 (HH) 10/11/2018 1132   CREATININE 7.76 (H) 10/19/2018 0639   CALCIUM 8.3 (L) 10/19/2018 0639   CALCIUM 6.1 (LL) 10/13/2018 0422   GFRNONAA 7 (L) 10/19/2018 0639   GFRAA 8 (L) 10/19/2018 0639    INR    Component Value Date/Time   INR 1.2 05/03/2018 1623     Intake/Output Summary (Last 24 hours) at 10/19/2018 0757 Last data filed at 10/19/2018 0400 Gross per 24 hour  Intake 1044.95 ml  Output 1425 ml  Net -380.05 ml     Assessment/Plan:  68 y.o. male is s/p R IJ TDC and L 1st stage basilic vein fistula 1 Day Post-Op   Patent L brachiobasilic fistula without signs of steal syndrome L hand Check  fistula duplex in 6 weeks in preparation for second stage basilic transposition Ok for discharge from vascular surgery standpoint   Dagoberto Ligas, PA-C [facility] [phone] 10/19/2018 7:57 AM  I have interviewed the patient and examined the patient. I agree with the findings by the PA.  Excellent thrill in left upper arm fistula (for stage basilic vein transposition)..  Palpable radial pulse.  Arrangement set been made for follow-up in 6 weeks with a duplex.  Vascular surgery will be available as needed.  Gae Gallop, MD [phone]

## 2018-10-21 DIAGNOSIS — Z992 Dependence on renal dialysis: Secondary | ICD-10-CM | POA: Diagnosis not present

## 2018-10-21 DIAGNOSIS — D509 Iron deficiency anemia, unspecified: Secondary | ICD-10-CM | POA: Diagnosis not present

## 2018-10-21 DIAGNOSIS — N186 End stage renal disease: Secondary | ICD-10-CM | POA: Diagnosis not present

## 2018-10-21 DIAGNOSIS — N2581 Secondary hyperparathyroidism of renal origin: Secondary | ICD-10-CM | POA: Diagnosis not present

## 2018-10-21 DIAGNOSIS — D631 Anemia in chronic kidney disease: Secondary | ICD-10-CM | POA: Diagnosis not present

## 2018-10-23 DIAGNOSIS — N186 End stage renal disease: Secondary | ICD-10-CM | POA: Diagnosis not present

## 2018-10-23 DIAGNOSIS — Z992 Dependence on renal dialysis: Secondary | ICD-10-CM | POA: Diagnosis not present

## 2018-10-23 DIAGNOSIS — D509 Iron deficiency anemia, unspecified: Secondary | ICD-10-CM | POA: Diagnosis not present

## 2018-10-23 DIAGNOSIS — N2581 Secondary hyperparathyroidism of renal origin: Secondary | ICD-10-CM | POA: Diagnosis not present

## 2018-10-23 DIAGNOSIS — D631 Anemia in chronic kidney disease: Secondary | ICD-10-CM | POA: Diagnosis not present

## 2018-10-26 DIAGNOSIS — N2581 Secondary hyperparathyroidism of renal origin: Secondary | ICD-10-CM | POA: Diagnosis not present

## 2018-10-26 DIAGNOSIS — Z992 Dependence on renal dialysis: Secondary | ICD-10-CM | POA: Diagnosis not present

## 2018-10-26 DIAGNOSIS — D631 Anemia in chronic kidney disease: Secondary | ICD-10-CM | POA: Diagnosis not present

## 2018-10-26 DIAGNOSIS — N186 End stage renal disease: Secondary | ICD-10-CM | POA: Diagnosis not present

## 2018-10-26 DIAGNOSIS — D509 Iron deficiency anemia, unspecified: Secondary | ICD-10-CM | POA: Diagnosis not present

## 2018-10-28 DIAGNOSIS — D631 Anemia in chronic kidney disease: Secondary | ICD-10-CM | POA: Diagnosis not present

## 2018-10-28 DIAGNOSIS — N138 Other obstructive and reflux uropathy: Secondary | ICD-10-CM | POA: Diagnosis not present

## 2018-10-28 DIAGNOSIS — N2581 Secondary hyperparathyroidism of renal origin: Secondary | ICD-10-CM | POA: Diagnosis not present

## 2018-10-28 DIAGNOSIS — Z992 Dependence on renal dialysis: Secondary | ICD-10-CM | POA: Diagnosis not present

## 2018-10-28 DIAGNOSIS — N186 End stage renal disease: Secondary | ICD-10-CM | POA: Diagnosis not present

## 2018-10-28 DIAGNOSIS — D689 Coagulation defect, unspecified: Secondary | ICD-10-CM | POA: Diagnosis not present

## 2018-10-28 DIAGNOSIS — D509 Iron deficiency anemia, unspecified: Secondary | ICD-10-CM | POA: Diagnosis not present

## 2018-10-28 DIAGNOSIS — Z23 Encounter for immunization: Secondary | ICD-10-CM | POA: Diagnosis not present

## 2018-10-30 DIAGNOSIS — N2581 Secondary hyperparathyroidism of renal origin: Secondary | ICD-10-CM | POA: Diagnosis not present

## 2018-10-30 DIAGNOSIS — D689 Coagulation defect, unspecified: Secondary | ICD-10-CM | POA: Diagnosis not present

## 2018-10-30 DIAGNOSIS — D509 Iron deficiency anemia, unspecified: Secondary | ICD-10-CM | POA: Diagnosis not present

## 2018-10-30 DIAGNOSIS — N186 End stage renal disease: Secondary | ICD-10-CM | POA: Diagnosis not present

## 2018-10-30 DIAGNOSIS — Z992 Dependence on renal dialysis: Secondary | ICD-10-CM | POA: Diagnosis not present

## 2018-10-30 DIAGNOSIS — D631 Anemia in chronic kidney disease: Secondary | ICD-10-CM | POA: Diagnosis not present

## 2018-11-02 DIAGNOSIS — D509 Iron deficiency anemia, unspecified: Secondary | ICD-10-CM | POA: Diagnosis not present

## 2018-11-02 DIAGNOSIS — N186 End stage renal disease: Secondary | ICD-10-CM | POA: Diagnosis not present

## 2018-11-02 DIAGNOSIS — D689 Coagulation defect, unspecified: Secondary | ICD-10-CM | POA: Diagnosis not present

## 2018-11-02 DIAGNOSIS — N2581 Secondary hyperparathyroidism of renal origin: Secondary | ICD-10-CM | POA: Diagnosis not present

## 2018-11-02 DIAGNOSIS — D631 Anemia in chronic kidney disease: Secondary | ICD-10-CM | POA: Diagnosis not present

## 2018-11-02 DIAGNOSIS — Z992 Dependence on renal dialysis: Secondary | ICD-10-CM | POA: Diagnosis not present

## 2018-11-03 ENCOUNTER — Other Ambulatory Visit: Payer: Self-pay

## 2018-11-03 ENCOUNTER — Ambulatory Visit (INDEPENDENT_AMBULATORY_CARE_PROVIDER_SITE_OTHER): Payer: Medicare Other | Admitting: Family Medicine

## 2018-11-03 ENCOUNTER — Encounter: Payer: Self-pay | Admitting: Family Medicine

## 2018-11-03 VITALS — BP 102/61 | HR 84 | Temp 99.0°F | Wt 168.4 lb

## 2018-11-03 DIAGNOSIS — N186 End stage renal disease: Secondary | ICD-10-CM | POA: Diagnosis not present

## 2018-11-03 DIAGNOSIS — N139 Obstructive and reflux uropathy, unspecified: Secondary | ICD-10-CM | POA: Diagnosis not present

## 2018-11-03 DIAGNOSIS — D649 Anemia, unspecified: Secondary | ICD-10-CM | POA: Diagnosis not present

## 2018-11-03 DIAGNOSIS — Z992 Dependence on renal dialysis: Secondary | ICD-10-CM | POA: Diagnosis not present

## 2018-11-03 NOTE — Progress Notes (Signed)
Subjective:    Patient ID: Matthew [name], male    DOB: 1950-03-06, 68 y.o.   MRN: [id number]  HPI Matthew [name] is a 68 y.o. male Presents today for: Chief Complaint  Patient presents with  . Hospitalization Follow-up    was seen in the [facility] 10/12/18 was there for a week. Have not been having any problem since got out of the hospital. He is currently taking dialysis at the moment. He see them 3 times a week.    Here for hospital follow up: Admitted. 9/15- 10/19/18  CKD AKI on CKD - stage 5 with progression to ESRD.  Started on hemodialysis with second session on 9/22.  Plan for BMP/CBC in 1 week, OP follow upo with vascular surgery, nephrology, and dialysis.  Metabolic acidosis improved with bicarb.  Calcitriol and calcium carbonate for hypocalcemia.  Prior obstructive uropathy, with acute urinary retention on admission. CT renal stone study without hydronephrosis. Initial foley catheter.  Last HD yesterday, no missed appts. Intermittent cath has been working ok. No specific follow up with urology at this time. Last appt in July with 6 month follow up planned. Nephrologist eval at HD - not sure of his name.   Memory is better, feeling better.  Has been using 4 pt rolling walker - per recommendation from PT [id number] eval.   Complicated UTI: Staph epidermidis on cx. Treated with Ancef. No abd pain, fever, dysuria.   Anemia: Thought secondary to CKD.  1unit PRBC on 9/15, treated with darbopoetin. . Improving in hospital. Continuing to take folic acid and thiamine. No alcohol since left hospital. Denies lightheadedness/dizziness.   Lab Results  Component Value Date   WBC 8.9 10/19/2018   HGB 8.6 (L) 10/19/2018   HCT 28.4 (L) 10/19/2018   MCV 97.3 10/19/2018   PLT 297 10/19/2018   Hypertension: BP Readings from Last 3 Encounters:  11/03/18 102/61  10/19/18 126/74  10/11/18 (!) 149/76   Lab Results  Component Value Date   CREATININE 7.76 (H) 10/19/2018   Controlled  with HD. Prior alcohol abuse, no signs of withdrawal in hospital. No alcohol since   Patient Active Problem List   Diagnosis Date Noted  . Acute metabolic encephalopathy [id number]  . Suspected COVID-19 virus infection 05/04/2018  . Fall 05/03/2018  . Syncope 05/03/2018  . Acute uremia 04/27/2018  . AKI (acute kidney injury) ([place]) 04/27/2018  . Acute on chronic renal failure ([place]) 04/26/2018  . Alcohol abuse 04/26/2018  . Normocytic anemia 04/26/2018  . Metabolic acidosis [id number]  . Acute lower UTI 04/26/2018  . Chronic renal failure, stage 4 (severe) ([place]) 08/01/2017  . Anemia, chronic renal failure, stage 4 (severe) ([place]) 08/01/2017  . DOE (dyspnea on exertion) 07/31/2017   Past Medical History:  Diagnosis Date  . Diabetes ([place])   . ESRD (end stage renal disease) ([place]) 04/26/2018  . Essential hypertension    Past Surgical History:  Procedure Laterality Date  . AV FISTULA PLACEMENT Left 10/18/2018   Procedure: First Stage Basilic Transposition.;  Surgeon: Angelia Mould, MD;  Location: [facility] OR;  Service: Vascular;  Laterality: Left;  . finger lanced    . INSERTION OF DIALYSIS CATHETER Right 10/18/2018   Procedure: INSERTION OF Right Internal Jugular DIALYSIS CATHETER;  Surgeon: Angelia Mould, MD;  Location: [facility] OR;  Service: Vascular;  Laterality: Right;   No Known Allergies Prior to Admission medications   Medication Sig Start Date End Date Taking? Authorizing Provider  aspirin 325 MG tablet Take 325 mg  by mouth as needed for mild pain (thin blood).   Yes [provider]  calcitRIOL (ROCALTROL) 0.5 MCG capsule Take 1 capsule (0.5 mcg total) by mouth daily. 10/19/18  Yes Ghimire, Henreitta Leber, MD  calcium carbonate (TUMS - DOSED IN MG ELEMENTAL CALCIUM) 500 MG chewable tablet Chew 2 tablets (400 mg of elemental calcium total) by mouth 3 (three) times daily. 10/19/18 11/18/18 Yes Ghimire, Henreitta Leber, MD  folic acid (FOLVITE) 1 MG tablet Take 1 tablet (1 mg  total) by mouth daily. 05/07/18  Yes Sheikh, Omair Latif, DO  Multiple Vitamin (MULTIVITAMIN WITH MINERALS) TABS tablet Take 1 tablet by mouth daily. 05/07/18  Yes Sheikh, Omair Latif, DO  thiamine 100 MG tablet Take 1 tablet (100 mg total) by mouth daily. 05/07/18  Yes Kerney Elbe, DO   Social History   Socioeconomic History  . Marital status: Single    Spouse name: Not on file  . Number of children: Not on file  . Years of education: Not on file  . Highest education level: Not on file  Occupational History  . Occupation: dietician  Social Needs  . Financial resource strain: Not on file  . Food insecurity    Worry: Not on file    Inability: Not on file  . Transportation needs    Medical: Not on file    Non-medical: Not on file  Tobacco Use  . Smoking status: Former Smoker    Packs/day: 0.25    Years: 1.00    Pack years: 0.25    Types: Cigarettes    Quit date: 01/27/1997    Years since quitting: 21.7  . Smokeless tobacco: Never Used  Substance and Sexual Activity  . Alcohol use: Yes    Alcohol/week: 10.0 - 12.0 standard drinks    Types: 10 - 12 Cans of beer per week    Comment: "I mean I drinks, but not like that", denies every day  . Drug use: Yes    Types: Marijuana  . Sexual activity: Yes  Lifestyle  . Physical activity    Days per week: Not on file    Minutes per session: Not on file  . Stress: Not on file  Relationships  . Social Herbalist on phone: Not on file    Gets together: Not on file    Attends religious service: Not on file    Active member of club or organization: Not on file    Attends meetings of clubs or organizations: Not on file    Relationship status: Not on file  . Intimate partner violence    Fear of current or ex partner: Not on file    Emotionally abused: Not on file    Physically abused: Not on file    Forced sexual activity: Not on file  Other Topics Concern  . Not on file  Social History Narrative  . Not on file     Review of Systems Per HPI.     Objective:   Physical Exam Vitals signs reviewed.  Constitutional:      Appearance: He is well-developed.  HENT:     Head: Normocephalic and atraumatic.  Eyes:     Pupils: Pupils are equal, round, and reactive to light.  Neck:     Vascular: No carotid bruit or JVD.  Cardiovascular:     Rate and Rhythm: Normal rate and regular rhythm.     Heart sounds: Normal heart sounds. No murmur.  Pulmonary:  Effort: Pulmonary effort is normal.     Breath sounds: Normal breath sounds. No rales.  Abdominal:     General: There is no distension.     Tenderness: There is no abdominal tenderness. There is no guarding.  Skin:    General: Skin is warm and dry.  Neurological:     Mental Status: He is alert and oriented to person, place, and time.  Psychiatric:        Mood and Affect: Mood normal.        Behavior: Behavior normal.        Thought Content: Thought content normal.    Vitals:   11/03/18 1648  BP: 102/61  Pulse: 84  Temp: 99 F (37.2 C)  TempSrc: Oral  SpO2: 98%  Weight: 168 lb 6.4 oz (76.4 kg)    10 min chart review     Assessment & Plan:  Kaicen Mccune is a 68 y.o. male Stage 5 chronic kidney disease on chronic dialysis ([place]) - Plan: Basic metabolic panel  -New start of hemodialysis, compliant.  Overall symptomatically improved.  Check BMP as recommended from hospital follow-up.  Close follow-up with nephrology.   Anemia, unspecified type - Plan: CBC  -Symptomatically stable, suspected anemia of chronic kidney disease.  Check CBC for stability.  Commended on Alcohol cessation.  Obstructive uropathy - Plan: Basic metabolic panel  -Tolerating in and out cath.  Obstructive uropathy noted from hospitalization.  Will have him follow-up with nephrologist as would be due for follow-up visit next month anyway.  No orders of the defined types were placed in this encounter.  Patient Instructions    Call Dr. Louis Meckel to schedule follow up  of prostate/obstruction. [id number] No change in meds. Keep follow up with your specialists. Recheck with me in 6 weeks.  Return to the clinic or go to the nearest emergency room if any of your symptoms worsen or new symptoms occur.   If you have lab work done today you will be contacted with your lab results within the next 2 weeks.  If you have not heard from [place] then please contact us. The fastest way to get your results is to register for My Chart.   IF you received an x-ray today, you will receive an invoice from [facility]. Please contact [facility] Radiology at [phone] with questions or concerns regarding your invoice.   IF you received labwork today, you will receive an invoice from [place]. Please contact [facility] at [phone] with questions or concerns regarding your invoice.   Our billing staff will not be able to assist you with questions regarding bills from these companies.  You will be contacted with the lab results as soon as they are available. The fastest way to get your results is to activate your My Chart account. Instructions are located on the last page of this paperwork. If you have not heard from [place] regarding the results in 2 weeks, please contact this office.       Signed,   Merri Ray, MD [facility] [place].  11/03/18 7:17 PM

## 2018-11-03 NOTE — Patient Instructions (Addendum)
  Call Dr. Louis Meckel to schedule follow up of prostate/obstruction. [id number] No change in meds. Keep follow up with your specialists. Recheck with me in 6 weeks.  Return to the clinic or go to the nearest emergency room if any of your symptoms worsen or new symptoms occur.   If you have lab work done today you will be contacted with your lab results within the next 2 weeks.  If you have not heard from [place] then please contact us. The fastest way to get your results is to register for My Chart.   IF you received an x-ray today, you will receive an invoice from [facility]. Please contact [facility] Radiology at [phone] with questions or concerns regarding your invoice.   IF you received labwork today, you will receive an invoice from [place]. Please contact [facility] at [phone] with questions or concerns regarding your invoice.   Our billing staff will not be able to assist you with questions regarding bills from these companies.  You will be contacted with the lab results as soon as they are available. The fastest way to get your results is to activate your My Chart account. Instructions are located on the last page of this paperwork. If you have not heard from [place] regarding the results in 2 weeks, please contact this office.

## 2018-11-04 DIAGNOSIS — Z992 Dependence on renal dialysis: Secondary | ICD-10-CM | POA: Diagnosis not present

## 2018-11-04 DIAGNOSIS — N2581 Secondary hyperparathyroidism of renal origin: Secondary | ICD-10-CM | POA: Diagnosis not present

## 2018-11-04 DIAGNOSIS — D689 Coagulation defect, unspecified: Secondary | ICD-10-CM | POA: Diagnosis not present

## 2018-11-04 DIAGNOSIS — D631 Anemia in chronic kidney disease: Secondary | ICD-10-CM | POA: Diagnosis not present

## 2018-11-04 DIAGNOSIS — N186 End stage renal disease: Secondary | ICD-10-CM | POA: Diagnosis not present

## 2018-11-04 DIAGNOSIS — D509 Iron deficiency anemia, unspecified: Secondary | ICD-10-CM | POA: Diagnosis not present

## 2018-11-04 LAB — BASIC METABOLIC PANEL
BUN/Creatinine Ratio: 7 — ABNORMAL LOW (ref 10–24)
BUN: 49 mg/dL — ABNORMAL HIGH (ref 8–27)
CO2: 24 mmol/L (ref 20–29)
Calcium: 7.5 mg/dL — ABNORMAL LOW (ref 8.6–10.2)
Chloride: 94 mmol/L — ABNORMAL LOW (ref 96–106)
Creatinine, Ser: 6.81 mg/dL (ref 0.76–1.27)
GFR calc Af Amer: 9 mL/min/{1.73_m2} — ABNORMAL LOW (ref >59)
GFR calc non Af Amer: 8 mL/min/{1.73_m2} — ABNORMAL LOW (ref >59)
Glucose: 107 mg/dL — ABNORMAL HIGH (ref 65–99)
Potassium: 3.8 mmol/L (ref 3.5–5.2)
Sodium: 137 mmol/L (ref 134–144)

## 2018-11-04 LAB — CBC
Hematocrit: 29.4 % — ABNORMAL LOW (ref 37.5–51.0)
Hemoglobin: 9.8 g/dL — ABNORMAL LOW (ref 13.0–17.7)
MCH: 30.6 pg (ref 26.6–33.0)
MCHC: 33.3 g/dL (ref 31.5–35.7)
MCV: 92 fL (ref 79–97)
Platelets: 222 10*3/uL (ref 150–450)
RBC: 3.2 x10E6/uL — ABNORMAL LOW (ref 4.14–5.80)
RDW: 14.3 % (ref 11.6–15.4)
WBC: 5.9 10*3/uL (ref 3.4–10.8)

## 2018-11-06 DIAGNOSIS — N186 End stage renal disease: Secondary | ICD-10-CM | POA: Diagnosis not present

## 2018-11-06 DIAGNOSIS — D631 Anemia in chronic kidney disease: Secondary | ICD-10-CM | POA: Diagnosis not present

## 2018-11-06 DIAGNOSIS — D689 Coagulation defect, unspecified: Secondary | ICD-10-CM | POA: Diagnosis not present

## 2018-11-06 DIAGNOSIS — D509 Iron deficiency anemia, unspecified: Secondary | ICD-10-CM | POA: Diagnosis not present

## 2018-11-06 DIAGNOSIS — Z992 Dependence on renal dialysis: Secondary | ICD-10-CM | POA: Diagnosis not present

## 2018-11-06 DIAGNOSIS — N2581 Secondary hyperparathyroidism of renal origin: Secondary | ICD-10-CM | POA: Diagnosis not present

## 2018-11-09 DIAGNOSIS — N186 End stage renal disease: Secondary | ICD-10-CM | POA: Diagnosis not present

## 2018-11-09 DIAGNOSIS — D689 Coagulation defect, unspecified: Secondary | ICD-10-CM | POA: Diagnosis not present

## 2018-11-09 DIAGNOSIS — D631 Anemia in chronic kidney disease: Secondary | ICD-10-CM | POA: Diagnosis not present

## 2018-11-09 DIAGNOSIS — Z992 Dependence on renal dialysis: Secondary | ICD-10-CM | POA: Diagnosis not present

## 2018-11-09 DIAGNOSIS — N2581 Secondary hyperparathyroidism of renal origin: Secondary | ICD-10-CM | POA: Diagnosis not present

## 2018-11-09 DIAGNOSIS — D509 Iron deficiency anemia, unspecified: Secondary | ICD-10-CM | POA: Diagnosis not present

## 2018-11-10 ENCOUNTER — Ambulatory Visit: Payer: Medicare Other | Admitting: Family Medicine

## 2018-11-11 DIAGNOSIS — N2581 Secondary hyperparathyroidism of renal origin: Secondary | ICD-10-CM | POA: Diagnosis not present

## 2018-11-11 DIAGNOSIS — D689 Coagulation defect, unspecified: Secondary | ICD-10-CM | POA: Diagnosis not present

## 2018-11-11 DIAGNOSIS — D631 Anemia in chronic kidney disease: Secondary | ICD-10-CM | POA: Diagnosis not present

## 2018-11-11 DIAGNOSIS — N186 End stage renal disease: Secondary | ICD-10-CM | POA: Diagnosis not present

## 2018-11-11 DIAGNOSIS — D509 Iron deficiency anemia, unspecified: Secondary | ICD-10-CM | POA: Diagnosis not present

## 2018-11-11 DIAGNOSIS — Z992 Dependence on renal dialysis: Secondary | ICD-10-CM | POA: Diagnosis not present

## 2018-11-13 DIAGNOSIS — D689 Coagulation defect, unspecified: Secondary | ICD-10-CM | POA: Diagnosis not present

## 2018-11-13 DIAGNOSIS — Z992 Dependence on renal dialysis: Secondary | ICD-10-CM | POA: Diagnosis not present

## 2018-11-13 DIAGNOSIS — D509 Iron deficiency anemia, unspecified: Secondary | ICD-10-CM | POA: Diagnosis not present

## 2018-11-13 DIAGNOSIS — N186 End stage renal disease: Secondary | ICD-10-CM | POA: Diagnosis not present

## 2018-11-13 DIAGNOSIS — N2581 Secondary hyperparathyroidism of renal origin: Secondary | ICD-10-CM | POA: Diagnosis not present

## 2018-11-13 DIAGNOSIS — D631 Anemia in chronic kidney disease: Secondary | ICD-10-CM | POA: Diagnosis not present

## 2018-11-16 DIAGNOSIS — D509 Iron deficiency anemia, unspecified: Secondary | ICD-10-CM | POA: Diagnosis not present

## 2018-11-16 DIAGNOSIS — N186 End stage renal disease: Secondary | ICD-10-CM | POA: Diagnosis not present

## 2018-11-16 DIAGNOSIS — Z992 Dependence on renal dialysis: Secondary | ICD-10-CM | POA: Diagnosis not present

## 2018-11-16 DIAGNOSIS — N2581 Secondary hyperparathyroidism of renal origin: Secondary | ICD-10-CM | POA: Diagnosis not present

## 2018-11-16 DIAGNOSIS — D689 Coagulation defect, unspecified: Secondary | ICD-10-CM | POA: Diagnosis not present

## 2018-11-16 DIAGNOSIS — D631 Anemia in chronic kidney disease: Secondary | ICD-10-CM | POA: Diagnosis not present

## 2018-11-18 DIAGNOSIS — N2581 Secondary hyperparathyroidism of renal origin: Secondary | ICD-10-CM | POA: Diagnosis not present

## 2018-11-18 DIAGNOSIS — Z992 Dependence on renal dialysis: Secondary | ICD-10-CM | POA: Diagnosis not present

## 2018-11-18 DIAGNOSIS — N186 End stage renal disease: Secondary | ICD-10-CM | POA: Diagnosis not present

## 2018-11-18 DIAGNOSIS — D509 Iron deficiency anemia, unspecified: Secondary | ICD-10-CM | POA: Diagnosis not present

## 2018-11-18 DIAGNOSIS — D689 Coagulation defect, unspecified: Secondary | ICD-10-CM | POA: Diagnosis not present

## 2018-11-18 DIAGNOSIS — D631 Anemia in chronic kidney disease: Secondary | ICD-10-CM | POA: Diagnosis not present

## 2018-11-20 DIAGNOSIS — Z992 Dependence on renal dialysis: Secondary | ICD-10-CM | POA: Diagnosis not present

## 2018-11-20 DIAGNOSIS — D631 Anemia in chronic kidney disease: Secondary | ICD-10-CM | POA: Diagnosis not present

## 2018-11-20 DIAGNOSIS — N186 End stage renal disease: Secondary | ICD-10-CM | POA: Diagnosis not present

## 2018-11-20 DIAGNOSIS — N2581 Secondary hyperparathyroidism of renal origin: Secondary | ICD-10-CM | POA: Diagnosis not present

## 2018-11-20 DIAGNOSIS — D509 Iron deficiency anemia, unspecified: Secondary | ICD-10-CM | POA: Diagnosis not present

## 2018-11-20 DIAGNOSIS — D689 Coagulation defect, unspecified: Secondary | ICD-10-CM | POA: Diagnosis not present

## 2018-11-23 DIAGNOSIS — Z992 Dependence on renal dialysis: Secondary | ICD-10-CM | POA: Diagnosis not present

## 2018-11-23 DIAGNOSIS — N186 End stage renal disease: Secondary | ICD-10-CM | POA: Diagnosis not present

## 2018-11-23 DIAGNOSIS — D631 Anemia in chronic kidney disease: Secondary | ICD-10-CM | POA: Diagnosis not present

## 2018-11-23 DIAGNOSIS — N2581 Secondary hyperparathyroidism of renal origin: Secondary | ICD-10-CM | POA: Diagnosis not present

## 2018-11-23 DIAGNOSIS — D689 Coagulation defect, unspecified: Secondary | ICD-10-CM | POA: Diagnosis not present

## 2018-11-23 DIAGNOSIS — D509 Iron deficiency anemia, unspecified: Secondary | ICD-10-CM | POA: Diagnosis not present

## 2018-11-25 ENCOUNTER — Other Ambulatory Visit: Payer: Self-pay

## 2018-11-25 DIAGNOSIS — D509 Iron deficiency anemia, unspecified: Secondary | ICD-10-CM | POA: Diagnosis not present

## 2018-11-25 DIAGNOSIS — D689 Coagulation defect, unspecified: Secondary | ICD-10-CM | POA: Diagnosis not present

## 2018-11-25 DIAGNOSIS — Z992 Dependence on renal dialysis: Secondary | ICD-10-CM | POA: Diagnosis not present

## 2018-11-25 DIAGNOSIS — N179 Acute kidney failure, unspecified: Secondary | ICD-10-CM

## 2018-11-25 DIAGNOSIS — N186 End stage renal disease: Secondary | ICD-10-CM | POA: Diagnosis not present

## 2018-11-25 DIAGNOSIS — D631 Anemia in chronic kidney disease: Secondary | ICD-10-CM | POA: Diagnosis not present

## 2018-11-25 DIAGNOSIS — N2581 Secondary hyperparathyroidism of renal origin: Secondary | ICD-10-CM | POA: Diagnosis not present

## 2018-11-27 DIAGNOSIS — D509 Iron deficiency anemia, unspecified: Secondary | ICD-10-CM | POA: Diagnosis not present

## 2018-11-27 DIAGNOSIS — D689 Coagulation defect, unspecified: Secondary | ICD-10-CM | POA: Diagnosis not present

## 2018-11-27 DIAGNOSIS — N186 End stage renal disease: Secondary | ICD-10-CM | POA: Diagnosis not present

## 2018-11-27 DIAGNOSIS — D631 Anemia in chronic kidney disease: Secondary | ICD-10-CM | POA: Diagnosis not present

## 2018-11-27 DIAGNOSIS — Z992 Dependence on renal dialysis: Secondary | ICD-10-CM | POA: Diagnosis not present

## 2018-11-27 DIAGNOSIS — N2581 Secondary hyperparathyroidism of renal origin: Secondary | ICD-10-CM | POA: Diagnosis not present

## 2018-11-28 DIAGNOSIS — Z992 Dependence on renal dialysis: Secondary | ICD-10-CM | POA: Diagnosis not present

## 2018-11-28 DIAGNOSIS — N138 Other obstructive and reflux uropathy: Secondary | ICD-10-CM | POA: Diagnosis not present

## 2018-11-28 DIAGNOSIS — N186 End stage renal disease: Secondary | ICD-10-CM | POA: Diagnosis not present

## 2018-11-30 DIAGNOSIS — D631 Anemia in chronic kidney disease: Secondary | ICD-10-CM | POA: Diagnosis not present

## 2018-11-30 DIAGNOSIS — N2581 Secondary hyperparathyroidism of renal origin: Secondary | ICD-10-CM | POA: Diagnosis not present

## 2018-11-30 DIAGNOSIS — Z23 Encounter for immunization: Secondary | ICD-10-CM | POA: Diagnosis not present

## 2018-11-30 DIAGNOSIS — Z992 Dependence on renal dialysis: Secondary | ICD-10-CM | POA: Diagnosis not present

## 2018-11-30 DIAGNOSIS — N186 End stage renal disease: Secondary | ICD-10-CM | POA: Diagnosis not present

## 2018-11-30 DIAGNOSIS — D509 Iron deficiency anemia, unspecified: Secondary | ICD-10-CM | POA: Diagnosis not present

## 2018-12-01 ENCOUNTER — Encounter: Payer: Medicare Other | Admitting: Vascular Surgery

## 2018-12-01 ENCOUNTER — Inpatient Hospital Stay (HOSPITAL_COMMUNITY): Admit: 2018-12-01 | Payer: Medicare Other

## 2018-12-02 ENCOUNTER — Telehealth: Payer: Self-pay | Admitting: *Deleted

## 2018-12-02 DIAGNOSIS — D509 Iron deficiency anemia, unspecified: Secondary | ICD-10-CM | POA: Diagnosis not present

## 2018-12-02 DIAGNOSIS — N186 End stage renal disease: Secondary | ICD-10-CM | POA: Diagnosis not present

## 2018-12-02 DIAGNOSIS — N2581 Secondary hyperparathyroidism of renal origin: Secondary | ICD-10-CM | POA: Diagnosis not present

## 2018-12-02 DIAGNOSIS — D631 Anemia in chronic kidney disease: Secondary | ICD-10-CM | POA: Diagnosis not present

## 2018-12-02 DIAGNOSIS — Z23 Encounter for immunization: Secondary | ICD-10-CM | POA: Diagnosis not present

## 2018-12-02 DIAGNOSIS — Z992 Dependence on renal dialysis: Secondary | ICD-10-CM | POA: Diagnosis not present

## 2018-12-02 NOTE — Telephone Encounter (Signed)
Schedule AWV.  

## 2018-12-04 DIAGNOSIS — Z23 Encounter for immunization: Secondary | ICD-10-CM | POA: Diagnosis not present

## 2018-12-04 DIAGNOSIS — D509 Iron deficiency anemia, unspecified: Secondary | ICD-10-CM | POA: Diagnosis not present

## 2018-12-04 DIAGNOSIS — N186 End stage renal disease: Secondary | ICD-10-CM | POA: Diagnosis not present

## 2018-12-04 DIAGNOSIS — Z992 Dependence on renal dialysis: Secondary | ICD-10-CM | POA: Diagnosis not present

## 2018-12-04 DIAGNOSIS — N2581 Secondary hyperparathyroidism of renal origin: Secondary | ICD-10-CM | POA: Diagnosis not present

## 2018-12-04 DIAGNOSIS — D631 Anemia in chronic kidney disease: Secondary | ICD-10-CM | POA: Diagnosis not present

## 2018-12-07 DIAGNOSIS — N186 End stage renal disease: Secondary | ICD-10-CM | POA: Diagnosis not present

## 2018-12-07 DIAGNOSIS — D509 Iron deficiency anemia, unspecified: Secondary | ICD-10-CM | POA: Diagnosis not present

## 2018-12-07 DIAGNOSIS — Z992 Dependence on renal dialysis: Secondary | ICD-10-CM | POA: Diagnosis not present

## 2018-12-07 DIAGNOSIS — D631 Anemia in chronic kidney disease: Secondary | ICD-10-CM | POA: Diagnosis not present

## 2018-12-07 DIAGNOSIS — N2581 Secondary hyperparathyroidism of renal origin: Secondary | ICD-10-CM | POA: Diagnosis not present

## 2018-12-07 DIAGNOSIS — Z23 Encounter for immunization: Secondary | ICD-10-CM | POA: Diagnosis not present

## 2018-12-09 DIAGNOSIS — D509 Iron deficiency anemia, unspecified: Secondary | ICD-10-CM | POA: Diagnosis not present

## 2018-12-09 DIAGNOSIS — N186 End stage renal disease: Secondary | ICD-10-CM | POA: Diagnosis not present

## 2018-12-09 DIAGNOSIS — D631 Anemia in chronic kidney disease: Secondary | ICD-10-CM | POA: Diagnosis not present

## 2018-12-09 DIAGNOSIS — Z992 Dependence on renal dialysis: Secondary | ICD-10-CM | POA: Diagnosis not present

## 2018-12-09 DIAGNOSIS — N2581 Secondary hyperparathyroidism of renal origin: Secondary | ICD-10-CM | POA: Diagnosis not present

## 2018-12-09 DIAGNOSIS — Z23 Encounter for immunization: Secondary | ICD-10-CM | POA: Diagnosis not present

## 2018-12-11 DIAGNOSIS — N2581 Secondary hyperparathyroidism of renal origin: Secondary | ICD-10-CM | POA: Diagnosis not present

## 2018-12-11 DIAGNOSIS — Z992 Dependence on renal dialysis: Secondary | ICD-10-CM | POA: Diagnosis not present

## 2018-12-11 DIAGNOSIS — D509 Iron deficiency anemia, unspecified: Secondary | ICD-10-CM | POA: Diagnosis not present

## 2018-12-11 DIAGNOSIS — N186 End stage renal disease: Secondary | ICD-10-CM | POA: Diagnosis not present

## 2018-12-11 DIAGNOSIS — Z23 Encounter for immunization: Secondary | ICD-10-CM | POA: Diagnosis not present

## 2018-12-11 DIAGNOSIS — D631 Anemia in chronic kidney disease: Secondary | ICD-10-CM | POA: Diagnosis not present

## 2018-12-14 DIAGNOSIS — Z23 Encounter for immunization: Secondary | ICD-10-CM | POA: Diagnosis not present

## 2018-12-14 DIAGNOSIS — N186 End stage renal disease: Secondary | ICD-10-CM | POA: Diagnosis not present

## 2018-12-14 DIAGNOSIS — N2581 Secondary hyperparathyroidism of renal origin: Secondary | ICD-10-CM | POA: Diagnosis not present

## 2018-12-14 DIAGNOSIS — D509 Iron deficiency anemia, unspecified: Secondary | ICD-10-CM | POA: Diagnosis not present

## 2018-12-14 DIAGNOSIS — Z992 Dependence on renal dialysis: Secondary | ICD-10-CM | POA: Diagnosis not present

## 2018-12-14 DIAGNOSIS — D631 Anemia in chronic kidney disease: Secondary | ICD-10-CM | POA: Diagnosis not present

## 2018-12-15 ENCOUNTER — Other Ambulatory Visit: Payer: Self-pay

## 2018-12-15 ENCOUNTER — Ambulatory Visit (INDEPENDENT_AMBULATORY_CARE_PROVIDER_SITE_OTHER): Payer: Medicare Other | Admitting: Family Medicine

## 2018-12-15 VITALS — BP 115/72 | HR 85 | Temp 97.9°F | Ht 70.5 in | Wt 171.0 lb

## 2018-12-15 DIAGNOSIS — Z992 Dependence on renal dialysis: Secondary | ICD-10-CM

## 2018-12-15 DIAGNOSIS — D649 Anemia, unspecified: Secondary | ICD-10-CM

## 2018-12-15 DIAGNOSIS — N139 Obstructive and reflux uropathy, unspecified: Secondary | ICD-10-CM

## 2018-12-15 DIAGNOSIS — Z1211 Encounter for screening for malignant neoplasm of colon: Secondary | ICD-10-CM

## 2018-12-15 DIAGNOSIS — R29898 Other symptoms and signs involving the musculoskeletal system: Secondary | ICD-10-CM

## 2018-12-15 DIAGNOSIS — N186 End stage renal disease: Secondary | ICD-10-CM | POA: Diagnosis not present

## 2018-12-15 DIAGNOSIS — R972 Elevated prostate specific antigen [PSA]: Secondary | ICD-10-CM | POA: Diagnosis not present

## 2018-12-15 NOTE — Progress Notes (Signed)
Subjective:  Patient ID: Matthew [name], male    DOB: 10/31/1950  Age: 68 y.o. MRN: [id number]  CC:  Chief Complaint  Patient presents with  . Follow-up    for anemia, labs were taken today at uro appt, Per daughter, [facility] assessed the use of the wallker a week after he got out of the hospital. Says he is doing fine eith the use of his walker. Pt would like to discuss how long he would possibly have to get dialysis.  He is interested in recieving the cologuard    HPI Matthew [name] presents for   Anemia with chronic kidney disease on dialysis. Suspected anemia of chronic disease, symptomatically was stable at October 7 visit.  Commended on alcohol cessation at that time.  Had new start of hemodialysis for stage V chronic kidney disease.  Was tolerating in and out cath for obstructive uropathy at that time as well.  Recommended follow-up with urology as well as nephrology as planned.  Undergoing HD 3 times per week. Unsure of who his nephrologist is.  Denies lightheadedness or dizziness.  Still using CIC - cath for UOP. Going well. No dysuria/abd pain/fever. Urologist: Dr. Louis Meckel - appt today for bloodwork only.  Using rolling walker per recommendation of September 22nd physical therapy eval. Using at home or with walks. Feels like it helps stability. On home exercise program, not on formal PT currently. Considering using a cane - would like Rx for walking cane - 4 point cane.  Last PT treatment 3 weeks ago- [facility].  [phone]. [facility] - Sharyn Lull.   Lab Results  Component Value Date   WBC 5.9 11/03/2018   HGB 9.8 (L) 11/03/2018   HCT 29.4 (L) 11/03/2018   MCV 92 11/03/2018   PLT 222 11/03/2018    History Patient Active Problem List   Diagnosis Date Noted  . Acute metabolic encephalopathy [id number]  . Suspected COVID-19 virus infection 05/04/2018  . Fall 05/03/2018  . Syncope 05/03/2018  . Acute uremia 04/27/2018  . AKI (acute kidney injury) ([place])  04/27/2018  . Acute on chronic renal failure ([place]) 04/26/2018  . Alcohol abuse 04/26/2018  . Normocytic anemia 04/26/2018  . Metabolic acidosis [id number]  . Acute lower UTI 04/26/2018  . Chronic renal failure, stage 4 (severe) ([place]) 08/01/2017  . Anemia, chronic renal failure, stage 4 (severe) ([place]) 08/01/2017  . DOE (dyspnea on exertion) 07/31/2017   Past Medical History:  Diagnosis Date  . Diabetes ([place])   . ESRD (end stage renal disease) ([place]) 04/26/2018  . Essential hypertension    Past Surgical History:  Procedure Laterality Date  . AV FISTULA PLACEMENT Left 10/18/2018   Procedure: First Stage Basilic Transposition.;  Surgeon: Angelia Mould, MD;  Location: [facility] OR;  Service: Vascular;  Laterality: Left;  . finger lanced    . INSERTION OF DIALYSIS CATHETER Right 10/18/2018   Procedure: INSERTION OF Right Internal Jugular DIALYSIS CATHETER;  Surgeon: Angelia Mould, MD;  Location: [facility] OR;  Service: Vascular;  Laterality: Right;   No Known Allergies Prior to Admission medications   Medication Sig Start Date End Date Taking? Authorizing Provider  aspirin 325 MG tablet Take 325 mg by mouth as needed for mild pain (thin blood).   Yes [provider]  calcitRIOL (ROCALTROL) 0.5 MCG capsule Take 1 capsule (0.5 mcg total) by mouth daily. 10/19/18  Yes Ghimire, Henreitta Leber, MD  folic acid (FOLVITE) 1 MG tablet Take 1 tablet (1 mg total) by mouth daily. 05/07/18  Yes Sheikh, Omair Latif, DO  Multiple Vitamin (MULTIVITAMIN WITH MINERALS) TABS tablet Take 1 tablet by mouth daily. 05/07/18  Yes Sheikh, Omair Latif, DO  thiamine 100 MG tablet Take 1 tablet (100 mg total) by mouth daily. 05/07/18  Yes Sheikh, Omair Latif, DO  VELPHORO 500 MG chewable tablet Take 2 tablet by mouth three times a day with meals and 1 BID prn with snacks 10/26/18  Yes [provider]   Social History   Socioeconomic History  . Marital status: Single    Spouse name: Not on file  .  Number of children: Not on file  . Years of education: Not on file  . Highest education level: Not on file  Occupational History  . Occupation: dietician  Tobacco Use  . Smoking status: Former Smoker    Packs/day: 0.25    Years: 1.00    Pack years: 0.25    Types: Cigarettes    Quit date: 01/27/1997    Years since quitting: 22.0  . Smokeless tobacco: Never Used  Substance and Sexual Activity  . Alcohol use: Yes    Alcohol/week: 10.0 - 12.0 standard drinks    Types: 10 - 12 Cans of beer per week    Comment: "I mean I drinks, but not like that", denies every day  . Drug use: Yes    Types: Marijuana  . Sexual activity: Yes  Other Topics Concern  . Not on file  Social History Narrative  . Not on file   Social Determinants of Health   Financial Resource Strain:   . Difficulty of Paying Living Expenses: Not on file  Food Insecurity:   . Worried About Charity fundraiser in the Last Year: Not on file  . Ran Out of Food in the Last Year: Not on file  Transportation Needs:   . Lack of Transportation (Medical): Not on file  . Lack of Transportation (Non-Medical): Not on file  Physical Activity:   . Days of Exercise per Week: Not on file  . Minutes of Exercise per Session: Not on file  Stress:   . Feeling of Stress : Not on file  Social Connections:   . Frequency of Communication with Friends and Family: Not on file  . Frequency of Social Gatherings with Friends and Family: Not on file  . Attends Religious Services: Not on file  . Active Member of Clubs or Organizations: Not on file  . Attends Archivist Meetings: Not on file  . Marital Status: Not on file  Intimate Partner Violence:   . Fear of Current or Ex-Partner: Not on file  . Emotionally Abused: Not on file  . Physically Abused: Not on file  . Sexually Abused: Not on file    Review of Systems Per HPI.   Objective:   Vitals:   12/15/18 1726  BP: 115/72  Pulse: 85  Temp: 97.9 F (36.6 C)  SpO2: 97%   Weight: 171 lb (77.6 kg)  Height: 5' 10.5" (1.791 m)        Physical Exam Vitals signs reviewed.  Constitutional:      Appearance: He is well-developed.  HENT:     Head: Normocephalic and atraumatic.  Eyes:     Pupils: Pupils are equal, round, and reactive to light.  Neck:     Vascular: No carotid bruit or JVD.  Cardiovascular:     Rate and Rhythm: Normal rate and regular rhythm.     Heart sounds: Normal heart sounds. No  murmur.  Pulmonary:     Effort: Pulmonary effort is normal.     Breath sounds: Normal breath sounds. No rales.  Abdominal:     Tenderness: There is no abdominal tenderness. There is no right CVA tenderness or left CVA tenderness.  Skin:    General: Skin is warm and dry.  Neurological:     Mental Status: He is alert and oriented to person, place, and time.      Assessment & Plan:  Matthew [name] is a 68 y.o. male . Stage 5 chronic kidney disease on chronic dialysis ([place]) - Plan: CBC  -Chronic kidney disease on hemodialysis.  Follow-up with nephrologist to discuss any timing of dialysis but discussed long term likely.  Anemia, unspecified type - Plan: CBC  Obstructive uropathy  -Stable with intermittent catheterization, has close follow-up with urology.  Screen for colon cancer - Plan: Cologuard  Muscular deconditioning  -Walking cane at home, physical therapy in place.  No orders of the defined types were placed in this encounter.  Patient Instructions    Ask your providers as dialysis who your primary nephrologist is. I would recommend discussing any dialysis questions with your nephrologist.   I would like to discuss walking cane with your physical therapist to see if that is advisable.       If you have lab work done today you will be contacted with your lab results within the next 2 weeks.  If you have not heard from [place] then please contact us. The fastest way to get your results is to register for My Chart.   IF you received an  x-ray today, you will receive an invoice from [facility] Radiology. Please contact [facility] Radiology at [phone] with questions or concerns regarding your invoice.   IF you received labwork today, you will receive an invoice from [place]. Please contact [facility] at [phone] with questions or concerns regarding your invoice.   Our billing staff will not be able to assist you with questions regarding bills from these companies.  You will be contacted with the lab results as soon as they are available. The fastest way to get your results is to activate your My Chart account. Instructions are located on the last page of this paperwork. If you have not heard from [place] regarding the results in 2 weeks, please contact this office.         Signed, Merri Ray, MD [facility]

## 2018-12-15 NOTE — Patient Instructions (Addendum)
  Ask your providers as dialysis who your primary nephrologist is. I would recommend discussing any dialysis questions with your nephrologist.   I would like to discuss walking cane with your physical therapist to see if that is advisable.       If you have lab work done today you will be contacted with your lab results within the next 2 weeks.  If you have not heard from [place] then please contact us. The fastest way to get your results is to register for My Chart.   IF you received an x-ray today, you will receive an invoice from [facility] Radiology. Please contact [facility] Radiology at [phone] with questions or concerns regarding your invoice.   IF you received labwork today, you will receive an invoice from [place]. Please contact [facility] at [phone] with questions or concerns regarding your invoice.   Our billing staff will not be able to assist you with questions regarding bills from these companies.  You will be contacted with the lab results as soon as they are available. The fastest way to get your results is to activate your My Chart account. Instructions are located on the last page of this paperwork. If you have not heard from [place] regarding the results in 2 weeks, please contact this office.

## 2018-12-16 ENCOUNTER — Telehealth: Payer: Self-pay | Admitting: Family Medicine

## 2018-12-16 DIAGNOSIS — Z992 Dependence on renal dialysis: Secondary | ICD-10-CM | POA: Diagnosis not present

## 2018-12-16 DIAGNOSIS — D631 Anemia in chronic kidney disease: Secondary | ICD-10-CM | POA: Diagnosis not present

## 2018-12-16 DIAGNOSIS — Z23 Encounter for immunization: Secondary | ICD-10-CM | POA: Diagnosis not present

## 2018-12-16 DIAGNOSIS — N2581 Secondary hyperparathyroidism of renal origin: Secondary | ICD-10-CM | POA: Diagnosis not present

## 2018-12-16 DIAGNOSIS — D509 Iron deficiency anemia, unspecified: Secondary | ICD-10-CM | POA: Diagnosis not present

## 2018-12-16 DIAGNOSIS — N186 End stage renal disease: Secondary | ICD-10-CM | POA: Diagnosis not present

## 2018-12-16 NOTE — Telephone Encounter (Signed)
They are sending over fax for Dr,Greene to sign off on . urgent

## 2018-12-18 DIAGNOSIS — Z992 Dependence on renal dialysis: Secondary | ICD-10-CM | POA: Diagnosis not present

## 2018-12-18 DIAGNOSIS — N2581 Secondary hyperparathyroidism of renal origin: Secondary | ICD-10-CM | POA: Diagnosis not present

## 2018-12-18 DIAGNOSIS — Z23 Encounter for immunization: Secondary | ICD-10-CM | POA: Diagnosis not present

## 2018-12-18 DIAGNOSIS — D631 Anemia in chronic kidney disease: Secondary | ICD-10-CM | POA: Diagnosis not present

## 2018-12-18 DIAGNOSIS — D509 Iron deficiency anemia, unspecified: Secondary | ICD-10-CM | POA: Diagnosis not present

## 2018-12-18 DIAGNOSIS — N186 End stage renal disease: Secondary | ICD-10-CM | POA: Diagnosis not present

## 2018-12-20 DIAGNOSIS — N2581 Secondary hyperparathyroidism of renal origin: Secondary | ICD-10-CM | POA: Diagnosis not present

## 2018-12-20 DIAGNOSIS — Z992 Dependence on renal dialysis: Secondary | ICD-10-CM | POA: Diagnosis not present

## 2018-12-20 DIAGNOSIS — D631 Anemia in chronic kidney disease: Secondary | ICD-10-CM | POA: Diagnosis not present

## 2018-12-20 DIAGNOSIS — N186 End stage renal disease: Secondary | ICD-10-CM | POA: Diagnosis not present

## 2018-12-20 DIAGNOSIS — D509 Iron deficiency anemia, unspecified: Secondary | ICD-10-CM | POA: Diagnosis not present

## 2018-12-20 DIAGNOSIS — Z23 Encounter for immunization: Secondary | ICD-10-CM | POA: Diagnosis not present

## 2018-12-22 DIAGNOSIS — Z992 Dependence on renal dialysis: Secondary | ICD-10-CM | POA: Diagnosis not present

## 2018-12-22 DIAGNOSIS — D509 Iron deficiency anemia, unspecified: Secondary | ICD-10-CM | POA: Diagnosis not present

## 2018-12-22 DIAGNOSIS — N2581 Secondary hyperparathyroidism of renal origin: Secondary | ICD-10-CM | POA: Diagnosis not present

## 2018-12-22 DIAGNOSIS — Z23 Encounter for immunization: Secondary | ICD-10-CM | POA: Diagnosis not present

## 2018-12-22 DIAGNOSIS — D631 Anemia in chronic kidney disease: Secondary | ICD-10-CM | POA: Diagnosis not present

## 2018-12-22 DIAGNOSIS — N186 End stage renal disease: Secondary | ICD-10-CM | POA: Diagnosis not present

## 2018-12-25 DIAGNOSIS — N186 End stage renal disease: Secondary | ICD-10-CM | POA: Diagnosis not present

## 2018-12-25 DIAGNOSIS — D509 Iron deficiency anemia, unspecified: Secondary | ICD-10-CM | POA: Diagnosis not present

## 2018-12-25 DIAGNOSIS — Z992 Dependence on renal dialysis: Secondary | ICD-10-CM | POA: Diagnosis not present

## 2018-12-25 DIAGNOSIS — N2581 Secondary hyperparathyroidism of renal origin: Secondary | ICD-10-CM | POA: Diagnosis not present

## 2018-12-25 DIAGNOSIS — Z23 Encounter for immunization: Secondary | ICD-10-CM | POA: Diagnosis not present

## 2018-12-25 DIAGNOSIS — D631 Anemia in chronic kidney disease: Secondary | ICD-10-CM | POA: Diagnosis not present

## 2019-01-03 DIAGNOSIS — R338 Other retention of urine: Secondary | ICD-10-CM | POA: Diagnosis not present

## 2019-01-25 ENCOUNTER — Encounter: Payer: Self-pay | Admitting: Family Medicine

## 2019-01-28 DIAGNOSIS — Z992 Dependence on renal dialysis: Secondary | ICD-10-CM | POA: Diagnosis not present

## 2019-01-28 DIAGNOSIS — N186 End stage renal disease: Secondary | ICD-10-CM | POA: Diagnosis not present

## 2019-01-28 DIAGNOSIS — N138 Other obstructive and reflux uropathy: Secondary | ICD-10-CM | POA: Diagnosis not present

## 2019-01-30 DIAGNOSIS — Z992 Dependence on renal dialysis: Secondary | ICD-10-CM | POA: Diagnosis not present

## 2019-01-30 DIAGNOSIS — D509 Iron deficiency anemia, unspecified: Secondary | ICD-10-CM | POA: Diagnosis not present

## 2019-01-30 DIAGNOSIS — D689 Coagulation defect, unspecified: Secondary | ICD-10-CM | POA: Diagnosis not present

## 2019-01-30 DIAGNOSIS — N2581 Secondary hyperparathyroidism of renal origin: Secondary | ICD-10-CM | POA: Diagnosis not present

## 2019-01-30 DIAGNOSIS — D631 Anemia in chronic kidney disease: Secondary | ICD-10-CM | POA: Diagnosis not present

## 2019-01-30 DIAGNOSIS — N186 End stage renal disease: Secondary | ICD-10-CM | POA: Diagnosis not present

## 2019-02-01 DIAGNOSIS — N2581 Secondary hyperparathyroidism of renal origin: Secondary | ICD-10-CM | POA: Diagnosis not present

## 2019-02-01 DIAGNOSIS — Z992 Dependence on renal dialysis: Secondary | ICD-10-CM | POA: Diagnosis not present

## 2019-02-01 DIAGNOSIS — N186 End stage renal disease: Secondary | ICD-10-CM | POA: Diagnosis not present

## 2019-02-01 DIAGNOSIS — D631 Anemia in chronic kidney disease: Secondary | ICD-10-CM | POA: Diagnosis not present

## 2019-02-01 DIAGNOSIS — D689 Coagulation defect, unspecified: Secondary | ICD-10-CM | POA: Diagnosis not present

## 2019-02-01 DIAGNOSIS — D509 Iron deficiency anemia, unspecified: Secondary | ICD-10-CM | POA: Diagnosis not present

## 2019-02-03 ENCOUNTER — Telehealth: Payer: Self-pay | Admitting: Family Medicine

## 2019-02-03 DIAGNOSIS — D631 Anemia in chronic kidney disease: Secondary | ICD-10-CM | POA: Diagnosis not present

## 2019-02-03 DIAGNOSIS — D509 Iron deficiency anemia, unspecified: Secondary | ICD-10-CM | POA: Diagnosis not present

## 2019-02-03 DIAGNOSIS — N2581 Secondary hyperparathyroidism of renal origin: Secondary | ICD-10-CM | POA: Diagnosis not present

## 2019-02-03 DIAGNOSIS — Z992 Dependence on renal dialysis: Secondary | ICD-10-CM | POA: Diagnosis not present

## 2019-02-03 DIAGNOSIS — D689 Coagulation defect, unspecified: Secondary | ICD-10-CM | POA: Diagnosis not present

## 2019-02-03 DIAGNOSIS — N186 End stage renal disease: Secondary | ICD-10-CM | POA: Diagnosis not present

## 2019-02-03 NOTE — Telephone Encounter (Signed)
Orders have been received from fax machine they will be placed in providers box (providers lounge)

## 2019-02-03 NOTE — Telephone Encounter (Signed)
[facility] is faxing over orders that need to be urgently signed and faxed over  Fax:[phone]

## 2019-02-04 NOTE — Telephone Encounter (Signed)
Signed paperwork last night.

## 2019-02-04 NOTE — Telephone Encounter (Signed)
Per Marianna Fuss she have placed this fax in your box to be signed off. If you do not have this in your box please let me know.

## 2019-02-05 DIAGNOSIS — N186 End stage renal disease: Secondary | ICD-10-CM | POA: Diagnosis not present

## 2019-02-05 DIAGNOSIS — N2581 Secondary hyperparathyroidism of renal origin: Secondary | ICD-10-CM | POA: Diagnosis not present

## 2019-02-05 DIAGNOSIS — D689 Coagulation defect, unspecified: Secondary | ICD-10-CM | POA: Diagnosis not present

## 2019-02-05 DIAGNOSIS — D509 Iron deficiency anemia, unspecified: Secondary | ICD-10-CM | POA: Diagnosis not present

## 2019-02-05 DIAGNOSIS — D631 Anemia in chronic kidney disease: Secondary | ICD-10-CM | POA: Diagnosis not present

## 2019-02-05 DIAGNOSIS — Z992 Dependence on renal dialysis: Secondary | ICD-10-CM | POA: Diagnosis not present

## 2019-02-08 DIAGNOSIS — D509 Iron deficiency anemia, unspecified: Secondary | ICD-10-CM | POA: Diagnosis not present

## 2019-02-08 DIAGNOSIS — N2581 Secondary hyperparathyroidism of renal origin: Secondary | ICD-10-CM | POA: Diagnosis not present

## 2019-02-08 DIAGNOSIS — N186 End stage renal disease: Secondary | ICD-10-CM | POA: Diagnosis not present

## 2019-02-08 DIAGNOSIS — Z992 Dependence on renal dialysis: Secondary | ICD-10-CM | POA: Diagnosis not present

## 2019-02-08 DIAGNOSIS — D631 Anemia in chronic kidney disease: Secondary | ICD-10-CM | POA: Diagnosis not present

## 2019-02-08 DIAGNOSIS — D689 Coagulation defect, unspecified: Secondary | ICD-10-CM | POA: Diagnosis not present

## 2019-02-09 ENCOUNTER — Encounter (HOSPITAL_COMMUNITY): Payer: Medicare Other

## 2019-02-09 ENCOUNTER — Encounter: Payer: Medicare Other | Admitting: Vascular Surgery

## 2019-02-10 ENCOUNTER — Encounter (HOSPITAL_COMMUNITY): Payer: Medicare Other

## 2019-02-10 ENCOUNTER — Encounter: Payer: Medicare Other | Admitting: Vascular Surgery

## 2019-02-10 DIAGNOSIS — D689 Coagulation defect, unspecified: Secondary | ICD-10-CM | POA: Diagnosis not present

## 2019-02-10 DIAGNOSIS — N2581 Secondary hyperparathyroidism of renal origin: Secondary | ICD-10-CM | POA: Diagnosis not present

## 2019-02-10 DIAGNOSIS — N186 End stage renal disease: Secondary | ICD-10-CM | POA: Diagnosis not present

## 2019-02-10 DIAGNOSIS — D631 Anemia in chronic kidney disease: Secondary | ICD-10-CM | POA: Diagnosis not present

## 2019-02-10 DIAGNOSIS — Z992 Dependence on renal dialysis: Secondary | ICD-10-CM | POA: Diagnosis not present

## 2019-02-10 DIAGNOSIS — D509 Iron deficiency anemia, unspecified: Secondary | ICD-10-CM | POA: Diagnosis not present

## 2019-02-12 DIAGNOSIS — Z992 Dependence on renal dialysis: Secondary | ICD-10-CM | POA: Diagnosis not present

## 2019-02-12 DIAGNOSIS — D631 Anemia in chronic kidney disease: Secondary | ICD-10-CM | POA: Diagnosis not present

## 2019-02-12 DIAGNOSIS — N2581 Secondary hyperparathyroidism of renal origin: Secondary | ICD-10-CM | POA: Diagnosis not present

## 2019-02-12 DIAGNOSIS — D689 Coagulation defect, unspecified: Secondary | ICD-10-CM | POA: Diagnosis not present

## 2019-02-12 DIAGNOSIS — N186 End stage renal disease: Secondary | ICD-10-CM | POA: Diagnosis not present

## 2019-02-12 DIAGNOSIS — D509 Iron deficiency anemia, unspecified: Secondary | ICD-10-CM | POA: Diagnosis not present

## 2019-02-15 DIAGNOSIS — N186 End stage renal disease: Secondary | ICD-10-CM | POA: Diagnosis not present

## 2019-02-15 DIAGNOSIS — Z992 Dependence on renal dialysis: Secondary | ICD-10-CM | POA: Diagnosis not present

## 2019-02-15 DIAGNOSIS — D689 Coagulation defect, unspecified: Secondary | ICD-10-CM | POA: Diagnosis not present

## 2019-02-15 DIAGNOSIS — N2581 Secondary hyperparathyroidism of renal origin: Secondary | ICD-10-CM | POA: Diagnosis not present

## 2019-02-15 DIAGNOSIS — D631 Anemia in chronic kidney disease: Secondary | ICD-10-CM | POA: Diagnosis not present

## 2019-02-15 DIAGNOSIS — D509 Iron deficiency anemia, unspecified: Secondary | ICD-10-CM | POA: Diagnosis not present

## 2019-02-16 ENCOUNTER — Encounter: Payer: Medicare Other | Admitting: Vascular Surgery

## 2019-02-16 ENCOUNTER — Telehealth: Payer: Self-pay | Admitting: Family Medicine

## 2019-02-16 ENCOUNTER — Inpatient Hospital Stay (HOSPITAL_COMMUNITY): Admission: RE | Admit: 2019-02-16 | Payer: Medicare Other | Source: Ambulatory Visit

## 2019-02-16 NOTE — Telephone Encounter (Signed)
I have attempted to call Otila Kluver back with no answer. They will give [place] a call back.

## 2019-02-16 NOTE — Telephone Encounter (Signed)
[facility] CALLED IN REGARD TO PATIENT. SHE SAID HER OFFICE NEVER RECEIVED THE SIGNED PAPERWORK FROM DR. GREENE. TELEPHONE ENCOUNTER STATES HE SIGNED ON 01/07 BUT NO FAX CONFIRMATION.   TINA REQUESTING A CB AT [phone] TO CONFIRM PAPERWORK IS IN OFFICE.  SHE STATES SHE HAS BEEN FAXING THE OFFICE PAPERWORK SINCE November WITH NO COMMUNICATION BACK

## 2019-02-17 DIAGNOSIS — Z992 Dependence on renal dialysis: Secondary | ICD-10-CM | POA: Diagnosis not present

## 2019-02-17 DIAGNOSIS — N186 End stage renal disease: Secondary | ICD-10-CM | POA: Diagnosis not present

## 2019-02-17 DIAGNOSIS — D509 Iron deficiency anemia, unspecified: Secondary | ICD-10-CM | POA: Diagnosis not present

## 2019-02-17 DIAGNOSIS — N2581 Secondary hyperparathyroidism of renal origin: Secondary | ICD-10-CM | POA: Diagnosis not present

## 2019-02-17 DIAGNOSIS — D689 Coagulation defect, unspecified: Secondary | ICD-10-CM | POA: Diagnosis not present

## 2019-02-17 DIAGNOSIS — D631 Anemia in chronic kidney disease: Secondary | ICD-10-CM | POA: Diagnosis not present

## 2019-02-19 DIAGNOSIS — D509 Iron deficiency anemia, unspecified: Secondary | ICD-10-CM | POA: Diagnosis not present

## 2019-02-19 DIAGNOSIS — N186 End stage renal disease: Secondary | ICD-10-CM | POA: Diagnosis not present

## 2019-02-19 DIAGNOSIS — D689 Coagulation defect, unspecified: Secondary | ICD-10-CM | POA: Diagnosis not present

## 2019-02-19 DIAGNOSIS — Z992 Dependence on renal dialysis: Secondary | ICD-10-CM | POA: Diagnosis not present

## 2019-02-19 DIAGNOSIS — N2581 Secondary hyperparathyroidism of renal origin: Secondary | ICD-10-CM | POA: Diagnosis not present

## 2019-02-19 DIAGNOSIS — D631 Anemia in chronic kidney disease: Secondary | ICD-10-CM | POA: Diagnosis not present

## 2019-02-22 DIAGNOSIS — D689 Coagulation defect, unspecified: Secondary | ICD-10-CM | POA: Diagnosis not present

## 2019-02-22 DIAGNOSIS — N186 End stage renal disease: Secondary | ICD-10-CM | POA: Diagnosis not present

## 2019-02-22 DIAGNOSIS — D631 Anemia in chronic kidney disease: Secondary | ICD-10-CM | POA: Diagnosis not present

## 2019-02-22 DIAGNOSIS — N2581 Secondary hyperparathyroidism of renal origin: Secondary | ICD-10-CM | POA: Diagnosis not present

## 2019-02-22 DIAGNOSIS — Z992 Dependence on renal dialysis: Secondary | ICD-10-CM | POA: Diagnosis not present

## 2019-02-22 DIAGNOSIS — D509 Iron deficiency anemia, unspecified: Secondary | ICD-10-CM | POA: Diagnosis not present

## 2019-02-24 DIAGNOSIS — N2581 Secondary hyperparathyroidism of renal origin: Secondary | ICD-10-CM | POA: Diagnosis not present

## 2019-02-24 DIAGNOSIS — D689 Coagulation defect, unspecified: Secondary | ICD-10-CM | POA: Diagnosis not present

## 2019-02-24 DIAGNOSIS — D509 Iron deficiency anemia, unspecified: Secondary | ICD-10-CM | POA: Diagnosis not present

## 2019-02-24 DIAGNOSIS — D631 Anemia in chronic kidney disease: Secondary | ICD-10-CM | POA: Diagnosis not present

## 2019-02-24 DIAGNOSIS — Z992 Dependence on renal dialysis: Secondary | ICD-10-CM | POA: Diagnosis not present

## 2019-02-24 DIAGNOSIS — N186 End stage renal disease: Secondary | ICD-10-CM | POA: Diagnosis not present

## 2019-02-26 DIAGNOSIS — N2581 Secondary hyperparathyroidism of renal origin: Secondary | ICD-10-CM | POA: Diagnosis not present

## 2019-02-26 DIAGNOSIS — Z992 Dependence on renal dialysis: Secondary | ICD-10-CM | POA: Diagnosis not present

## 2019-02-26 DIAGNOSIS — D509 Iron deficiency anemia, unspecified: Secondary | ICD-10-CM | POA: Diagnosis not present

## 2019-02-26 DIAGNOSIS — D689 Coagulation defect, unspecified: Secondary | ICD-10-CM | POA: Diagnosis not present

## 2019-02-26 DIAGNOSIS — N186 End stage renal disease: Secondary | ICD-10-CM | POA: Diagnosis not present

## 2019-02-26 DIAGNOSIS — D631 Anemia in chronic kidney disease: Secondary | ICD-10-CM | POA: Diagnosis not present

## 2019-02-28 DIAGNOSIS — N138 Other obstructive and reflux uropathy: Secondary | ICD-10-CM | POA: Diagnosis not present

## 2019-02-28 DIAGNOSIS — N186 End stage renal disease: Secondary | ICD-10-CM | POA: Diagnosis not present

## 2019-02-28 DIAGNOSIS — Z992 Dependence on renal dialysis: Secondary | ICD-10-CM | POA: Diagnosis not present

## 2019-03-01 DIAGNOSIS — N186 End stage renal disease: Secondary | ICD-10-CM | POA: Diagnosis not present

## 2019-03-01 DIAGNOSIS — Z992 Dependence on renal dialysis: Secondary | ICD-10-CM | POA: Diagnosis not present

## 2019-03-01 DIAGNOSIS — N2581 Secondary hyperparathyroidism of renal origin: Secondary | ICD-10-CM | POA: Diagnosis not present

## 2019-03-01 DIAGNOSIS — D631 Anemia in chronic kidney disease: Secondary | ICD-10-CM | POA: Diagnosis not present

## 2019-03-01 DIAGNOSIS — D689 Coagulation defect, unspecified: Secondary | ICD-10-CM | POA: Diagnosis not present

## 2019-03-01 DIAGNOSIS — D509 Iron deficiency anemia, unspecified: Secondary | ICD-10-CM | POA: Diagnosis not present

## 2019-03-03 DIAGNOSIS — D689 Coagulation defect, unspecified: Secondary | ICD-10-CM | POA: Diagnosis not present

## 2019-03-03 DIAGNOSIS — Z992 Dependence on renal dialysis: Secondary | ICD-10-CM | POA: Diagnosis not present

## 2019-03-03 DIAGNOSIS — D509 Iron deficiency anemia, unspecified: Secondary | ICD-10-CM | POA: Diagnosis not present

## 2019-03-03 DIAGNOSIS — N2581 Secondary hyperparathyroidism of renal origin: Secondary | ICD-10-CM | POA: Diagnosis not present

## 2019-03-03 DIAGNOSIS — D631 Anemia in chronic kidney disease: Secondary | ICD-10-CM | POA: Diagnosis not present

## 2019-03-03 DIAGNOSIS — N186 End stage renal disease: Secondary | ICD-10-CM | POA: Diagnosis not present

## 2019-03-05 DIAGNOSIS — Z992 Dependence on renal dialysis: Secondary | ICD-10-CM | POA: Diagnosis not present

## 2019-03-05 DIAGNOSIS — D509 Iron deficiency anemia, unspecified: Secondary | ICD-10-CM | POA: Diagnosis not present

## 2019-03-05 DIAGNOSIS — D631 Anemia in chronic kidney disease: Secondary | ICD-10-CM | POA: Diagnosis not present

## 2019-03-05 DIAGNOSIS — N2581 Secondary hyperparathyroidism of renal origin: Secondary | ICD-10-CM | POA: Diagnosis not present

## 2019-03-05 DIAGNOSIS — N186 End stage renal disease: Secondary | ICD-10-CM | POA: Diagnosis not present

## 2019-03-05 DIAGNOSIS — D689 Coagulation defect, unspecified: Secondary | ICD-10-CM | POA: Diagnosis not present

## 2019-03-08 DIAGNOSIS — D509 Iron deficiency anemia, unspecified: Secondary | ICD-10-CM | POA: Diagnosis not present

## 2019-03-08 DIAGNOSIS — Z992 Dependence on renal dialysis: Secondary | ICD-10-CM | POA: Diagnosis not present

## 2019-03-08 DIAGNOSIS — N2581 Secondary hyperparathyroidism of renal origin: Secondary | ICD-10-CM | POA: Diagnosis not present

## 2019-03-08 DIAGNOSIS — N186 End stage renal disease: Secondary | ICD-10-CM | POA: Diagnosis not present

## 2019-03-08 DIAGNOSIS — D689 Coagulation defect, unspecified: Secondary | ICD-10-CM | POA: Diagnosis not present

## 2019-03-08 DIAGNOSIS — D631 Anemia in chronic kidney disease: Secondary | ICD-10-CM | POA: Diagnosis not present

## 2019-03-10 DIAGNOSIS — D689 Coagulation defect, unspecified: Secondary | ICD-10-CM | POA: Diagnosis not present

## 2019-03-10 DIAGNOSIS — D631 Anemia in chronic kidney disease: Secondary | ICD-10-CM | POA: Diagnosis not present

## 2019-03-10 DIAGNOSIS — N186 End stage renal disease: Secondary | ICD-10-CM | POA: Diagnosis not present

## 2019-03-10 DIAGNOSIS — D509 Iron deficiency anemia, unspecified: Secondary | ICD-10-CM | POA: Diagnosis not present

## 2019-03-10 DIAGNOSIS — Z992 Dependence on renal dialysis: Secondary | ICD-10-CM | POA: Diagnosis not present

## 2019-03-10 DIAGNOSIS — N2581 Secondary hyperparathyroidism of renal origin: Secondary | ICD-10-CM | POA: Diagnosis not present

## 2019-03-12 DIAGNOSIS — N2581 Secondary hyperparathyroidism of renal origin: Secondary | ICD-10-CM | POA: Diagnosis not present

## 2019-03-12 DIAGNOSIS — D689 Coagulation defect, unspecified: Secondary | ICD-10-CM | POA: Diagnosis not present

## 2019-03-12 DIAGNOSIS — Z992 Dependence on renal dialysis: Secondary | ICD-10-CM | POA: Diagnosis not present

## 2019-03-12 DIAGNOSIS — D631 Anemia in chronic kidney disease: Secondary | ICD-10-CM | POA: Diagnosis not present

## 2019-03-12 DIAGNOSIS — D509 Iron deficiency anemia, unspecified: Secondary | ICD-10-CM | POA: Diagnosis not present

## 2019-03-12 DIAGNOSIS — N186 End stage renal disease: Secondary | ICD-10-CM | POA: Diagnosis not present

## 2019-03-15 DIAGNOSIS — D631 Anemia in chronic kidney disease: Secondary | ICD-10-CM | POA: Diagnosis not present

## 2019-03-15 DIAGNOSIS — N2581 Secondary hyperparathyroidism of renal origin: Secondary | ICD-10-CM | POA: Diagnosis not present

## 2019-03-15 DIAGNOSIS — D509 Iron deficiency anemia, unspecified: Secondary | ICD-10-CM | POA: Diagnosis not present

## 2019-03-15 DIAGNOSIS — N186 End stage renal disease: Secondary | ICD-10-CM | POA: Diagnosis not present

## 2019-03-15 DIAGNOSIS — D689 Coagulation defect, unspecified: Secondary | ICD-10-CM | POA: Diagnosis not present

## 2019-03-15 DIAGNOSIS — Z992 Dependence on renal dialysis: Secondary | ICD-10-CM | POA: Diagnosis not present

## 2019-03-16 ENCOUNTER — Encounter: Payer: Self-pay | Admitting: Vascular Surgery

## 2019-03-16 ENCOUNTER — Other Ambulatory Visit: Payer: Self-pay

## 2019-03-16 ENCOUNTER — Ambulatory Visit (HOSPITAL_COMMUNITY)
Admission: RE | Admit: 2019-03-16 | Discharge: 2019-03-16 | Disposition: A | Discharge status: Home / Self Care | Payer: Medicare Other | Source: Ambulatory Visit | Attending: Vascular Surgery | Admitting: Vascular Surgery

## 2019-03-16 ENCOUNTER — Ambulatory Visit (INDEPENDENT_AMBULATORY_CARE_PROVIDER_SITE_OTHER): Payer: Medicare Other | Admitting: Vascular Surgery

## 2019-03-16 VITALS — BP 131/77 | HR 82 | Temp 98.1°F | Resp 20 | Ht 70.5 in | Wt 171.0 lb

## 2019-03-16 DIAGNOSIS — N186 End stage renal disease: Secondary | ICD-10-CM | POA: Diagnosis not present

## 2019-03-16 DIAGNOSIS — N179 Acute kidney failure, unspecified: Secondary | ICD-10-CM | POA: Diagnosis not present

## 2019-03-16 DIAGNOSIS — Z992 Dependence on renal dialysis: Secondary | ICD-10-CM

## 2019-03-16 NOTE — Progress Notes (Signed)
Patient name: Matthew [name] MRN: [id number] DOB: 08-06-1950 Sex: male  REASON FOR VISIT:   Follow-up of first stage basilic vein transposition.  HPI:   Matthew [name] is a pleasant 69 y.o. male who underwent a left first stage basilic vein transposition in September of last year.  He dialyzes on Tuesdays Thursdays and Saturdays.  He has been dialyzing with a right IJ tunneled dialysis catheter.  He tells me that recently this has not been working well.  They have given some thrombolytic agents without much benefit.  He returns now to be considered for second stage basilic vein transposition.  Of not sure what the delay was since September.  He denies any recent uremic symptoms.  Specifically, he denies nausea, vomiting, fatigue, anorexia, or palpitations.  Current Outpatient Medications  Medication Sig Dispense Refill  . aspirin 325 MG tablet Take 325 mg by mouth as needed for mild pain (thin blood).    . calcitRIOL (ROCALTROL) 0.5 MCG capsule Take 1 capsule (0.5 mcg total) by mouth daily. 30 capsule 0  . folic acid (FOLVITE) 1 MG tablet Take 1 tablet (1 mg total) by mouth daily. 30 tablet 0  . Methoxy PEG-Epoetin Beta (MIRCERA IJ) Mircera    . Multiple Vitamin (MULTIVITAMIN WITH MINERALS) TABS tablet Take 1 tablet by mouth daily. 30 tablet 0  . thiamine 100 MG tablet Take 1 tablet (100 mg total) by mouth daily. 30 tablet 0  . VELPHORO 500 MG chewable tablet Take 2 tablet by mouth three times a day with meals and 1 BID prn with snacks     No current facility-administered medications for this visit.    REVIEW OF SYSTEMS:  [X]  denotes positive finding, [ ]  denotes negative finding Vascular    Leg swelling    Cardiac    Chest pain or chest pressure:    Shortness of breath upon exertion:    Short of breath when lying flat:    Irregular heart rhythm:    Constitutional    Fever or chills:     PHYSICAL EXAM:   Vitals:   03/16/19 1531  BP: 131/77  Pulse: 82  Resp: 20  Temp:  98.1 F (36.7 C)  SpO2: 99%  Weight: 171 lb (77.6 kg)  Height: 5' 10.5" (1.791 m)    GENERAL: The patient is a well-nourished male, in no acute distress. The vital signs are documented above. CARDIOVASCULAR: There is a regular rate and rhythm. PULMONARY: There is good air exchange bilaterally without wheezing or rales. VASCULAR: His left basilic vein transposition has an excellent thrill.  It is not especially pulsatile which would suggest the narrowing proximally may not be significant.  He has a normal left radial pulse.  DATA:   DIALYSIS ACCESS DUPLEX: I have independently interpreted the patient's duplex scan of the left for stage basilic vein transposition.  This shows the diameters in the distal upper extremity narrow down to 0.18 cm.  Above that the vein is dilated to 0.73 and 0.82 cm.  MEDICAL ISSUES:   END-STAGE RENAL DISEASE: Based on his duplex which shows that the fistula does appear to maturing nicely except for 1 small area of narrowing proximally I think we can proceed with a second stage basilic vein transposition on the left.  In addition, we will exchange his catheter if they are still having problems with this working.  I will assess the one area that slightly narrow approximately 3 cm distal to the arterial anastomosis and this could potentially be  patched or ballooned if there is significant stenosis here.  We could also follow this after the second stage basilic vein transposition with a fistulogram if the stenosis here progresses.  I have discussed the indications for the procedure and the potential complications.  The surgery has been scheduled for 04/04/2019.  Deitra Mayo [facility] [place] [phone]

## 2019-03-16 NOTE — H&P (View-Only) (Signed)
Patient name: Matthew [name] MRN: [id number] DOB: 1950-06-03 Sex: male  REASON FOR VISIT:   Follow-up of first stage basilic vein transposition.  HPI:   Matthew [name] is a pleasant 69 y.o. male who underwent a left first stage basilic vein transposition in September of last year.  He dialyzes on Tuesdays Thursdays and Saturdays.  He has been dialyzing with a right IJ tunneled dialysis catheter.  He tells me that recently this has not been working well.  They have given some thrombolytic agents without much benefit.  He returns now to be considered for second stage basilic vein transposition.  Of not sure what the delay was since September.  He denies any recent uremic symptoms.  Specifically, he denies nausea, vomiting, fatigue, anorexia, or palpitations.  Current Outpatient Medications  Medication Sig Dispense Refill  . aspirin 325 MG tablet Take 325 mg by mouth as needed for mild pain (thin blood).    . calcitRIOL (ROCALTROL) 0.5 MCG capsule Take 1 capsule (0.5 mcg total) by mouth daily. 30 capsule 0  . folic acid (FOLVITE) 1 MG tablet Take 1 tablet (1 mg total) by mouth daily. 30 tablet 0  . Methoxy PEG-Epoetin Beta (MIRCERA IJ) Mircera    . Multiple Vitamin (MULTIVITAMIN WITH MINERALS) TABS tablet Take 1 tablet by mouth daily. 30 tablet 0  . thiamine 100 MG tablet Take 1 tablet (100 mg total) by mouth daily. 30 tablet 0  . VELPHORO 500 MG chewable tablet Take 2 tablet by mouth three times a day with meals and 1 BID prn with snacks     No current facility-administered medications for this visit.    REVIEW OF SYSTEMS:  [X]  denotes positive finding, [ ]  denotes negative finding Vascular    Leg swelling    Cardiac    Chest pain or chest pressure:    Shortness of breath upon exertion:    Short of breath when lying flat:    Irregular heart rhythm:    Constitutional    Fever or chills:     PHYSICAL EXAM:   Vitals:   03/16/19 1531  BP: 131/77  Pulse: 82  Resp: 20  Temp:  98.1 F (36.7 C)  SpO2: 99%  Weight: 171 lb (77.6 kg)  Height: 5' 10.5" (1.791 m)    GENERAL: The patient is a well-nourished male, in no acute distress. The vital signs are documented above. CARDIOVASCULAR: There is a regular rate and rhythm. PULMONARY: There is good air exchange bilaterally without wheezing or rales. VASCULAR: His left basilic vein transposition has an excellent thrill.  It is not especially pulsatile which would suggest the narrowing proximally may not be significant.  He has a normal left radial pulse.  DATA:   DIALYSIS ACCESS DUPLEX: I have independently interpreted the patient's duplex scan of the left for stage basilic vein transposition.  This shows the diameters in the distal upper extremity narrow down to 0.18 cm.  Above that the vein is dilated to 0.73 and 0.82 cm.  MEDICAL ISSUES:   END-STAGE RENAL DISEASE: Based on his duplex which shows that the fistula does appear to maturing nicely except for 1 small area of narrowing proximally I think we can proceed with a second stage basilic vein transposition on the left.  In addition, we will exchange his catheter if they are still having problems with this working.  I will assess the one area that slightly narrow approximately 3 cm distal to the arterial anastomosis and this could potentially be  patched or ballooned if there is significant stenosis here.  We could also follow this after the second stage basilic vein transposition with a fistulogram if the stenosis here progresses.  I have discussed the indications for the procedure and the potential complications.  The surgery has been scheduled for 04/04/2019.  Deitra Mayo [facility] [phone]

## 2019-03-17 DIAGNOSIS — N186 End stage renal disease: Secondary | ICD-10-CM | POA: Diagnosis not present

## 2019-03-17 DIAGNOSIS — D509 Iron deficiency anemia, unspecified: Secondary | ICD-10-CM | POA: Diagnosis not present

## 2019-03-17 DIAGNOSIS — D689 Coagulation defect, unspecified: Secondary | ICD-10-CM | POA: Diagnosis not present

## 2019-03-17 DIAGNOSIS — D631 Anemia in chronic kidney disease: Secondary | ICD-10-CM | POA: Diagnosis not present

## 2019-03-17 DIAGNOSIS — N2581 Secondary hyperparathyroidism of renal origin: Secondary | ICD-10-CM | POA: Diagnosis not present

## 2019-03-17 DIAGNOSIS — Z992 Dependence on renal dialysis: Secondary | ICD-10-CM | POA: Diagnosis not present

## 2019-03-18 DIAGNOSIS — T8249XA Other complication of vascular dialysis catheter, initial encounter: Secondary | ICD-10-CM | POA: Diagnosis not present

## 2019-03-18 DIAGNOSIS — Z992 Dependence on renal dialysis: Secondary | ICD-10-CM | POA: Diagnosis not present

## 2019-03-18 DIAGNOSIS — N186 End stage renal disease: Secondary | ICD-10-CM | POA: Diagnosis not present

## 2019-03-19 DIAGNOSIS — N186 End stage renal disease: Secondary | ICD-10-CM | POA: Diagnosis not present

## 2019-03-19 DIAGNOSIS — Z992 Dependence on renal dialysis: Secondary | ICD-10-CM | POA: Diagnosis not present

## 2019-03-19 DIAGNOSIS — N2581 Secondary hyperparathyroidism of renal origin: Secondary | ICD-10-CM | POA: Diagnosis not present

## 2019-03-19 DIAGNOSIS — D631 Anemia in chronic kidney disease: Secondary | ICD-10-CM | POA: Diagnosis not present

## 2019-03-19 DIAGNOSIS — D509 Iron deficiency anemia, unspecified: Secondary | ICD-10-CM | POA: Diagnosis not present

## 2019-03-19 DIAGNOSIS — D689 Coagulation defect, unspecified: Secondary | ICD-10-CM | POA: Diagnosis not present

## 2019-03-22 DIAGNOSIS — Z992 Dependence on renal dialysis: Secondary | ICD-10-CM | POA: Diagnosis not present

## 2019-03-22 DIAGNOSIS — D509 Iron deficiency anemia, unspecified: Secondary | ICD-10-CM | POA: Diagnosis not present

## 2019-03-22 DIAGNOSIS — D631 Anemia in chronic kidney disease: Secondary | ICD-10-CM | POA: Diagnosis not present

## 2019-03-22 DIAGNOSIS — N186 End stage renal disease: Secondary | ICD-10-CM | POA: Diagnosis not present

## 2019-03-22 DIAGNOSIS — N2581 Secondary hyperparathyroidism of renal origin: Secondary | ICD-10-CM | POA: Diagnosis not present

## 2019-03-22 DIAGNOSIS — D689 Coagulation defect, unspecified: Secondary | ICD-10-CM | POA: Diagnosis not present

## 2019-03-24 DIAGNOSIS — D631 Anemia in chronic kidney disease: Secondary | ICD-10-CM | POA: Diagnosis not present

## 2019-03-24 DIAGNOSIS — Z992 Dependence on renal dialysis: Secondary | ICD-10-CM | POA: Diagnosis not present

## 2019-03-24 DIAGNOSIS — D689 Coagulation defect, unspecified: Secondary | ICD-10-CM | POA: Diagnosis not present

## 2019-03-24 DIAGNOSIS — D509 Iron deficiency anemia, unspecified: Secondary | ICD-10-CM | POA: Diagnosis not present

## 2019-03-24 DIAGNOSIS — N2581 Secondary hyperparathyroidism of renal origin: Secondary | ICD-10-CM | POA: Diagnosis not present

## 2019-03-24 DIAGNOSIS — N186 End stage renal disease: Secondary | ICD-10-CM | POA: Diagnosis not present

## 2019-03-25 DIAGNOSIS — N2581 Secondary hyperparathyroidism of renal origin: Secondary | ICD-10-CM | POA: Diagnosis not present

## 2019-03-25 DIAGNOSIS — Z992 Dependence on renal dialysis: Secondary | ICD-10-CM | POA: Diagnosis not present

## 2019-03-25 DIAGNOSIS — N186 End stage renal disease: Secondary | ICD-10-CM | POA: Diagnosis not present

## 2019-03-25 DIAGNOSIS — E8779 Other fluid overload: Secondary | ICD-10-CM | POA: Diagnosis not present

## 2019-03-26 DIAGNOSIS — Z992 Dependence on renal dialysis: Secondary | ICD-10-CM | POA: Diagnosis not present

## 2019-03-26 DIAGNOSIS — D631 Anemia in chronic kidney disease: Secondary | ICD-10-CM | POA: Diagnosis not present

## 2019-03-26 DIAGNOSIS — D689 Coagulation defect, unspecified: Secondary | ICD-10-CM | POA: Diagnosis not present

## 2019-03-26 DIAGNOSIS — N186 End stage renal disease: Secondary | ICD-10-CM | POA: Diagnosis not present

## 2019-03-26 DIAGNOSIS — N2581 Secondary hyperparathyroidism of renal origin: Secondary | ICD-10-CM | POA: Diagnosis not present

## 2019-03-26 DIAGNOSIS — D509 Iron deficiency anemia, unspecified: Secondary | ICD-10-CM | POA: Diagnosis not present

## 2019-03-28 DIAGNOSIS — N138 Other obstructive and reflux uropathy: Secondary | ICD-10-CM | POA: Diagnosis not present

## 2019-03-28 DIAGNOSIS — Z992 Dependence on renal dialysis: Secondary | ICD-10-CM | POA: Diagnosis not present

## 2019-03-28 DIAGNOSIS — N186 End stage renal disease: Secondary | ICD-10-CM | POA: Diagnosis not present

## 2019-03-29 DIAGNOSIS — Z992 Dependence on renal dialysis: Secondary | ICD-10-CM | POA: Diagnosis not present

## 2019-03-29 DIAGNOSIS — D509 Iron deficiency anemia, unspecified: Secondary | ICD-10-CM | POA: Diagnosis not present

## 2019-03-29 DIAGNOSIS — D631 Anemia in chronic kidney disease: Secondary | ICD-10-CM | POA: Diagnosis not present

## 2019-03-29 DIAGNOSIS — N2581 Secondary hyperparathyroidism of renal origin: Secondary | ICD-10-CM | POA: Diagnosis not present

## 2019-03-29 DIAGNOSIS — N186 End stage renal disease: Secondary | ICD-10-CM | POA: Diagnosis not present

## 2019-03-29 DIAGNOSIS — Z23 Encounter for immunization: Secondary | ICD-10-CM | POA: Diagnosis not present

## 2019-03-29 DIAGNOSIS — D689 Coagulation defect, unspecified: Secondary | ICD-10-CM | POA: Diagnosis not present

## 2019-03-31 DIAGNOSIS — Z992 Dependence on renal dialysis: Secondary | ICD-10-CM | POA: Diagnosis not present

## 2019-03-31 DIAGNOSIS — D631 Anemia in chronic kidney disease: Secondary | ICD-10-CM | POA: Diagnosis not present

## 2019-03-31 DIAGNOSIS — D509 Iron deficiency anemia, unspecified: Secondary | ICD-10-CM | POA: Diagnosis not present

## 2019-03-31 DIAGNOSIS — D689 Coagulation defect, unspecified: Secondary | ICD-10-CM | POA: Diagnosis not present

## 2019-03-31 DIAGNOSIS — N2581 Secondary hyperparathyroidism of renal origin: Secondary | ICD-10-CM | POA: Diagnosis not present

## 2019-03-31 DIAGNOSIS — N186 End stage renal disease: Secondary | ICD-10-CM | POA: Diagnosis not present

## 2019-04-01 ENCOUNTER — Other Ambulatory Visit: Payer: Self-pay

## 2019-04-01 ENCOUNTER — Encounter (HOSPITAL_COMMUNITY): Payer: Self-pay | Admitting: Vascular Surgery

## 2019-04-01 ENCOUNTER — Other Ambulatory Visit (HOSPITAL_COMMUNITY)
Admission: RE | Admit: 2019-04-01 | Discharge: 2019-04-01 | Disposition: A | Discharge status: Home / Self Care | Payer: Medicare Other | Source: Ambulatory Visit | Attending: Vascular Surgery | Admitting: Vascular Surgery

## 2019-04-01 DIAGNOSIS — Z20822 Contact with and (suspected) exposure to covid-19: Secondary | ICD-10-CM | POA: Insufficient documentation

## 2019-04-01 DIAGNOSIS — Z01812 Encounter for preprocedural laboratory examination: Secondary | ICD-10-CM | POA: Diagnosis not present

## 2019-04-01 LAB — SARS CORONAVIRUS 2 (TAT 6-24 HRS): SARS Coronavirus 2: NEGATIVE

## 2019-04-01 NOTE — Progress Notes (Signed)
Nurse returned call to pt daughter, [name],  to provide COVID testing info.

## 2019-04-01 NOTE — Progress Notes (Signed)
Pt stated that he is not currently being treated for diabetes and does not check his CBG.

## 2019-04-01 NOTE — Progress Notes (Addendum)
Pt denies SOB, chest pain, and being under the care of a cardiologist. Pt stated that PCP is Dr. Merri Ray. Pt made aware to stop taking vitamins, fish oil and herbal medications. Do not take any NSAIDs ie: Ibuprofen, Advil, Naproxen (Aleve), Motrin, BC and Goody Powder. Pt denies having a stress test, echo and cardiac cath. Pt reminded to quarantine after COVID testing. Pt verbalized understanding of all pre-op instructions.

## 2019-04-01 NOTE — Progress Notes (Signed)
Anesthesia Chart Review: Kathleene Hazel   Case: [id number] Date/Time: 04/04/19 0715   Procedures:      BASCILIC VEIN TRANSPOSITION (Left )     INSERTION OF DIALYSIS CATHETER EXCHANGE (N/A )   Anesthesia type: Monitor Anesthesia Care   Pre-op diagnosis: END STAGE RENAL DISEASE   Location: [facility] OR ROOM 16 / [facility]   Surgeons: Angelia Mould, MD      DISCUSSION: Patient is a 69 year old male scheduled for the above procedure.  He needs a new dialysis catheter as his current one is not working well.  He is s/p first stage basilic vein transposition and right IJ tunneled dialysis catheter insertion on 10/18/18.   History includes former smoker (quit 1999), BPH/urinary retention, ESRD (started hemodialysis 10/18/18), HTN (improved with hemodialysis), DM2 (in remission, A1c < 4.2% 10/13/18), anemia. - Admission 10/12/18-10/19/18 for progression of CKD stage V to ESRD in the setting of obstructive uropathy. Foley placed. Nephrology consulted. Also anemia with HGB 6.3 requiring PRBC. Ultimately was started on hemodialysis.  - Large LUL lung bulla on 03/2018 CT and 09/2018 CXR. - Mild-moderate AR by 05/06/18 echo. - He reported occasional alcohol use--documented as 10-12 beers/week.  He is a same-day work-up.  He denied shortness of breath and chest pain per PAT RN phone interview.  He tolerated similar procedure in September 2020.  04/01/2019 presurgical COVID-19 test is in process.  Anesthesia team to evaluate on the day of surgery.   VS:   BP Readings from Last 3 Encounters:  03/16/19 131/77  12/15/18 115/72  11/03/18 102/61   Pulse Readings from Last 3 Encounters:  03/16/19 82  12/15/18 85  11/03/18 84    PROVIDERS: Wendie Agreste, MD is PCP ([facility]) Jannifer Hick, MD is nephrologist Baruch Gouty, MD is urologist   LABS: He is for ISTAT on the day of surgery. As of 11/03/18, H/H 9.8/29.4. A1c < 4.2% on 10/13/18.   IMAGES: 1V CXR 10/18/18: FINDINGS: Right dialysis  catheter tip is in the SVC. No pneumothorax. Large bulla in the left upper lobe, stable. No confluent opacities or effusions. Mild cardiomegaly. IMPRESSION: - Interval placement of right dialysis catheter.  No pneumothorax. - Stable large left upper lobe bulla. - Mild cardiomegaly.  CT Chest 04/26/18: IMPRESSION: - Large solitary bulla is noted in left upper lobe measuring 15 x 10 cm. No pneumothorax or pulmonary consolidation is noted. - Incidental note is made of bilateral hydronephrosis seen in visualized portion of upper abdomen. Further evaluation with renal ultrasound or CT urogram is recommended. - Aortic Atherosclerosis (ICD10-I70.0). (F/U US Renal 05/04/18: Decreasing bilateral hydronephrosis, mild on today's study.)   EKG: 10/12/18: SR    CV: Echo 05/06/18 (done during admission for syncope, possible orthostasis): IMPRESSIONS  1. The left ventricle has normal systolic function with an ejection  fraction of 60-65%. The cavity size was normal. Left ventricular diastolic  parameters were normal.  2. The right ventricle has normal systolic function. The cavity was  normal. There is no increase in right ventricular wall thickness.  3. The aortic valve is tricuspid. Moderate thickening of the aortic  valve. Moderate calcification of the aortic valve. Aortic valve  regurgitation is mild to moderate by color flow Doppler.    Past Medical History:  Diagnosis Date  . Arthritis   . Diabetes ([place])    pt stated that he is no longer being treated for DM  . ESRD (end stage renal disease) ([place]) 04/26/2018  . Essential hypertension  Pt statewd that he now has low BP    Past Surgical History:  Procedure Laterality Date  . AV FISTULA PLACEMENT Left 10/18/2018   Procedure: First Stage Basilic Transposition.;  Surgeon: Angelia Mould, MD;  Location: [facility] OR;  Service: Vascular;  Laterality: Left;  . finger lanced    . INSERTION OF DIALYSIS CATHETER Right 10/18/2018    Procedure: INSERTION OF Right Internal Jugular DIALYSIS CATHETER;  Surgeon: Angelia Mould, MD;  Location: [facility] OR;  Service: Vascular;  Laterality: Right;    MEDICATIONS: No current facility-administered medications for this encounter.   Marland Kitchen RENVELA 800 MG tablet  . aspirin 325 MG tablet  . calcitRIOL (ROCALTROL) 0.5 MCG capsule  . folic acid (FOLVITE) 1 MG tablet  . Methoxy PEG-Epoetin Beta (MIRCERA IJ)  . Multiple Vitamin (MULTIVITAMIN WITH MINERALS) TABS tablet  . thiamine 100 MG tablet  . VELPHORO 500 MG chewable tablet     Myra Gianotti, PA-C Surgical Short Stay/Anesthesiology [facility] Phone [phone] [facility] Phone [phone] 04/01/2019 4:50 PM

## 2019-04-01 NOTE — Anesthesia Preprocedure Evaluation (Addendum)
Anesthesia Evaluation  Patient identified by MRN, date of birth, ID band Patient awake    Reviewed: Allergy & Precautions, NPO status , Patient's Chart, lab work & pertinent test results  History of Anesthesia Complications Negative for: history of anesthetic complications  Airway Mallampati: II  TM Distance: >3 FB Neck ROM: Full    Dental  (+) Dental Advisory Given   Pulmonary former smoker (quit 1999),  04/01/2019 SARS coronavirus NEG   breath sounds clear to auscultation       Cardiovascular hypertension, (-) angina Rhythm:Regular Rate:Normal  '20 ECHO: EF 60-65%, mod AV thickening with mild-mod AI   Neuro/Psych negative neurological ROS     GI/Hepatic negative GI ROS, (+)     substance abuse  alcohol use,   Endo/Other  diabetes  Renal/GU ESRFRenal disease (K+ 4.6)     Musculoskeletal  (+) Arthritis ,   Abdominal   Peds  Hematology negative hematology ROS (+)   Anesthesia Other Findings   Reproductive/Obstetrics                           Anesthesia Physical Anesthesia Plan  ASA: III  Anesthesia Plan: MAC   Post-op Pain Management:    Induction:   PONV Risk Score and Plan: 1 and Treatment may vary due to age or medical condition and Ondansetron  Airway Management Planned: Natural Airway and Simple Face Mask  Additional Equipment:   Intra-op Plan:   Post-operative Plan:   Informed Consent: I have reviewed the patients History and Physical, chart, labs and discussed the procedure including the risks, benefits and alternatives for the proposed anesthesia with the patient or authorized representative who has indicated his/her understanding and acceptance.     Dental advisory given  Plan Discussed with: CRNA and Surgeon  Anesthesia Plan Comments: (See PAT note written 04/01/2019 by Myra Gianotti, PA-C. )      Anesthesia Quick Evaluation

## 2019-04-02 DIAGNOSIS — D689 Coagulation defect, unspecified: Secondary | ICD-10-CM | POA: Diagnosis not present

## 2019-04-02 DIAGNOSIS — D631 Anemia in chronic kidney disease: Secondary | ICD-10-CM | POA: Diagnosis not present

## 2019-04-02 DIAGNOSIS — Z992 Dependence on renal dialysis: Secondary | ICD-10-CM | POA: Diagnosis not present

## 2019-04-02 DIAGNOSIS — D509 Iron deficiency anemia, unspecified: Secondary | ICD-10-CM | POA: Diagnosis not present

## 2019-04-02 DIAGNOSIS — N2581 Secondary hyperparathyroidism of renal origin: Secondary | ICD-10-CM | POA: Diagnosis not present

## 2019-04-02 DIAGNOSIS — N186 End stage renal disease: Secondary | ICD-10-CM | POA: Diagnosis not present

## 2019-04-04 ENCOUNTER — Encounter (HOSPITAL_COMMUNITY): Payer: Self-pay | Admitting: Vascular Surgery

## 2019-04-04 ENCOUNTER — Other Ambulatory Visit: Payer: Self-pay

## 2019-04-04 ENCOUNTER — Ambulatory Visit (HOSPITAL_COMMUNITY): Payer: Medicare Other | Admitting: Physician Assistant

## 2019-04-04 ENCOUNTER — Ambulatory Visit (HOSPITAL_COMMUNITY)
Admission: RE | Admit: 2019-04-04 | Discharge: 2019-04-04 | Disposition: A | Discharge status: Home / Self Care | Payer: Medicare Other | Attending: Vascular Surgery | Admitting: Vascular Surgery

## 2019-04-04 ENCOUNTER — Encounter (HOSPITAL_COMMUNITY)
Admission: RE | Disposition: A | Discharge status: Home / Self Care | Payer: Self-pay | Source: Home / Self Care | Attending: Vascular Surgery

## 2019-04-04 DIAGNOSIS — Z992 Dependence on renal dialysis: Secondary | ICD-10-CM | POA: Diagnosis not present

## 2019-04-04 DIAGNOSIS — N184 Chronic kidney disease, stage 4 (severe): Secondary | ICD-10-CM

## 2019-04-04 DIAGNOSIS — D631 Anemia in chronic kidney disease: Secondary | ICD-10-CM | POA: Diagnosis not present

## 2019-04-04 DIAGNOSIS — N185 Chronic kidney disease, stage 5: Secondary | ICD-10-CM | POA: Diagnosis not present

## 2019-04-04 DIAGNOSIS — I120 Hypertensive chronic kidney disease with stage 5 chronic kidney disease or end stage renal disease: Secondary | ICD-10-CM | POA: Diagnosis not present

## 2019-04-04 DIAGNOSIS — E1122 Type 2 diabetes mellitus with diabetic chronic kidney disease: Secondary | ICD-10-CM | POA: Diagnosis not present

## 2019-04-04 DIAGNOSIS — N179 Acute kidney failure, unspecified: Secondary | ICD-10-CM | POA: Diagnosis not present

## 2019-04-04 DIAGNOSIS — N186 End stage renal disease: Secondary | ICD-10-CM | POA: Insufficient documentation

## 2019-04-04 DIAGNOSIS — Z7982 Long term (current) use of aspirin: Secondary | ICD-10-CM | POA: Insufficient documentation

## 2019-04-04 HISTORY — DX: Unspecified osteoarthritis, unspecified site: M19.90

## 2019-04-04 HISTORY — PX: BASCILIC VEIN TRANSPOSITION: SHX5742

## 2019-04-04 LAB — GLUCOSE, CAPILLARY
Glucose-Capillary: 81 mg/dL (ref 70–99)
Glucose-Capillary: 102 mg/dL — ABNORMAL HIGH (ref 70–99)

## 2019-04-04 LAB — POCT I-STAT, CHEM 8
BUN: 56 mg/dL — ABNORMAL HIGH (ref 8–23)
Calcium, Ion: 1.08 mmol/L — ABNORMAL LOW (ref 1.15–1.40)
Chloride: 97 mmol/L — ABNORMAL LOW (ref 98–111)
Creatinine, Ser: 10.1 mg/dL — ABNORMAL HIGH (ref 0.61–1.24)
Glucose, Bld: 85 mg/dL (ref 70–99)
HCT: 36 % — ABNORMAL LOW (ref 39.0–52.0)
Hemoglobin: 12.2 g/dL — ABNORMAL LOW (ref 13.0–17.0)
Potassium: 4.6 mmol/L (ref 3.5–5.1)
Sodium: 137 mmol/L (ref 135–145)
TCO2: 30 mmol/L (ref 22–32)

## 2019-04-04 SURGERY — TRANSPOSITION, VEIN, BASILIC
Anesthesia: General | Site: Arm Upper | Laterality: Left

## 2019-04-04 MED ORDER — MIDAZOLAM HCL 2 MG/2ML IJ SOLN: 0.5000 mg | Freq: Once | INTRAMUSCULAR | Status: DC | PRN

## 2019-04-04 MED ORDER — HEPARIN SODIUM (PORCINE) 1000 UNIT/ML IJ SOLN
INTRAMUSCULAR | Status: DC | PRN
  Administered 2019-04-04: 8000 [IU] via INTRAVENOUS

## 2019-04-04 MED ORDER — PROPOFOL 1000 MG/100ML IV EMUL
INTRAVENOUS | Status: AC
Start: 2019-04-04 — End: ?
  Filled 2019-04-04: qty 100

## 2019-04-04 MED ORDER — FENTANYL CITRATE (PF) 100 MCG/2ML IJ SOLN: 25.0000 ug | INTRAMUSCULAR | Status: DC | PRN

## 2019-04-04 MED ORDER — PROTAMINE SULFATE 10 MG/ML IV SOLN
INTRAVENOUS | Status: AC
  Filled 2019-04-04: qty 10

## 2019-04-04 MED ORDER — ONDANSETRON HCL 4 MG/2ML IJ SOLN
INTRAMUSCULAR | Status: DC | PRN
  Administered 2019-04-04: 4 mg via INTRAVENOUS

## 2019-04-04 MED ORDER — PROPOFOL 500 MG/50ML IV EMUL
INTRAVENOUS | Status: DC | PRN
  Administered 2019-04-04: 50 ug/kg/min via INTRAVENOUS

## 2019-04-04 MED ORDER — LIDOCAINE HCL 1 % IJ SOLN
INTRAMUSCULAR | Status: AC
  Filled 2019-04-04: qty 20

## 2019-04-04 MED ORDER — CHLORHEXIDINE GLUCONATE 4 % EX LIQD: 60.0000 mL | Freq: Once | CUTANEOUS | Status: DC

## 2019-04-04 MED ORDER — FENTANYL CITRATE (PF) 100 MCG/2ML IJ SOLN
INTRAMUSCULAR | Status: DC | PRN
  Administered 2019-04-04 (×3): 25 ug via INTRAVENOUS

## 2019-04-04 MED ORDER — PROPOFOL 10 MG/ML IV BOLUS
INTRAVENOUS | Status: AC
  Filled 2019-04-04: qty 40

## 2019-04-04 MED ORDER — SODIUM CHLORIDE 0.9 % IV SOLN
INTRAVENOUS | Status: AC
  Filled 2019-04-04: qty 1.2

## 2019-04-04 MED ORDER — SODIUM CHLORIDE 0.9 % IV SOLN: INTRAVENOUS | Status: DC

## 2019-04-04 MED ORDER — MIDAZOLAM HCL 2 MG/2ML IJ SOLN
INTRAMUSCULAR | Status: AC
  Filled 2019-04-04: qty 2

## 2019-04-04 MED ORDER — LIDOCAINE-EPINEPHRINE (PF) 1 %-1:200000 IJ SOLN
INTRAMUSCULAR | Status: DC | PRN
  Administered 2019-04-04 (×2): 20 mL

## 2019-04-04 MED ORDER — PROTAMINE SULFATE 10 MG/ML IV SOLN
INTRAVENOUS | Status: DC | PRN
  Administered 2019-04-04: 40 mg via INTRAVENOUS

## 2019-04-04 MED ORDER — SODIUM CHLORIDE 0.9 % IV SOLN
INTRAVENOUS | Status: DC | PRN
  Administered 2019-04-04: 500 mL

## 2019-04-04 MED ORDER — HEPARIN SODIUM (PORCINE) 1000 UNIT/ML IJ SOLN
INTRAMUSCULAR | Status: AC
  Filled 2019-04-04: qty 1

## 2019-04-04 MED ORDER — LIDOCAINE 2% (20 MG/ML) 5 ML SYRINGE
INTRAMUSCULAR | Status: DC | PRN
  Administered 2019-04-04: 60 mg via INTRAVENOUS

## 2019-04-04 MED ORDER — CEFAZOLIN SODIUM-DEXTROSE 2-4 GM/100ML-% IV SOLN
2.0000 g | INTRAVENOUS | Status: AC
  Administered 2019-04-04: 2 g via INTRAVENOUS

## 2019-04-04 MED ORDER — CEFAZOLIN SODIUM-DEXTROSE 2-4 GM/100ML-% IV SOLN
INTRAVENOUS | Status: AC
  Filled 2019-04-04: qty 100

## 2019-04-04 MED ORDER — OXYCODONE-ACETAMINOPHEN 7.5-325 MG PO TABS: 1.0000 | ORAL_TABLET | ORAL | 0 refills | Status: DC | PRN

## 2019-04-04 MED ORDER — PROPOFOL 10 MG/ML IV BOLUS
INTRAVENOUS | Status: DC | PRN
  Administered 2019-04-04: 100 mg via INTRAVENOUS

## 2019-04-04 MED ORDER — LIDOCAINE-EPINEPHRINE 1 %-1:100000 IJ SOLN
INTRAMUSCULAR | Status: AC
  Filled 2019-04-04: qty 3

## 2019-04-04 MED ORDER — 0.9 % SODIUM CHLORIDE (POUR BTL) OPTIME
TOPICAL | Status: DC | PRN
  Administered 2019-04-04: 1000 mL

## 2019-04-04 MED ORDER — FENTANYL CITRATE (PF) 250 MCG/5ML IJ SOLN
INTRAMUSCULAR | Status: AC
  Filled 2019-04-04: qty 5

## 2019-04-04 MED ORDER — MEPERIDINE HCL 25 MG/ML IJ SOLN: 6.2500 mg | INTRAMUSCULAR | Status: DC | PRN

## 2019-04-04 MED ORDER — PROMETHAZINE HCL 25 MG/ML IJ SOLN: 6.2500 mg | INTRAMUSCULAR | Status: DC | PRN

## 2019-04-04 MED ORDER — ONDANSETRON HCL 4 MG/2ML IJ SOLN
INTRAMUSCULAR | Status: AC
  Filled 2019-04-04: qty 2

## 2019-04-04 MED ORDER — LIDOCAINE 2% (20 MG/ML) 5 ML SYRINGE
INTRAMUSCULAR | Status: AC
  Filled 2019-04-04: qty 5

## 2019-04-04 MED ORDER — MIDAZOLAM HCL 5 MG/5ML IJ SOLN
INTRAMUSCULAR | Status: DC | PRN
  Administered 2019-04-04: 2 mg via INTRAVENOUS

## 2019-04-04 MED ORDER — LIDOCAINE-EPINEPHRINE 1 %-1:100000 IJ SOLN
INTRAMUSCULAR | Status: AC
  Filled 2019-04-04: qty 1

## 2019-04-04 SURGICAL SUPPLY — 62 items
ADH SKN CLS APL DERMABOND .7 (GAUZE/BANDAGES/DRESSINGS) ×2
APL PRP STRL LF DISP 70% ISPRP (MISCELLANEOUS) ×2
ARMBAND PINK RESTRICT EXTREMIT (MISCELLANEOUS) ×3 IMPLANT
BAG DECANTER FOR FLEXI CONT (MISCELLANEOUS) ×3 IMPLANT
BIOPATCH RED 1 DISK 7.0 (GAUZE/BANDAGES/DRESSINGS) ×3 IMPLANT
CANISTER SUCT 3000ML PPV (MISCELLANEOUS) ×3 IMPLANT
CANNULA VESSEL 3MM 2 BLNT TIP (CANNULA) ×3 IMPLANT
CATH PALINDROME RT-P 15FX19CM (CATHETERS) IMPLANT
CATH PALINDROME RT-P 15FX23CM (CATHETERS) IMPLANT
CATH PALINDROME RT-P 15FX28CM (CATHETERS) IMPLANT
CATH PALINDROME RT-P 15FX55CM (CATHETERS) IMPLANT
CHLORAPREP W/TINT 26 (MISCELLANEOUS) ×3 IMPLANT
CLIP VESOCCLUDE MED 24/CT (CLIP) ×3 IMPLANT
CLIP VESOCCLUDE SM WIDE 24/CT (CLIP) ×3 IMPLANT
COVER PROBE W GEL 5X96 (DRAPES) IMPLANT
COVER SURGICAL LIGHT HANDLE (MISCELLANEOUS) ×3 IMPLANT
COVER WAND RF STERILE (DRAPES) ×3 IMPLANT
DECANTER SPIKE VIAL GLASS SM (MISCELLANEOUS) ×5 IMPLANT
DERMABOND ADVANCED (GAUZE/BANDAGES/DRESSINGS) ×1
DERMABOND ADVANCED .7 DNX12 (GAUZE/BANDAGES/DRESSINGS) ×2 IMPLANT
DRAPE C-ARM 42X72 X-RAY (DRAPES) ×3 IMPLANT
DRAPE CHEST BREAST 15X10 FENES (DRAPES) ×3 IMPLANT
DRAPE HALF SHEET 40X57 (DRAPES) ×2 IMPLANT
ELECT REM PT RETURN 9FT ADLT (ELECTROSURGICAL) ×3
ELECTRODE REM PT RTRN 9FT ADLT (ELECTROSURGICAL) ×2 IMPLANT
GAUZE 4X4 16PLY RFD (DISPOSABLE) ×3 IMPLANT
GAUZE SPONGE 4X4 16PLY XRAY LF (GAUZE/BANDAGES/DRESSINGS) ×2 IMPLANT
GLOVE BIO SURGEON STRL SZ 6.5 (GLOVE) ×4 IMPLANT
GLOVE BIO SURGEON STRL SZ7.5 (GLOVE) ×3 IMPLANT
GLOVE BIOGEL PI IND STRL 7.0 (GLOVE) ×2 IMPLANT
GLOVE BIOGEL PI IND STRL 8 (GLOVE) ×2 IMPLANT
GLOVE BIOGEL PI INDICATOR 7.0 (GLOVE) ×2
GLOVE BIOGEL PI INDICATOR 8 (GLOVE) ×1
GLOVE SURG SS PI 6.5 STRL IVOR (GLOVE) ×2 IMPLANT
GLOVE SURG SS PI 7.0 STRL IVOR (GLOVE) ×2 IMPLANT
GOWN STRL REUS W/ TWL LRG LVL3 (GOWN DISPOSABLE) ×7 IMPLANT
GOWN STRL REUS W/TWL LRG LVL3 (GOWN DISPOSABLE) ×12
KIT BASIN OR (CUSTOM PROCEDURE TRAY) ×3 IMPLANT
KIT TURNOVER KIT B (KITS) ×3 IMPLANT
NDL 18GX1X1/2 (RX/OR ONLY) (NEEDLE) ×1 IMPLANT
NDL HYPO 25GX1X1/2 BEV (NEEDLE) ×1 IMPLANT
NEEDLE 18GX1X1/2 (RX/OR ONLY) (NEEDLE) ×3 IMPLANT
NEEDLE HYPO 25GX1X1/2 BEV (NEEDLE) ×3 IMPLANT
NS IRRIG 1000ML POUR BTL (IV SOLUTION) ×3 IMPLANT
PACK CV ACCESS (CUSTOM PROCEDURE TRAY) ×3 IMPLANT
PACK SURGICAL SETUP 50X90 (CUSTOM PROCEDURE TRAY) ×3 IMPLANT
PAD ARMBOARD 7.5X6 YLW CONV (MISCELLANEOUS) ×6 IMPLANT
SPONGE SURGIFOAM ABS GEL 100 (HEMOSTASIS) IMPLANT
SUT ETHILON 3 0 PS 1 (SUTURE) ×3 IMPLANT
SUT PROLENE 6 0 BV (SUTURE) ×8 IMPLANT
SUT SILK 2 0 SH (SUTURE) ×4 IMPLANT
SUT VIC AB 3-0 SH 27 (SUTURE) ×6
SUT VIC AB 3-0 SH 27X BRD (SUTURE) ×4 IMPLANT
SUT VICRYL 4-0 PS2 18IN ABS (SUTURE) ×6 IMPLANT
SYR 10ML LL (SYRINGE) ×3 IMPLANT
SYR 20ML LL LF (SYRINGE) ×6 IMPLANT
SYR 5ML LL (SYRINGE) ×6 IMPLANT
SYR CONTROL 10ML LL (SYRINGE) ×3 IMPLANT
TOWEL GREEN STERILE (TOWEL DISPOSABLE) ×6 IMPLANT
TOWEL GREEN STERILE FF (TOWEL DISPOSABLE) ×3 IMPLANT
UNDERPAD 30X30 (UNDERPADS AND DIAPERS) ×3 IMPLANT
WATER STERILE IRR 1000ML POUR (IV SOLUTION) ×3 IMPLANT

## 2019-04-04 NOTE — Interval H&P Note (Signed)
History and Physical Interval Note:  04/04/2019 7:17 AM  Matthew [name]  has presented today for surgery, with the diagnosis of END STAGE RENAL DISEASE.  The various methods of treatment have been discussed with the patient and family. After consideration of risks, benefits and other options for treatment, the patient has consented to  Procedure(s): [place] (Left) INSERTION OF DIALYSIS CATHETER EXCHANGE (N/A) as a surgical intervention.  The patient's history has been reviewed, patient examined, no change in status, stable for surgery.  I have reviewed the patient's chart and labs.  Questions were answered to the patient's satisfaction.     Deitra Mayo

## 2019-04-04 NOTE — Discharge Instructions (Signed)
° °  [facility] [place] ° °Discharge Instructions ° °AV Fistula or Graft Surgery for Dialysis Access ° °Please refer to the following instructions for your post-procedure care. Your surgeon or physician assistant will discuss any changes with you. ° °Activity ° °You may drive the day following your surgery, if you are comfortable and no longer taking prescription pain medication. Resume full activity as the soreness in your incision resolves. ° °Bathing/Showering ° °You may shower after you go home. Keep your incision dry for 48 hours. Do not soak in a bathtub, hot tub, or swim until the incision heals completely. You may not shower if you have a hemodialysis catheter. ° °Incision Care ° °Clean your incision with mild soap and water after 48 hours. Pat the area dry with a clean towel. You do not need a bandage unless otherwise instructed. Do not apply any ointments or creams to your incision. You may have skin glue on your incision. Do not peel it off. It will come off on its own in about one week. Your arm may swell a bit after surgery. To reduce swelling use pillows to elevate your arm so it is above your heart. Your doctor will tell you if you need to lightly wrap your arm with an ACE bandage. ° °Diet ° °Resume your normal diet. There are not special food restrictions following this procedure. In order to heal from your surgery, it is CRITICAL to get adequate nutrition. Your body requires vitamins, minerals, and protein. Vegetables are the best source of vitamins and minerals. Vegetables also provide the perfect balance of protein. Processed food has little nutritional value, so try to avoid this. ° °Medications ° °Resume taking all of your medications. If your incision is causing pain, you may take over-the counter pain relievers such as acetaminophen (Tylenol). If you were prescribed a stronger pain medication, please be aware these medications can cause nausea and constipation. Prevent  nausea by taking the medication with a snack or meal. Avoid constipation by drinking plenty of fluids and eating foods with high amount of fiber, such as fruits, vegetables, and grains. Do not take Tylenol if you are taking prescription pain medications. ° ° ° ° °Follow up °Your surgeon may want to see you in the office following your access surgery. If so, this will be arranged at the time of your surgery. ° °Please call us immediately for any of the following conditions: ° °Increased pain, redness, drainage (pus) from your incision site °Fever of 101 degrees or higher °Severe or worsening pain at your incision site °Hand pain or numbness. ° °Reduce your risk of vascular disease: ° °Stop smoking. If you would like help, call [facility] at 1-800-QUIT-NOW (1-800-784-8669) or [facility] at 336-586-4000 ° °Manage your cholesterol °Maintain a desired weight °Control your diabetes °Keep your blood pressure down ° °Dialysis ° °It will take several weeks to several months for your new dialysis access to be ready for use. Your surgeon will determine when it is OK to use it. Your nephrologist will continue to direct your dialysis. You can continue to use your Permcath until your new access is ready for use. ° °If you have any questions, please call the office at [phone]. ° °

## 2019-04-04 NOTE — Transfer of Care (Cosign Needed)
Immediate Anesthesia Transfer of Care Note  Patient: Matthew [name]  Procedure(s) Performed: [place] (Left Arm Upper)  Patient Location: PACU  Anesthesia Type:General  Level of Consciousness: awake and alert   Airway & Oxygen Therapy: Patient Spontanous Breathing and Patient connected to face mask oxygen  Post-op Assessment: Report given to RN and Post -op Vital signs reviewed and stable  Post vital signs: Reviewed and stable  Last Vitals:  Vitals Value Taken Time  BP 147/61 04/04/19 0937  Temp    Pulse 80 04/04/19 0939  Resp 10 04/04/19 0939  SpO2 99 % 04/04/19 0939  Vitals shown include unvalidated device data.  Last Pain:  Vitals:   04/04/19 0635  TempSrc:   PainSc: 0-No pain      Patients Stated Pain Goal: 3 (52/48/18 5909)  Complications: No apparent anesthesia complications

## 2019-04-04 NOTE — Anesthesia Postprocedure Evaluation (Signed)
Anesthesia Post Note  Patient: Matthew [name]  Procedure(s) Performed: [place] (Left Arm Upper)     Patient location during evaluation: PACU Anesthesia Type: General Level of consciousness: awake and alert, patient cooperative and oriented Pain management: pain level controlled Vital Signs Assessment: post-procedure vital signs reviewed and stable Respiratory status: spontaneous breathing, nonlabored ventilation and respiratory function stable Cardiovascular status: blood pressure returned to baseline and stable Postop Assessment: no apparent nausea or vomiting Anesthetic complications: no    Last Vitals:  Vitals:   04/04/19 0935 04/04/19 0950  BP: (!) 147/61 (!) 145/56  Pulse: 82 81  Resp: 13 13  Temp: 36.4 C 36.7 C  SpO2: 99% 95%    Last Pain:  Vitals:   04/04/19 0935  TempSrc:   PainSc: 3                  Iasha Mccalister,E. Daley Mooradian

## 2019-04-04 NOTE — Op Note (Signed)
    NAME: Matthew [name]    MRN: [id number] DOB: December 24, 1950    DATE OF OPERATION: 04/04/2019  PREOP DIAGNOSIS:    End-stage renal disease  POSTOP DIAGNOSIS:    Same  PROCEDURE:    Left second stage basilic vein transposition  SURGEON: Judeth Cornfield. Scot Dock, MD  ASSIST: Karoline Caldwell, PAC  ANESTHESIA: Local with sedation converted to general  EBL: Minimal  INDICATIONS:    Garison Genova is a 69 y.o. male who had a for stage basilic vein transposition in September of last year.  He subsequently showed up for follow-up and I recommended second stage basilic vein transposition  FINDINGS:   The vein was reasonable in size and should be ready for cannulation in 4 to 6 weeks.  TECHNIQUE:   The patient was taken to the operating room and sedated by anesthesia.  The left arm was prepped and draped in the usual sterile fashion.  After the skin was anesthetized incision over the proximal fistula was dissected free.  The incision was extended slightly laterally to allow exposure of the arterial anastomosis.  The vein was dilated dissected free circumferentially with branches divided between clips and 3-0 silk ties.  A separate incision was made in the proximal upper arm after the skin was anesthetized.  The vein was dissected free up to where it joined the brachial vein in the axilla.  The tunnel was then created from the distal incision to the proximal incision and the patient was heparinized.  The vein was clamped just above the anastomosis and divided.  It was slightly spatulated.  The vein was distended up with heparinized saline and then marked to prevent twisting.  It was then brought through the tunnel for anastomosis back to the proximal vein.  This anastomosis was done with two 6-0 Prolene suture.  So the completion was an excellent thrill in the fistula.  Hemostasis was obtained in the wounds and the heparin was partially reversed with protamine.  The wounds were closed with a deep  layer of 3-0 Vicryl and the skin closed with 4-0 Vicryl.  Dermabond was applied.  The patient tolerated the procedure well and was transferred to the recovery room in stable condition.  All needle and sponge counts were correct.  Deitra Mayo, MD, FACS [facility]  DATE OF DICTATION:   04/04/2019

## 2019-04-05 DIAGNOSIS — N186 End stage renal disease: Secondary | ICD-10-CM | POA: Diagnosis not present

## 2019-04-05 DIAGNOSIS — N2581 Secondary hyperparathyroidism of renal origin: Secondary | ICD-10-CM | POA: Diagnosis not present

## 2019-04-05 DIAGNOSIS — Z992 Dependence on renal dialysis: Secondary | ICD-10-CM | POA: Diagnosis not present

## 2019-04-05 DIAGNOSIS — D509 Iron deficiency anemia, unspecified: Secondary | ICD-10-CM | POA: Diagnosis not present

## 2019-04-05 DIAGNOSIS — D689 Coagulation defect, unspecified: Secondary | ICD-10-CM | POA: Diagnosis not present

## 2019-04-05 DIAGNOSIS — D631 Anemia in chronic kidney disease: Secondary | ICD-10-CM | POA: Diagnosis not present

## 2019-04-07 DIAGNOSIS — D689 Coagulation defect, unspecified: Secondary | ICD-10-CM | POA: Diagnosis not present

## 2019-04-07 DIAGNOSIS — D509 Iron deficiency anemia, unspecified: Secondary | ICD-10-CM | POA: Diagnosis not present

## 2019-04-07 DIAGNOSIS — N186 End stage renal disease: Secondary | ICD-10-CM | POA: Diagnosis not present

## 2019-04-07 DIAGNOSIS — D631 Anemia in chronic kidney disease: Secondary | ICD-10-CM | POA: Diagnosis not present

## 2019-04-07 DIAGNOSIS — N2581 Secondary hyperparathyroidism of renal origin: Secondary | ICD-10-CM | POA: Diagnosis not present

## 2019-04-07 DIAGNOSIS — Z992 Dependence on renal dialysis: Secondary | ICD-10-CM | POA: Diagnosis not present

## 2019-04-09 DIAGNOSIS — N2581 Secondary hyperparathyroidism of renal origin: Secondary | ICD-10-CM | POA: Diagnosis not present

## 2019-04-09 DIAGNOSIS — D631 Anemia in chronic kidney disease: Secondary | ICD-10-CM | POA: Diagnosis not present

## 2019-04-09 DIAGNOSIS — Z992 Dependence on renal dialysis: Secondary | ICD-10-CM | POA: Diagnosis not present

## 2019-04-09 DIAGNOSIS — N186 End stage renal disease: Secondary | ICD-10-CM | POA: Diagnosis not present

## 2019-04-09 DIAGNOSIS — D509 Iron deficiency anemia, unspecified: Secondary | ICD-10-CM | POA: Diagnosis not present

## 2019-04-09 DIAGNOSIS — D689 Coagulation defect, unspecified: Secondary | ICD-10-CM | POA: Diagnosis not present

## 2019-04-12 DIAGNOSIS — D631 Anemia in chronic kidney disease: Secondary | ICD-10-CM | POA: Diagnosis not present

## 2019-04-12 DIAGNOSIS — N186 End stage renal disease: Secondary | ICD-10-CM | POA: Diagnosis not present

## 2019-04-12 DIAGNOSIS — Z992 Dependence on renal dialysis: Secondary | ICD-10-CM | POA: Diagnosis not present

## 2019-04-12 DIAGNOSIS — N2581 Secondary hyperparathyroidism of renal origin: Secondary | ICD-10-CM | POA: Diagnosis not present

## 2019-04-12 DIAGNOSIS — D689 Coagulation defect, unspecified: Secondary | ICD-10-CM | POA: Diagnosis not present

## 2019-04-12 DIAGNOSIS — D509 Iron deficiency anemia, unspecified: Secondary | ICD-10-CM | POA: Diagnosis not present

## 2019-04-14 DIAGNOSIS — D509 Iron deficiency anemia, unspecified: Secondary | ICD-10-CM | POA: Diagnosis not present

## 2019-04-14 DIAGNOSIS — Z992 Dependence on renal dialysis: Secondary | ICD-10-CM | POA: Diagnosis not present

## 2019-04-14 DIAGNOSIS — D631 Anemia in chronic kidney disease: Secondary | ICD-10-CM | POA: Diagnosis not present

## 2019-04-14 DIAGNOSIS — D689 Coagulation defect, unspecified: Secondary | ICD-10-CM | POA: Diagnosis not present

## 2019-04-14 DIAGNOSIS — N2581 Secondary hyperparathyroidism of renal origin: Secondary | ICD-10-CM | POA: Diagnosis not present

## 2019-04-14 DIAGNOSIS — N186 End stage renal disease: Secondary | ICD-10-CM | POA: Diagnosis not present

## 2019-04-16 DIAGNOSIS — D631 Anemia in chronic kidney disease: Secondary | ICD-10-CM | POA: Diagnosis not present

## 2019-04-16 DIAGNOSIS — N186 End stage renal disease: Secondary | ICD-10-CM | POA: Diagnosis not present

## 2019-04-16 DIAGNOSIS — D509 Iron deficiency anemia, unspecified: Secondary | ICD-10-CM | POA: Diagnosis not present

## 2019-04-16 DIAGNOSIS — D689 Coagulation defect, unspecified: Secondary | ICD-10-CM | POA: Diagnosis not present

## 2019-04-16 DIAGNOSIS — Z992 Dependence on renal dialysis: Secondary | ICD-10-CM | POA: Diagnosis not present

## 2019-04-16 DIAGNOSIS — N2581 Secondary hyperparathyroidism of renal origin: Secondary | ICD-10-CM | POA: Diagnosis not present

## 2019-04-19 DIAGNOSIS — N2581 Secondary hyperparathyroidism of renal origin: Secondary | ICD-10-CM | POA: Diagnosis not present

## 2019-04-19 DIAGNOSIS — D509 Iron deficiency anemia, unspecified: Secondary | ICD-10-CM | POA: Diagnosis not present

## 2019-04-19 DIAGNOSIS — N186 End stage renal disease: Secondary | ICD-10-CM | POA: Diagnosis not present

## 2019-04-19 DIAGNOSIS — D689 Coagulation defect, unspecified: Secondary | ICD-10-CM | POA: Diagnosis not present

## 2019-04-19 DIAGNOSIS — Z992 Dependence on renal dialysis: Secondary | ICD-10-CM | POA: Diagnosis not present

## 2019-04-19 DIAGNOSIS — D631 Anemia in chronic kidney disease: Secondary | ICD-10-CM | POA: Diagnosis not present

## 2019-04-21 DIAGNOSIS — Z992 Dependence on renal dialysis: Secondary | ICD-10-CM | POA: Diagnosis not present

## 2019-04-21 DIAGNOSIS — D631 Anemia in chronic kidney disease: Secondary | ICD-10-CM | POA: Diagnosis not present

## 2019-04-21 DIAGNOSIS — D689 Coagulation defect, unspecified: Secondary | ICD-10-CM | POA: Diagnosis not present

## 2019-04-21 DIAGNOSIS — D509 Iron deficiency anemia, unspecified: Secondary | ICD-10-CM | POA: Diagnosis not present

## 2019-04-21 DIAGNOSIS — N2581 Secondary hyperparathyroidism of renal origin: Secondary | ICD-10-CM | POA: Diagnosis not present

## 2019-04-21 DIAGNOSIS — N186 End stage renal disease: Secondary | ICD-10-CM | POA: Diagnosis not present

## 2019-04-23 DIAGNOSIS — Z992 Dependence on renal dialysis: Secondary | ICD-10-CM | POA: Diagnosis not present

## 2019-04-23 DIAGNOSIS — D631 Anemia in chronic kidney disease: Secondary | ICD-10-CM | POA: Diagnosis not present

## 2019-04-23 DIAGNOSIS — D689 Coagulation defect, unspecified: Secondary | ICD-10-CM | POA: Diagnosis not present

## 2019-04-23 DIAGNOSIS — N186 End stage renal disease: Secondary | ICD-10-CM | POA: Diagnosis not present

## 2019-04-23 DIAGNOSIS — N2581 Secondary hyperparathyroidism of renal origin: Secondary | ICD-10-CM | POA: Diagnosis not present

## 2019-04-23 DIAGNOSIS — D509 Iron deficiency anemia, unspecified: Secondary | ICD-10-CM | POA: Diagnosis not present

## 2019-04-26 DIAGNOSIS — N2581 Secondary hyperparathyroidism of renal origin: Secondary | ICD-10-CM | POA: Diagnosis not present

## 2019-04-26 DIAGNOSIS — N186 End stage renal disease: Secondary | ICD-10-CM | POA: Diagnosis not present

## 2019-04-26 DIAGNOSIS — D509 Iron deficiency anemia, unspecified: Secondary | ICD-10-CM | POA: Diagnosis not present

## 2019-04-26 DIAGNOSIS — D689 Coagulation defect, unspecified: Secondary | ICD-10-CM | POA: Diagnosis not present

## 2019-04-26 DIAGNOSIS — D631 Anemia in chronic kidney disease: Secondary | ICD-10-CM | POA: Diagnosis not present

## 2019-04-26 DIAGNOSIS — Z992 Dependence on renal dialysis: Secondary | ICD-10-CM | POA: Diagnosis not present

## 2019-04-28 DIAGNOSIS — Z23 Encounter for immunization: Secondary | ICD-10-CM | POA: Diagnosis not present

## 2019-04-28 DIAGNOSIS — N138 Other obstructive and reflux uropathy: Secondary | ICD-10-CM | POA: Diagnosis not present

## 2019-04-28 DIAGNOSIS — N2581 Secondary hyperparathyroidism of renal origin: Secondary | ICD-10-CM | POA: Diagnosis not present

## 2019-04-28 DIAGNOSIS — N186 End stage renal disease: Secondary | ICD-10-CM | POA: Diagnosis not present

## 2019-04-28 DIAGNOSIS — Z992 Dependence on renal dialysis: Secondary | ICD-10-CM | POA: Diagnosis not present

## 2019-04-28 DIAGNOSIS — D509 Iron deficiency anemia, unspecified: Secondary | ICD-10-CM | POA: Diagnosis not present

## 2019-04-28 DIAGNOSIS — D631 Anemia in chronic kidney disease: Secondary | ICD-10-CM | POA: Diagnosis not present

## 2019-04-30 DIAGNOSIS — N2581 Secondary hyperparathyroidism of renal origin: Secondary | ICD-10-CM | POA: Diagnosis not present

## 2019-04-30 DIAGNOSIS — D631 Anemia in chronic kidney disease: Secondary | ICD-10-CM | POA: Diagnosis not present

## 2019-04-30 DIAGNOSIS — Z992 Dependence on renal dialysis: Secondary | ICD-10-CM | POA: Diagnosis not present

## 2019-04-30 DIAGNOSIS — Z23 Encounter for immunization: Secondary | ICD-10-CM | POA: Diagnosis not present

## 2019-04-30 DIAGNOSIS — D509 Iron deficiency anemia, unspecified: Secondary | ICD-10-CM | POA: Diagnosis not present

## 2019-04-30 DIAGNOSIS — N186 End stage renal disease: Secondary | ICD-10-CM | POA: Diagnosis not present

## 2019-05-03 DIAGNOSIS — D509 Iron deficiency anemia, unspecified: Secondary | ICD-10-CM | POA: Diagnosis not present

## 2019-05-03 DIAGNOSIS — D631 Anemia in chronic kidney disease: Secondary | ICD-10-CM | POA: Diagnosis not present

## 2019-05-03 DIAGNOSIS — N186 End stage renal disease: Secondary | ICD-10-CM | POA: Diagnosis not present

## 2019-05-03 DIAGNOSIS — Z23 Encounter for immunization: Secondary | ICD-10-CM | POA: Diagnosis not present

## 2019-05-03 DIAGNOSIS — Z992 Dependence on renal dialysis: Secondary | ICD-10-CM | POA: Diagnosis not present

## 2019-05-03 DIAGNOSIS — N2581 Secondary hyperparathyroidism of renal origin: Secondary | ICD-10-CM | POA: Diagnosis not present

## 2019-05-05 DIAGNOSIS — Z23 Encounter for immunization: Secondary | ICD-10-CM | POA: Diagnosis not present

## 2019-05-05 DIAGNOSIS — D631 Anemia in chronic kidney disease: Secondary | ICD-10-CM | POA: Diagnosis not present

## 2019-05-05 DIAGNOSIS — N2581 Secondary hyperparathyroidism of renal origin: Secondary | ICD-10-CM | POA: Diagnosis not present

## 2019-05-05 DIAGNOSIS — D509 Iron deficiency anemia, unspecified: Secondary | ICD-10-CM | POA: Diagnosis not present

## 2019-05-05 DIAGNOSIS — N186 End stage renal disease: Secondary | ICD-10-CM | POA: Diagnosis not present

## 2019-05-05 DIAGNOSIS — Z992 Dependence on renal dialysis: Secondary | ICD-10-CM | POA: Diagnosis not present

## 2019-05-07 DIAGNOSIS — D631 Anemia in chronic kidney disease: Secondary | ICD-10-CM | POA: Diagnosis not present

## 2019-05-07 DIAGNOSIS — Z23 Encounter for immunization: Secondary | ICD-10-CM | POA: Diagnosis not present

## 2019-05-07 DIAGNOSIS — D509 Iron deficiency anemia, unspecified: Secondary | ICD-10-CM | POA: Diagnosis not present

## 2019-05-07 DIAGNOSIS — N2581 Secondary hyperparathyroidism of renal origin: Secondary | ICD-10-CM | POA: Diagnosis not present

## 2019-05-07 DIAGNOSIS — N186 End stage renal disease: Secondary | ICD-10-CM | POA: Diagnosis not present

## 2019-05-07 DIAGNOSIS — Z992 Dependence on renal dialysis: Secondary | ICD-10-CM | POA: Diagnosis not present

## 2019-05-09 ENCOUNTER — Other Ambulatory Visit: Payer: Self-pay

## 2019-05-09 ENCOUNTER — Ambulatory Visit (INDEPENDENT_AMBULATORY_CARE_PROVIDER_SITE_OTHER): Payer: Self-pay | Admitting: Physician Assistant

## 2019-05-09 VITALS — BP 119/64 | HR 71 | Temp 97.1°F | Resp 18 | Ht 70.0 in | Wt 184.0 lb

## 2019-05-09 DIAGNOSIS — N186 End stage renal disease: Secondary | ICD-10-CM

## 2019-05-09 DIAGNOSIS — Z992 Dependence on renal dialysis: Secondary | ICD-10-CM

## 2019-05-09 NOTE — Progress Notes (Signed)
  POST OPERATIVE OFFICE NOTE    CC:  F/u for surgery  HPI:  This is a 69 y.o. male who is s/p left second stage basilic vein transposition 04/04/19 by Dr. Scot Dock. He underwent a left first stage basilic vein transposition in September of 2020.  He dialyzes on Tuesdays Thursdays and Saturdays.  He has been dialyzing with a right IJ tunneled dialysis catheter. The [facility] at last office visit was not functioning well so has since been replaced and has been working well since.  He denies any steal symptoms. He does not have any pain, coldness, numbness, or cramping of left upper extremity.   No Known Allergies  Current Outpatient Medications  Medication Sig Dispense Refill  . aspirin 325 MG tablet Take 325 mg by mouth as needed for mild pain (thin blood).    . calcitRIOL (ROCALTROL) 0.5 MCG capsule Take 1 capsule (0.5 mcg total) by mouth daily. 30 capsule 0  . folic acid (FOLVITE) 1 MG tablet Take 1 tablet (1 mg total) by mouth daily. 30 tablet 0  . Methoxy PEG-Epoetin Beta (MIRCERA IJ) Mircera    . Multiple Vitamin (MULTIVITAMIN WITH MINERALS) TABS tablet Take 1 tablet by mouth daily. 30 tablet 0  . oxyCODONE-acetaminophen (PERCOCET) 7.5-325 MG tablet Take 1 tablet by mouth every 4 (four) hours as needed for severe pain. 20 tablet 0  . RENVELA 800 MG tablet Take 3,200 mg by mouth 3 (three) times daily.    Marland Kitchen thiamine 100 MG tablet Take 1 tablet (100 mg total) by mouth daily. 30 tablet 0  . VELPHORO 500 MG chewable tablet Take 2 tablet by mouth three times a day with meals and 1 BID prn with snacks     No current facility-administered medications for this visit.     ROS:  See HPI  Physical Exam:  Vitals:   05/09/19 0810  BP: 119/64  Pulse: 71  Resp: 18  Temp: (!) 97.1 F (36.2 C)  TempSrc: Temporal  SpO2: 99%  Weight: 184 lb (83.5 kg)  Height: 5\' 10"  (1.778 m)   General: well appearing, well nourished, not in any discomfort Incision:  Left upper arm well healed, no erythema, no  swelling. Extremities:  2+ radial pulses, 2+ brachial pulses. Bilateral hands warm. Left upper arm AV fistula easily palpable with excellent thrill and good bruit Neuro: Alert and oriented   Assessment/Plan:  This is a 69 y.o. male who is s/p second stage basilic vein transposition 04/04/19 by Dr. Scot Dock. Left upper extremity has healed well. AV fistula has excellent thrill. He denies any steal symptoms. He is now 5 weeks post op. He currently is dialyzing Tues/Thurs/ Sat via right IJ TDC. He is able to start cannulating the AV fistula at next scheduled dialysis day 05/10/19.  - Patient has a tunneled dialysis catheter so once the access has been used successfully to the satisfaction of the dialysis center, the tunneled catheter can be removed at their discretion - He will follow up as needed if he develops any issues with his left upper extremity AV fistula or if he develops any steal symptoms   Karoline Caldwell, PA-C [facility] [phone]  Clinic MD:  Dr. Trula Slade

## 2019-05-10 DIAGNOSIS — D631 Anemia in chronic kidney disease: Secondary | ICD-10-CM | POA: Diagnosis not present

## 2019-05-10 DIAGNOSIS — N2581 Secondary hyperparathyroidism of renal origin: Secondary | ICD-10-CM | POA: Diagnosis not present

## 2019-05-10 DIAGNOSIS — D509 Iron deficiency anemia, unspecified: Secondary | ICD-10-CM | POA: Diagnosis not present

## 2019-05-10 DIAGNOSIS — N186 End stage renal disease: Secondary | ICD-10-CM | POA: Diagnosis not present

## 2019-05-10 DIAGNOSIS — Z992 Dependence on renal dialysis: Secondary | ICD-10-CM | POA: Diagnosis not present

## 2019-05-10 DIAGNOSIS — Z23 Encounter for immunization: Secondary | ICD-10-CM | POA: Diagnosis not present

## 2019-05-12 DIAGNOSIS — D509 Iron deficiency anemia, unspecified: Secondary | ICD-10-CM | POA: Diagnosis not present

## 2019-05-12 DIAGNOSIS — D631 Anemia in chronic kidney disease: Secondary | ICD-10-CM | POA: Diagnosis not present

## 2019-05-12 DIAGNOSIS — N2581 Secondary hyperparathyroidism of renal origin: Secondary | ICD-10-CM | POA: Diagnosis not present

## 2019-05-12 DIAGNOSIS — Z23 Encounter for immunization: Secondary | ICD-10-CM | POA: Diagnosis not present

## 2019-05-12 DIAGNOSIS — N186 End stage renal disease: Secondary | ICD-10-CM | POA: Diagnosis not present

## 2019-05-12 DIAGNOSIS — Z992 Dependence on renal dialysis: Secondary | ICD-10-CM | POA: Diagnosis not present

## 2019-05-14 DIAGNOSIS — Z992 Dependence on renal dialysis: Secondary | ICD-10-CM | POA: Diagnosis not present

## 2019-05-14 DIAGNOSIS — N2581 Secondary hyperparathyroidism of renal origin: Secondary | ICD-10-CM | POA: Diagnosis not present

## 2019-05-14 DIAGNOSIS — D631 Anemia in chronic kidney disease: Secondary | ICD-10-CM | POA: Diagnosis not present

## 2019-05-14 DIAGNOSIS — N186 End stage renal disease: Secondary | ICD-10-CM | POA: Diagnosis not present

## 2019-05-14 DIAGNOSIS — Z23 Encounter for immunization: Secondary | ICD-10-CM | POA: Diagnosis not present

## 2019-05-14 DIAGNOSIS — D509 Iron deficiency anemia, unspecified: Secondary | ICD-10-CM | POA: Diagnosis not present

## 2019-05-17 DIAGNOSIS — Z23 Encounter for immunization: Secondary | ICD-10-CM | POA: Diagnosis not present

## 2019-05-17 DIAGNOSIS — N2581 Secondary hyperparathyroidism of renal origin: Secondary | ICD-10-CM | POA: Diagnosis not present

## 2019-05-17 DIAGNOSIS — Z992 Dependence on renal dialysis: Secondary | ICD-10-CM | POA: Diagnosis not present

## 2019-05-17 DIAGNOSIS — N186 End stage renal disease: Secondary | ICD-10-CM | POA: Diagnosis not present

## 2019-05-17 DIAGNOSIS — D509 Iron deficiency anemia, unspecified: Secondary | ICD-10-CM | POA: Diagnosis not present

## 2019-05-17 DIAGNOSIS — D631 Anemia in chronic kidney disease: Secondary | ICD-10-CM | POA: Diagnosis not present

## 2019-05-19 DIAGNOSIS — N186 End stage renal disease: Secondary | ICD-10-CM | POA: Diagnosis not present

## 2019-05-19 DIAGNOSIS — D631 Anemia in chronic kidney disease: Secondary | ICD-10-CM | POA: Diagnosis not present

## 2019-05-19 DIAGNOSIS — N2581 Secondary hyperparathyroidism of renal origin: Secondary | ICD-10-CM | POA: Diagnosis not present

## 2019-05-19 DIAGNOSIS — Z23 Encounter for immunization: Secondary | ICD-10-CM | POA: Diagnosis not present

## 2019-05-19 DIAGNOSIS — D509 Iron deficiency anemia, unspecified: Secondary | ICD-10-CM | POA: Diagnosis not present

## 2019-05-19 DIAGNOSIS — Z992 Dependence on renal dialysis: Secondary | ICD-10-CM | POA: Diagnosis not present

## 2019-05-21 DIAGNOSIS — D631 Anemia in chronic kidney disease: Secondary | ICD-10-CM | POA: Diagnosis not present

## 2019-05-21 DIAGNOSIS — D509 Iron deficiency anemia, unspecified: Secondary | ICD-10-CM | POA: Diagnosis not present

## 2019-05-21 DIAGNOSIS — N186 End stage renal disease: Secondary | ICD-10-CM | POA: Diagnosis not present

## 2019-05-21 DIAGNOSIS — Z23 Encounter for immunization: Secondary | ICD-10-CM | POA: Diagnosis not present

## 2019-05-21 DIAGNOSIS — N2581 Secondary hyperparathyroidism of renal origin: Secondary | ICD-10-CM | POA: Diagnosis not present

## 2019-05-21 DIAGNOSIS — Z992 Dependence on renal dialysis: Secondary | ICD-10-CM | POA: Diagnosis not present

## 2019-05-24 DIAGNOSIS — N186 End stage renal disease: Secondary | ICD-10-CM | POA: Diagnosis not present

## 2019-05-24 DIAGNOSIS — Z23 Encounter for immunization: Secondary | ICD-10-CM | POA: Diagnosis not present

## 2019-05-24 DIAGNOSIS — D509 Iron deficiency anemia, unspecified: Secondary | ICD-10-CM | POA: Diagnosis not present

## 2019-05-24 DIAGNOSIS — D631 Anemia in chronic kidney disease: Secondary | ICD-10-CM | POA: Diagnosis not present

## 2019-05-24 DIAGNOSIS — Z992 Dependence on renal dialysis: Secondary | ICD-10-CM | POA: Diagnosis not present

## 2019-05-24 DIAGNOSIS — N2581 Secondary hyperparathyroidism of renal origin: Secondary | ICD-10-CM | POA: Diagnosis not present

## 2019-05-26 DIAGNOSIS — Z992 Dependence on renal dialysis: Secondary | ICD-10-CM | POA: Diagnosis not present

## 2019-05-26 DIAGNOSIS — D509 Iron deficiency anemia, unspecified: Secondary | ICD-10-CM | POA: Diagnosis not present

## 2019-05-26 DIAGNOSIS — Z23 Encounter for immunization: Secondary | ICD-10-CM | POA: Diagnosis not present

## 2019-05-26 DIAGNOSIS — N186 End stage renal disease: Secondary | ICD-10-CM | POA: Diagnosis not present

## 2019-05-26 DIAGNOSIS — N2581 Secondary hyperparathyroidism of renal origin: Secondary | ICD-10-CM | POA: Diagnosis not present

## 2019-05-26 DIAGNOSIS — D631 Anemia in chronic kidney disease: Secondary | ICD-10-CM | POA: Diagnosis not present

## 2019-05-27 DIAGNOSIS — Z452 Encounter for adjustment and management of vascular access device: Secondary | ICD-10-CM | POA: Diagnosis not present

## 2019-05-28 DIAGNOSIS — Z992 Dependence on renal dialysis: Secondary | ICD-10-CM | POA: Diagnosis not present

## 2019-05-28 DIAGNOSIS — D509 Iron deficiency anemia, unspecified: Secondary | ICD-10-CM | POA: Diagnosis not present

## 2019-05-28 DIAGNOSIS — Z283 Underimmunization status: Secondary | ICD-10-CM | POA: Diagnosis not present

## 2019-05-28 DIAGNOSIS — N2581 Secondary hyperparathyroidism of renal origin: Secondary | ICD-10-CM | POA: Diagnosis not present

## 2019-05-28 DIAGNOSIS — N138 Other obstructive and reflux uropathy: Secondary | ICD-10-CM | POA: Diagnosis not present

## 2019-05-28 DIAGNOSIS — Z23 Encounter for immunization: Secondary | ICD-10-CM | POA: Diagnosis not present

## 2019-05-28 DIAGNOSIS — N186 End stage renal disease: Secondary | ICD-10-CM | POA: Diagnosis not present

## 2019-05-28 DIAGNOSIS — D631 Anemia in chronic kidney disease: Secondary | ICD-10-CM | POA: Diagnosis not present

## 2019-05-31 DIAGNOSIS — N2581 Secondary hyperparathyroidism of renal origin: Secondary | ICD-10-CM | POA: Diagnosis not present

## 2019-05-31 DIAGNOSIS — D509 Iron deficiency anemia, unspecified: Secondary | ICD-10-CM | POA: Diagnosis not present

## 2019-05-31 DIAGNOSIS — D631 Anemia in chronic kidney disease: Secondary | ICD-10-CM | POA: Diagnosis not present

## 2019-05-31 DIAGNOSIS — Z283 Underimmunization status: Secondary | ICD-10-CM | POA: Diagnosis not present

## 2019-05-31 DIAGNOSIS — N186 End stage renal disease: Secondary | ICD-10-CM | POA: Diagnosis not present

## 2019-05-31 DIAGNOSIS — Z992 Dependence on renal dialysis: Secondary | ICD-10-CM | POA: Diagnosis not present

## 2019-06-02 DIAGNOSIS — N2581 Secondary hyperparathyroidism of renal origin: Secondary | ICD-10-CM | POA: Diagnosis not present

## 2019-06-02 DIAGNOSIS — D509 Iron deficiency anemia, unspecified: Secondary | ICD-10-CM | POA: Diagnosis not present

## 2019-06-02 DIAGNOSIS — Z283 Underimmunization status: Secondary | ICD-10-CM | POA: Diagnosis not present

## 2019-06-02 DIAGNOSIS — Z992 Dependence on renal dialysis: Secondary | ICD-10-CM | POA: Diagnosis not present

## 2019-06-02 DIAGNOSIS — N186 End stage renal disease: Secondary | ICD-10-CM | POA: Diagnosis not present

## 2019-06-02 DIAGNOSIS — D631 Anemia in chronic kidney disease: Secondary | ICD-10-CM | POA: Diagnosis not present

## 2019-06-04 DIAGNOSIS — N186 End stage renal disease: Secondary | ICD-10-CM | POA: Diagnosis not present

## 2019-06-04 DIAGNOSIS — D509 Iron deficiency anemia, unspecified: Secondary | ICD-10-CM | POA: Diagnosis not present

## 2019-06-04 DIAGNOSIS — Z992 Dependence on renal dialysis: Secondary | ICD-10-CM | POA: Diagnosis not present

## 2019-06-04 DIAGNOSIS — D631 Anemia in chronic kidney disease: Secondary | ICD-10-CM | POA: Diagnosis not present

## 2019-06-04 DIAGNOSIS — Z283 Underimmunization status: Secondary | ICD-10-CM | POA: Diagnosis not present

## 2019-06-04 DIAGNOSIS — N2581 Secondary hyperparathyroidism of renal origin: Secondary | ICD-10-CM | POA: Diagnosis not present

## 2019-06-08 DIAGNOSIS — T82868A Thrombosis of vascular prosthetic devices, implants and grafts, initial encounter: Secondary | ICD-10-CM | POA: Diagnosis not present

## 2019-06-08 DIAGNOSIS — N186 End stage renal disease: Secondary | ICD-10-CM | POA: Diagnosis not present

## 2019-06-08 DIAGNOSIS — I871 Compression of vein: Secondary | ICD-10-CM | POA: Diagnosis not present

## 2019-06-08 DIAGNOSIS — Z992 Dependence on renal dialysis: Secondary | ICD-10-CM | POA: Diagnosis not present

## 2019-06-09 DIAGNOSIS — Z992 Dependence on renal dialysis: Secondary | ICD-10-CM | POA: Diagnosis not present

## 2019-06-09 DIAGNOSIS — Z283 Underimmunization status: Secondary | ICD-10-CM | POA: Diagnosis not present

## 2019-06-09 DIAGNOSIS — N2581 Secondary hyperparathyroidism of renal origin: Secondary | ICD-10-CM | POA: Diagnosis not present

## 2019-06-09 DIAGNOSIS — D509 Iron deficiency anemia, unspecified: Secondary | ICD-10-CM | POA: Diagnosis not present

## 2019-06-09 DIAGNOSIS — N186 End stage renal disease: Secondary | ICD-10-CM | POA: Diagnosis not present

## 2019-06-09 DIAGNOSIS — D631 Anemia in chronic kidney disease: Secondary | ICD-10-CM | POA: Diagnosis not present

## 2019-06-11 DIAGNOSIS — N186 End stage renal disease: Secondary | ICD-10-CM | POA: Diagnosis not present

## 2019-06-11 DIAGNOSIS — D631 Anemia in chronic kidney disease: Secondary | ICD-10-CM | POA: Diagnosis not present

## 2019-06-11 DIAGNOSIS — Z992 Dependence on renal dialysis: Secondary | ICD-10-CM | POA: Diagnosis not present

## 2019-06-11 DIAGNOSIS — Z283 Underimmunization status: Secondary | ICD-10-CM | POA: Diagnosis not present

## 2019-06-11 DIAGNOSIS — N2581 Secondary hyperparathyroidism of renal origin: Secondary | ICD-10-CM | POA: Diagnosis not present

## 2019-06-11 DIAGNOSIS — D509 Iron deficiency anemia, unspecified: Secondary | ICD-10-CM | POA: Diagnosis not present

## 2019-06-14 DIAGNOSIS — N2581 Secondary hyperparathyroidism of renal origin: Secondary | ICD-10-CM | POA: Diagnosis not present

## 2019-06-14 DIAGNOSIS — N186 End stage renal disease: Secondary | ICD-10-CM | POA: Diagnosis not present

## 2019-06-14 DIAGNOSIS — D631 Anemia in chronic kidney disease: Secondary | ICD-10-CM | POA: Diagnosis not present

## 2019-06-14 DIAGNOSIS — Z283 Underimmunization status: Secondary | ICD-10-CM | POA: Diagnosis not present

## 2019-06-14 DIAGNOSIS — D509 Iron deficiency anemia, unspecified: Secondary | ICD-10-CM | POA: Diagnosis not present

## 2019-06-14 DIAGNOSIS — Z992 Dependence on renal dialysis: Secondary | ICD-10-CM | POA: Diagnosis not present

## 2019-06-15 ENCOUNTER — Ambulatory Visit: Payer: Medicare Other | Admitting: Family Medicine

## 2019-06-16 ENCOUNTER — Encounter: Payer: Self-pay | Admitting: Family Medicine

## 2019-06-16 DIAGNOSIS — N2581 Secondary hyperparathyroidism of renal origin: Secondary | ICD-10-CM | POA: Diagnosis not present

## 2019-06-16 DIAGNOSIS — N186 End stage renal disease: Secondary | ICD-10-CM | POA: Diagnosis not present

## 2019-06-16 DIAGNOSIS — Z992 Dependence on renal dialysis: Secondary | ICD-10-CM | POA: Diagnosis not present

## 2019-06-16 DIAGNOSIS — D631 Anemia in chronic kidney disease: Secondary | ICD-10-CM | POA: Diagnosis not present

## 2019-06-16 DIAGNOSIS — D509 Iron deficiency anemia, unspecified: Secondary | ICD-10-CM | POA: Diagnosis not present

## 2019-06-16 DIAGNOSIS — Z283 Underimmunization status: Secondary | ICD-10-CM | POA: Diagnosis not present

## 2019-06-17 DIAGNOSIS — N2581 Secondary hyperparathyroidism of renal origin: Secondary | ICD-10-CM | POA: Diagnosis not present

## 2019-06-17 DIAGNOSIS — Z992 Dependence on renal dialysis: Secondary | ICD-10-CM | POA: Diagnosis not present

## 2019-06-17 DIAGNOSIS — E8770 Fluid overload, unspecified: Secondary | ICD-10-CM | POA: Diagnosis not present

## 2019-06-17 DIAGNOSIS — N186 End stage renal disease: Secondary | ICD-10-CM | POA: Diagnosis not present

## 2019-06-18 DIAGNOSIS — N2581 Secondary hyperparathyroidism of renal origin: Secondary | ICD-10-CM | POA: Diagnosis not present

## 2019-06-18 DIAGNOSIS — D509 Iron deficiency anemia, unspecified: Secondary | ICD-10-CM | POA: Diagnosis not present

## 2019-06-18 DIAGNOSIS — D631 Anemia in chronic kidney disease: Secondary | ICD-10-CM | POA: Diagnosis not present

## 2019-06-18 DIAGNOSIS — Z992 Dependence on renal dialysis: Secondary | ICD-10-CM | POA: Diagnosis not present

## 2019-06-18 DIAGNOSIS — Z283 Underimmunization status: Secondary | ICD-10-CM | POA: Diagnosis not present

## 2019-06-18 DIAGNOSIS — N186 End stage renal disease: Secondary | ICD-10-CM | POA: Diagnosis not present

## 2019-06-21 DIAGNOSIS — Z283 Underimmunization status: Secondary | ICD-10-CM | POA: Diagnosis not present

## 2019-06-21 DIAGNOSIS — Z992 Dependence on renal dialysis: Secondary | ICD-10-CM | POA: Diagnosis not present

## 2019-06-21 DIAGNOSIS — D631 Anemia in chronic kidney disease: Secondary | ICD-10-CM | POA: Diagnosis not present

## 2019-06-21 DIAGNOSIS — D509 Iron deficiency anemia, unspecified: Secondary | ICD-10-CM | POA: Diagnosis not present

## 2019-06-21 DIAGNOSIS — N2581 Secondary hyperparathyroidism of renal origin: Secondary | ICD-10-CM | POA: Diagnosis not present

## 2019-06-21 DIAGNOSIS — N186 End stage renal disease: Secondary | ICD-10-CM | POA: Diagnosis not present

## 2019-06-23 DIAGNOSIS — D509 Iron deficiency anemia, unspecified: Secondary | ICD-10-CM | POA: Diagnosis not present

## 2019-06-23 DIAGNOSIS — N2581 Secondary hyperparathyroidism of renal origin: Secondary | ICD-10-CM | POA: Diagnosis not present

## 2019-06-23 DIAGNOSIS — Z992 Dependence on renal dialysis: Secondary | ICD-10-CM | POA: Diagnosis not present

## 2019-06-23 DIAGNOSIS — N186 End stage renal disease: Secondary | ICD-10-CM | POA: Diagnosis not present

## 2019-06-23 DIAGNOSIS — D631 Anemia in chronic kidney disease: Secondary | ICD-10-CM | POA: Diagnosis not present

## 2019-06-23 DIAGNOSIS — Z283 Underimmunization status: Secondary | ICD-10-CM | POA: Diagnosis not present

## 2019-06-25 DIAGNOSIS — N186 End stage renal disease: Secondary | ICD-10-CM | POA: Diagnosis not present

## 2019-06-25 DIAGNOSIS — Z283 Underimmunization status: Secondary | ICD-10-CM | POA: Diagnosis not present

## 2019-06-25 DIAGNOSIS — Z992 Dependence on renal dialysis: Secondary | ICD-10-CM | POA: Diagnosis not present

## 2019-06-25 DIAGNOSIS — D509 Iron deficiency anemia, unspecified: Secondary | ICD-10-CM | POA: Diagnosis not present

## 2019-06-25 DIAGNOSIS — D631 Anemia in chronic kidney disease: Secondary | ICD-10-CM | POA: Diagnosis not present

## 2019-06-25 DIAGNOSIS — N2581 Secondary hyperparathyroidism of renal origin: Secondary | ICD-10-CM | POA: Diagnosis not present

## 2019-08-12 DIAGNOSIS — Z992 Dependence on renal dialysis: Secondary | ICD-10-CM | POA: Diagnosis not present

## 2019-08-12 DIAGNOSIS — I871 Compression of vein: Secondary | ICD-10-CM | POA: Diagnosis not present

## 2019-08-12 DIAGNOSIS — N186 End stage renal disease: Secondary | ICD-10-CM | POA: Diagnosis not present

## 2019-08-12 DIAGNOSIS — T82858A Stenosis of vascular prosthetic devices, implants and grafts, initial encounter: Secondary | ICD-10-CM | POA: Diagnosis not present

## 2019-09-05 ENCOUNTER — Other Ambulatory Visit: Payer: Self-pay

## 2019-09-05 ENCOUNTER — Encounter: Payer: Self-pay | Admitting: Family Medicine

## 2019-09-05 ENCOUNTER — Ambulatory Visit (INDEPENDENT_AMBULATORY_CARE_PROVIDER_SITE_OTHER): Payer: Medicare Other | Admitting: Family Medicine

## 2019-09-05 VITALS — BP 102/61 | HR 78 | Temp 97.9°F | Ht 69.0 in | Wt 192.8 lb

## 2019-09-05 DIAGNOSIS — R413 Other amnesia: Secondary | ICD-10-CM

## 2019-09-05 DIAGNOSIS — R29898 Other symptoms and signs involving the musculoskeletal system: Secondary | ICD-10-CM | POA: Diagnosis not present

## 2019-09-05 DIAGNOSIS — N186 End stage renal disease: Secondary | ICD-10-CM | POA: Diagnosis not present

## 2019-09-05 DIAGNOSIS — H539 Unspecified visual disturbance: Secondary | ICD-10-CM

## 2019-09-05 DIAGNOSIS — Z992 Dependence on renal dialysis: Secondary | ICD-10-CM

## 2019-09-05 NOTE — Progress Notes (Signed)
Subjective:  Patient ID: Matthew [name], male    DOB: [birth year]  Age: 69 y.o. MRN: [id number]  CC:  Chief Complaint  Patient presents with  . Medication Assistance    Patient is here to see about getting home health assistance. He would like for [facility] home health for his care. Patient stated he need assistance with medication, health at home    HPI Matthew [name] presents for   Chronic kidney disease, stage V on hemodialysis.  Followed by [facility], Dr. Madelon Lips. Ongoing care and labwork with Dr. Hollie Salk.  Hemodialysis 3 days/week. Previously treated by physical therapy, using rolling walker or cane when discussed in September 2020.  Treated by physical therapy at that time as well with deconditioning, muscular deconditioning.  Has been referred to psychiatry previously regarding concerns for memory, loss of memory in past thought to be due to his worsening renal function/uremia.  Requesting home health assistance for assist with medication.   Still using rolling walker.  Lives by self. Daughter and granddaughter come by every few weeks, call him often. Preparing home meals - no injuries/accidents. Has food provided at place he lives.  Some difficulty with remembering meds - has a list. Some vision difficulty, for past year.  eye doctor next month. Not using pill minder/daily tray. Bathing - by himself - going ok. No falls.. has grab bars and shower chair. Needs some help cleaning up and with eyesite at home  Memory difficulty at times. Intermittent, no changes from last year.   Has received covid vaccine.   Transport here today by [employer] bus.   History Patient Active Problem List   Diagnosis Date Noted  . Acute metabolic encephalopathy 73/22/0254  . Suspected COVID-19 virus infection 05/04/2018  . Fall 05/03/2018  . Syncope 05/03/2018  . Acute uremia 04/27/2018  . AKI (acute kidney injury) ([place]) 04/27/2018  . Acute on chronic renal failure ([place]) 04/26/2018   . Alcohol abuse 04/26/2018  . Normocytic anemia 04/26/2018  . Metabolic acidosis 27/06/2374  . Acute lower UTI 04/26/2018  . Chronic renal failure, stage 4 (severe) ([place]) 08/01/2017  . Anemia, chronic renal failure, stage 4 (severe) ([place]) 08/01/2017  . DOE (dyspnea on exertion) 07/31/2017   Past Medical History:  Diagnosis Date  . Arthritis   . Diabetes ([place])    pt stated that he is no longer being treated for DM  . ESRD (end stage renal disease) ([place]) 04/26/2018  . Essential hypertension    Pt statewd that he now has low BP   Past Surgical History:  Procedure Laterality Date  . AV FISTULA PLACEMENT Left 10/18/2018   Procedure: First Stage Basilic Transposition.;  Surgeon: Angelia Mould, MD;  Location: [place];  Service: Vascular;  Laterality: Left;  . BASCILIC VEIN TRANSPOSITION Left 04/04/2019   Procedure: BASCILIC VEIN TRANSPOSITION;  Surgeon: Angelia Mould, MD;  Location: [facility] OR;  Service: Vascular;  Laterality: Left;  . finger lanced    . INSERTION OF DIALYSIS CATHETER Right 10/18/2018   Procedure: INSERTION OF Right Internal Jugular DIALYSIS CATHETER;  Surgeon: Angelia Mould, MD;  Location: [facility] OR;  Service: Vascular;  Laterality: Right;   No Known Allergies Prior to Admission medications   Medication Sig Start Date End Date Taking? Authorizing Provider  aspirin 325 MG tablet Take 325 mg by mouth as needed for mild pain (thin blood).   Yes [provider]  Methoxy PEG-Epoetin Beta (MIRCERA IJ) Mircera 02/15/19 02/14/20 Yes [provider]  Multiple  Vitamin (MULTIVITAMIN WITH MINERALS) TABS tablet Take 1 tablet by mouth daily. 05/07/18  Yes Sheikh, Omair Latif, DO  RENVELA 800 MG tablet Take 3,200 mg by mouth 3 (three) times daily. 03/29/19  Yes [provider]  VELPHORO 500 MG chewable tablet Take 2 tablet by mouth three times a day with meals and 1 BID prn with snacks 10/26/18  Yes [provider]   Social History    Socioeconomic History  . Marital status: Single    Spouse name: Not on file  . Number of children: Not on file  . Years of education: Not on file  . Highest education level: Not on file  Occupational History  . Occupation: dietician  Tobacco Use  . Smoking status: Former Smoker    Packs/day: 0.25    Years: 1.00    Pack years: 0.25    Types: Cigarettes    Quit date: 01/27/1997    Years since quitting: 22.6  . Smokeless tobacco: Never Used  Vaping Use  . Vaping Use: Never used  Substance and Sexual Activity  . Alcohol use: Yes    Alcohol/week: 10.0 - 12.0 standard drinks    Types: 10 - 12 Cans of beer per week    Comment: occasional  . Drug use: Not Currently    Types: Marijuana  . Sexual activity: Yes  Other Topics Concern  . Not on file  Social History Narrative  . Not on file   Social Determinants of Health   Financial Resource Strain:   . Difficulty of Paying Living Expenses:   Food Insecurity:   . Worried About Charity fundraiser in the Last Year:   . Arboriculturist in the Last Year:   Transportation Needs:   . Film/video editor (Medical):   Marland Kitchen Lack of Transportation (Non-Medical):   Physical Activity:   . Days of Exercise per Week:   . Minutes of Exercise per Session:   Stress:   . Feeling of Stress :   Social Connections:   . Frequency of Communication with Friends and Family:   . Frequency of Social Gatherings with Friends and Family:   . Attends Religious Services:   . Active Member of Clubs or Organizations:   . Attends Archivist Meetings:   Marland Kitchen Marital Status:   Intimate Partner Violence:   . Fear of Current or Ex-Partner:   . Emotionally Abused:   Marland Kitchen Physically Abused:   . Sexually Abused:     Review of Systems  Per HPI.  Objective:   Vitals:   09/05/19 1353  BP: 102/61  Pulse: 78  Temp: 97.9 F (36.6 C)  TempSrc: Temporal  SpO2: 97%  Weight: 192 lb 12.8 oz (87.5 kg)  Height: 5\' 9"  (1.753 m)     Physical  Exam Vitals reviewed.  Constitutional:      Appearance: He is well-developed.  HENT:     Head: Normocephalic and atraumatic.  Eyes:     Pupils: Pupils are equal, round, and reactive to light.  Neck:     Vascular: No carotid bruit or JVD.  Cardiovascular:     Rate and Rhythm: Normal rate and regular rhythm.     Heart sounds: Normal heart sounds. No murmur heard.   Pulmonary:     Effort: Pulmonary effort is normal.     Breath sounds: Normal breath sounds. No rales.  Musculoskeletal:     Comments: Ambulates with walker, slow gait but narrow, able to rise from  seated position to standing without assistance but slowly ambulates.  Skin:    General: Skin is warm and dry.  Neurological:     Mental Status: He is alert and oriented to person, place, and time.  Psychiatric:        Mood and Affect: Mood normal.        Behavior: Behavior normal.     Comments: Calm demeanor, appropriate responses to questioning, understanding expressed of plan.     Mini-Cog - 09/05/19 1404    Normal clock drawing test? yes    How many words correct? 2           92minutes spent during visit, greater than 50% counseling and assimilation of information, chart review, and discussion of plan.    Assessment & Plan:  Matthew [name] is a 69 y.o. male . Stage 5 chronic kidney disease on chronic dialysis ([place]) - Plan: Ambulatory referral to [place]: Ambulatory referral to [place]  Difficulty reading due to visual problem - Plan: Ambulatory referral to [place]  Memory difficulties - Plan: Ambulatory referral to [place]  CKD, with some visual difficulties, deconditioning, memory difficulty, now with some increased needs at home.   -Refer to home health for PT eval and needs eval. Continue follow up with nephrology as planned.   -pill minder recommended.   No orders of the defined types were placed in this encounter.  Patient Instructions    I do  recommend daily pill minder to help with medications and remembering which ones to take. Your family or home health can help arrange those pill boxes.   I will place referral to home health for evaluation of strength and for home assistance.   If you have lab work done today you will be contacted with your lab results within the next 2 weeks.  If you have not heard from [place] then please contact us. The fastest way to get your results is to register for My Chart.   IF you received an x-ray today, you will receive an invoice from [facility]. Please contact [facility] at [phone] with questions or concerns regarding your invoice.   IF you received labwork today, you will receive an invoice from [place]. Please contact [facility] at [phone] with questions or concerns regarding your invoice.   Our billing staff will not be able to assist you with questions regarding bills from these companies.  You will be contacted with the lab results as soon as they are available. The fastest way to get your results is to activate your My Chart account. Instructions are located on the last page of this paperwork. If you have not heard from [place] regarding the results in 2 weeks, please contact this office.          Signed, Merri Ray, MD [facility]

## 2019-09-05 NOTE — Progress Notes (Signed)
Patient go to Dialysis 3 times a week. Patient uses a catheter for urination. Patient states has some memory loss which is also documented in his records

## 2019-09-05 NOTE — Patient Instructions (Addendum)
  I do recommend daily pill minder to help with medications and remembering which ones to take. Your family or home health can help arrange those pill boxes.   I will place referral to home health for evaluation of strength and for home assistance.   If you have lab work done today you will be contacted with your lab results within the next 2 weeks.  If you have not heard from [place] then please contact us. The fastest way to get your results is to register for My Chart.   IF you received an x-ray today, you will receive an invoice from [facility]. Please contact [facility] at [phone] with questions or concerns regarding your invoice.   IF you received labwork today, you will receive an invoice from [place]. Please contact [facility] at [phone] with questions or concerns regarding your invoice.   Our billing staff will not be able to assist you with questions regarding bills from these companies.  You will be contacted with the lab results as soon as they are available. The fastest way to get your results is to activate your My Chart account. Instructions are located on the last page of this paperwork. If you have not heard from [place] regarding the results in 2 weeks, please contact this office.

## 2019-09-08 ENCOUNTER — Telehealth: Payer: Self-pay | Admitting: Family Medicine

## 2019-09-08 NOTE — Telephone Encounter (Signed)
Matthew [name] call back number [phone]

## 2019-09-08 NOTE — Telephone Encounter (Signed)
Flint Melter from [facility] is calling regarding the from that were faxed off on 08/14/19. Paperwork is in providers box beside nurses station. They are wanting to know the status of this from. Please advise.

## 2019-09-09 NOTE — Telephone Encounter (Signed)
Per Benard Rink faxed back 09/08/2019 he called and spoke with Otila Kluver as well

## 2020-03-07 ENCOUNTER — Ambulatory Visit: Payer: Medicare Other | Admitting: Family Medicine

## 2020-03-28 ENCOUNTER — Ambulatory Visit: Payer: Medicare Other | Admitting: Family Medicine

## 2020-04-13 ENCOUNTER — Encounter: Payer: Self-pay | Admitting: Family Medicine

## 2020-04-13 ENCOUNTER — Telehealth (INDEPENDENT_AMBULATORY_CARE_PROVIDER_SITE_OTHER): Payer: Medicare Other | Admitting: Family Medicine

## 2020-04-13 VITALS — Ht 69.0 in | Wt 189.0 lb

## 2020-04-13 DIAGNOSIS — Z992 Dependence on renal dialysis: Secondary | ICD-10-CM

## 2020-04-13 DIAGNOSIS — N186 End stage renal disease: Secondary | ICD-10-CM | POA: Diagnosis not present

## 2020-04-13 NOTE — Progress Notes (Signed)
Virtual Visit via Telephone Note  I connected with Matthew [name] on 04/13/20 at 3:08 PM by telephone and verified that I am speaking with the correct person using two identifiers. Patient location: at daughter's house My location: home   I discussed the limitations, risks, security and privacy concerns of performing an evaluation and management service by telephone and the availability of in person appointments. I also discussed with the patient that there may be a patient responsible charge related to this service. The patient expressed understanding and agreed to proceed, consent obtained  Chief complaint: Chief Complaint  Patient presents with   Follow-up    On Stage 5 chronic kidney disease.  Pt reports he recently went and got a port put in his L arm for his dialysis. Pt reports no change to his condition since last OV.    History of Present Illness:  Chronic kidney disease, stage V on hemodialysis Followed by nephrology, Dr. Hollie Salk.  Ongoing lab work and monitoring by nephrology. New access since last week?  Dialysis 3 days/week - tues, thurs, sat.  Feeling ok.  Previous muscular diffuse conditioning, treated by physical therapy.  Rolling walker with family support every few weeks when discussed in August 2021. Some memory difficulty at times, discussed pill minders.  Home health referral for eval strength and home assistance was ordered last August. Lives by self - some independent meal prep, some with daughter and home assistant/aid 4 days per week.  Has grab bars in bathroom- usually independent on bathing - rare assistance needed. Toilet independent.  Using pillminder. Denies any home needs at this time. Feel safe at home.   Fall Risk  04/13/2020 09/05/2019 12/15/2018 11/03/2018 10/11/2018  Falls in the past year? 1 0 0 0 0  Number falls in past yr: 0 0 0 0 0  Injury with Fall? 0 0 - 0 0  Risk for fall due to : - - Impaired mobility;Impaired balance/gait - -  Follow up  Falls evaluation completed - - Falls evaluation completed Falls evaluation completed   Depression screen [facility] 2/9 04/13/2020 09/05/2019 12/15/2018 11/03/2018 10/11/2018  Decreased Interest 0 0 0 0 1  Down, Depressed, Hopeless 0 0 0 0 1  PHQ - 2 Score 0 0 0 0 2  Altered sleeping - - - - 1  Tired, decreased energy - - - - 1  Change in appetite - - - - 1  Feeling bad or failure about yourself  - - - - 1  Trouble concentrating - - - - 0  Moving slowly or fidgety/restless - - - - 0  Suicidal thoughts - - - - 0  PHQ-9 Score - - - - 6  Difficult doing work/chores - - - - -      He has also been followed by ophthalmology, Dr. Gershon Crane for cataract, then a dislocated intraocular lens in September.  HM: Discussed colon cancer screening - declines at this time.  Had covid and flu vaccines.    Patient Active Problem List   Diagnosis Date Noted   Acute metabolic encephalopathy [id number]   Suspected COVID-19 virus infection 05/04/2018   Fall 05/03/2018   Syncope 05/03/2018   Acute uremia 04/27/2018   AKI (acute kidney injury) ([place]) 04/27/2018   Acute on chronic renal failure ([place]) 04/26/2018   Alcohol abuse 04/26/2018   Normocytic anemia [id number]   Metabolic acidosis Q000111Q   Acute lower UTI 04/26/2018   Chronic renal failure, stage 4 (severe) ([place]) 08/01/2017   Anemia,  chronic renal failure, stage 4 (severe) ([place]) 08/01/2017   DOE (dyspnea on exertion) 07/31/2017   Past Medical History:  Diagnosis Date   Arthritis    Diabetes ([place])    pt stated that he is no longer being treated for DM   ESRD (end stage renal disease) ([place]) 04/26/2018   Essential hypertension    Pt statewd that he now has low BP   Past Surgical History:  Procedure Laterality Date   AV FISTULA PLACEMENT Left 10/18/2018   Procedure: First Stage Basilic Transposition.;  Surgeon: Angelia Mould, MD;  Location: [place];  Service: Vascular;  Laterality: Left;   [place]  Left 04/04/2019   Procedure: BASCILIC VEIN TRANSPOSITION;  Surgeon: Angelia Mould, MD;  Location: [place];  Service: Vascular;  Laterality: Left;   finger lanced     INSERTION OF DIALYSIS CATHETER Right 10/18/2018   Procedure: INSERTION OF Right Internal Jugular DIALYSIS CATHETER;  Surgeon: Angelia Mould, MD;  Location: [facility] OR;  Service: Vascular;  Laterality: Right;   No Known Allergies Prior to Admission medications   Medication Sig Start Date End Date Taking? Authorizing Provider  aspirin 325 MG tablet Take 325 mg by mouth as needed for mild pain (thin blood).   Yes [provider]  Multiple Vitamin (MULTIVITAMIN WITH MINERALS) TABS tablet Take 1 tablet by mouth daily. 05/07/18  Yes Sheikh, Omair Latif, DO  RENVELA 800 MG tablet Take 3,200 mg by mouth 3 (three) times daily. 03/29/19  Yes [provider]  VELPHORO 500 MG chewable tablet Take 2 tablet by mouth three times a day with meals and 1 BID prn with snacks 10/26/18  Yes [provider]   Social History   Socioeconomic History   Marital status: Single    Spouse name: Not on file   Number of children: Not on file   Years of education: Not on file   Highest education level: Not on file  Occupational History   Occupation: dietician  Tobacco Use   Smoking status: Former Smoker    Packs/day: 0.25    Years: 1.00    Pack years: 0.25    Types: Cigarettes    Quit date: 01/27/1997    Years since quitting: 23.2   Smokeless tobacco: Never Used  Vaping Use   Vaping Use: Never used  Substance and Sexual Activity   Alcohol use: Yes    Alcohol/week: 10.0 - 12.0 standard drinks    Types: 10 - 12 Cans of beer per week    Comment: occasional   Drug use: Not Currently    Types: Marijuana   Sexual activity: Yes  Other Topics Concern   Not on file  Social History Narrative   Not on file   Social Determinants of Health   Financial Resource Strain: Not on file  Food Insecurity: Not on  file  Transportation Needs: Not on file  Physical Activity: Not on file  Stress: Not on file  Social Connections: Not on file  Intimate Partner Violence: Not on file     Observations/Objective: Vitals:   04/13/20 1057  Weight: 189 lb (85.7 kg)  Height: '5\' 9"'$  (1.753 m)  No distress on phone, appropriate responses, speaking in full sentences without respiratory distress.  Coherent responses.  All questions were answered and understanding of plan expressed.   Assessment and Plan: Stage 5 chronic kidney disease on chronic dialysis ([place]) Stable with ongoing follow-up by nephrology.  Has assistance at home, pill minder for medications,  family involved.  Denies acute needs at this time.  Fall precautions discussed but denies falls.  1 year follow-up discussed as he has ongoing care with nephrology, but advised to let me know if any changes or questions prior to that time.  Follow Up Instructions: 1 year.   I discussed the assessment and treatment plan with the patient. The patient was provided an opportunity to ask questions and all were answered. The patient agreed with the plan and demonstrated an understanding of the instructions.   The patient was advised to call back or seek an in-person evaluation if the symptoms worsen or if the condition fails to improve as anticipated.  I provided 11 minutes of non-face-to-face time during this encounter.  Signed,   Merri Ray, MD [facility].  04/13/20

## 2020-04-13 NOTE — Patient Instructions (Signed)
° ° ° °  If you have lab work done today you will be contacted with your lab results within the next 2 weeks.  If you have not heard from us then please contact us. The fastest way to get your results is to register for My Chart. ° ° °IF you received an x-ray today, you will receive an invoice from [facility]. Please contact [facility] at [phone] with questions or concerns regarding your invoice.  ° °IF you received labwork today, you will receive an invoice from [facility]. Please contact [facility] at [phone] with questions or concerns regarding your invoice.  ° °Our billing staff will not be able to assist you with questions regarding bills from these companies. ° °You will be contacted with the lab results as soon as they are available. The fastest way to get your results is to activate your My Chart account. Instructions are located on the last page of this paperwork. If you have not heard from us regarding the results in 2 weeks, please contact this office. °  ° ° ° °

## 2021-01-17 ENCOUNTER — Other Ambulatory Visit: Payer: Self-pay

## 2021-01-17 ENCOUNTER — Emergency Department (HOSPITAL_COMMUNITY)
Admission: EM | Admit: 2021-01-17 | Discharge: 2021-01-18 | Disposition: A | Discharge status: Home / Self Care | Payer: Medicare Other | Attending: Emergency Medicine | Admitting: Emergency Medicine

## 2021-01-17 DIAGNOSIS — T82838A Hemorrhage of vascular prosthetic devices, implants and grafts, initial encounter: Secondary | ICD-10-CM | POA: Diagnosis not present

## 2021-01-17 DIAGNOSIS — N186 End stage renal disease: Secondary | ICD-10-CM

## 2021-01-17 DIAGNOSIS — Z87891 Personal history of nicotine dependence: Secondary | ICD-10-CM | POA: Diagnosis not present

## 2021-01-17 DIAGNOSIS — Z7982 Long term (current) use of aspirin: Secondary | ICD-10-CM | POA: Diagnosis not present

## 2021-01-17 DIAGNOSIS — D631 Anemia in chronic kidney disease: Secondary | ICD-10-CM | POA: Diagnosis not present

## 2021-01-17 DIAGNOSIS — Z992 Dependence on renal dialysis: Secondary | ICD-10-CM | POA: Insufficient documentation

## 2021-01-17 DIAGNOSIS — M2518 Fistula, other specified site: Secondary | ICD-10-CM | POA: Insufficient documentation

## 2021-01-17 DIAGNOSIS — N189 Chronic kidney disease, unspecified: Secondary | ICD-10-CM

## 2021-01-17 DIAGNOSIS — I120 Hypertensive chronic kidney disease with stage 5 chronic kidney disease or end stage renal disease: Secondary | ICD-10-CM | POA: Insufficient documentation

## 2021-01-17 DIAGNOSIS — E1122 Type 2 diabetes mellitus with diabetic chronic kidney disease: Secondary | ICD-10-CM | POA: Diagnosis not present

## 2021-01-17 LAB — CBC WITH DIFFERENTIAL/PLATELET
Abs Immature Granulocytes: 0.02 10*3/uL (ref 0.00–0.07)
Basophils Absolute: 0 10*3/uL (ref 0.0–0.1)
Basophils Relative: 1 %
Eosinophils Absolute: 0.1 10*3/uL (ref 0.0–0.5)
Eosinophils Relative: 3 %
HCT: 31.3 % — ABNORMAL LOW (ref 39.0–52.0)
Hemoglobin: 9.9 g/dL — ABNORMAL LOW (ref 13.0–17.0)
Immature Granulocytes: 0 %
Lymphocytes Relative: 22 %
Lymphs Abs: 1.1 10*3/uL (ref 0.7–4.0)
MCH: 28.1 pg (ref 26.0–34.0)
MCHC: 31.6 g/dL (ref 30.0–36.0)
MCV: 88.9 fL (ref 80.0–100.0)
Monocytes Absolute: 0.4 10*3/uL (ref 0.1–1.0)
Monocytes Relative: 9 %
Neutro Abs: 3.3 10*3/uL (ref 1.7–7.7)
Neutrophils Relative %: 65 %
Platelets: 218 10*3/uL (ref 150–400)
RBC: 3.52 MIL/uL — ABNORMAL LOW (ref 4.22–5.81)
RDW: 17.4 % — ABNORMAL HIGH (ref 11.5–15.5)
WBC: 5.1 10*3/uL (ref 4.0–10.5)
nRBC: 0 % (ref 0.0–0.2)

## 2021-01-17 LAB — COMPREHENSIVE METABOLIC PANEL
ALT: 9 U/L (ref 0–44)
AST: 13 U/L — ABNORMAL LOW (ref 15–41)
Albumin: 3.4 g/dL — ABNORMAL LOW (ref 3.5–5.0)
Alkaline Phosphatase: 43 U/L (ref 38–126)
Anion gap: 9 (ref 5–15)
BUN: 19 mg/dL (ref 8–23)
CO2: 31 mmol/L (ref 22–32)
Calcium: 7.9 mg/dL — ABNORMAL LOW (ref 8.9–10.3)
Chloride: 97 mmol/L — ABNORMAL LOW (ref 98–111)
Creatinine, Ser: 5.74 mg/dL — ABNORMAL HIGH (ref 0.61–1.24)
GFR, Estimated: 10 mL/min — ABNORMAL LOW (ref >60)
Glucose, Bld: 112 mg/dL — ABNORMAL HIGH (ref 70–99)
Potassium: 3.2 mmol/L — ABNORMAL LOW (ref 3.5–5.1)
Sodium: 137 mmol/L (ref 135–145)
Total Bilirubin: 0.5 mg/dL (ref 0.3–1.2)
Total Protein: 7.5 g/dL (ref 6.5–8.1)

## 2021-01-17 LAB — TYPE AND SCREEN
ABO/RH(D): A POS
Antibody Screen: NEGATIVE

## 2021-01-17 LAB — PROTIME-INR
INR: 1 (ref 0.8–1.2)
Prothrombin Time: 13.1 seconds (ref 11.4–15.2)

## 2021-01-17 NOTE — ED Notes (Signed)
Bleeding controlled. Pt reports pressure bandage has been in place for ~2hrs.

## 2021-01-17 NOTE — ED Provider Notes (Signed)
[facility] EMERGENCY DEPARTMENT Provider Note   CSN: [id number] Arrival date & time: 01/17/21  2223     History Chief Complaint  Patient presents with   fistula    Bleeding from dialysis fistula    Matthew [name] is a 70 y.o. male.  The history is provided by the patient.  He has history of hypertension, diabetes, end-stage renal disease on hemodialysis and comes in because of bleeding from his dialysis fistula.  He had his usual dialysis session today, but he had bleeding from his fistula puncture site when he got home.  Bleeding was not able to be controlled at home.  EMS was called and applied a dressing and brought him to the hospital.  He denies any weakness, dizziness, syncope.  He does take aspirin, but is on no other antiplatelet agents and no anticoagulants.   Past Medical History:  Diagnosis Date   Arthritis    Diabetes ([place])    pt stated that he is no longer being treated for DM   ESRD (end stage renal disease) ([place]) 04/26/2018   Essential hypertension    Pt statewd that he now has low BP    Patient Active Problem List   Diagnosis Date Noted   Acute metabolic encephalopathy 30/09/2328   Suspected COVID-19 virus infection 05/04/2018   Fall 05/03/2018   Syncope 05/03/2018   Acute uremia 04/27/2018   AKI (acute kidney injury) ([place]) 04/27/2018   Acute on chronic renal failure ([place]) 04/26/2018   Alcohol abuse 04/26/2018   Normocytic anemia 07/62/2633   Metabolic acidosis 35/45/6256   Acute lower UTI 04/26/2018   Chronic renal failure, stage 4 (severe) ([place]) 08/01/2017   Anemia, chronic renal failure, stage 4 (severe) ([place]) 08/01/2017   DOE (dyspnea on exertion) 07/31/2017    Past Surgical History:  Procedure Laterality Date   AV FISTULA PLACEMENT Left 10/18/2018   Procedure: First Stage Basilic Transposition.;  Surgeon: Angelia Mould, MD;  Location: [place];  Service: Vascular;  Laterality: Left;   [place] Left  04/04/2019   Procedure: BASCILIC VEIN TRANSPOSITION;  Surgeon: Angelia Mould, MD;  Location: [facility] OR;  Service: Vascular;  Laterality: Left;   finger lanced     INSERTION OF DIALYSIS CATHETER Right 10/18/2018   Procedure: INSERTION OF Right Internal Jugular DIALYSIS CATHETER;  Surgeon: Angelia Mould, MD;  Location: [facility] OR;  Service: Vascular;  Laterality: Right;       Family History  Problem Relation Age of Onset   Hypertension Mother    Migraines Mother    Heart disease Mother     Social History   Tobacco Use   Smoking status: Former    Packs/day: 0.25    Years: 1.00    Pack years: 0.25    Types: Cigarettes    Quit date: 01/27/1997    Years since quitting: 23.9   Smokeless tobacco: Never  Vaping Use   Vaping Use: Never used  Substance Use Topics   Alcohol use: Yes    Alcohol/week: 10.0 - 12.0 standard drinks    Types: 10 - 12 Cans of beer per week    Comment: occasional   Drug use: Not Currently    Types: Marijuana    Home Medications Prior to Admission medications   Medication Sig Start Date End Date Taking? Authorizing Provider  aspirin 325 MG tablet Take 325 mg by mouth as needed for mild pain (thin blood).    [provider]  Multiple Vitamin (MULTIVITAMIN  WITH MINERALS) TABS tablet Take 1 tablet by mouth daily. 05/07/18   Sheikh, Omair Latif, DO  RENVELA 800 MG tablet Take 3,200 mg by mouth 3 (three) times daily. 03/29/19   [provider]  VELPHORO 500 MG chewable tablet Take 2 tablet by mouth three times a day with meals and 1 BID prn with snacks 10/26/18   [provider]    Allergies    Patient has no known allergies.  Review of Systems   Review of Systems  All other systems reviewed and are negative.  Physical Exam Updated Vital Signs BP 124/90    Pulse (!) 40    Temp 98.4 F (36.9 C) (Oral)    Resp 18    Ht 5\' 10"  (1.778 m)    Wt 84.4 kg    SpO2 97%    BMI 26.69 kg/m   Physical Exam Vitals and nursing note  reviewed.  70 year old male, resting comfortably and in no acute distress. Vital signs are normal. Oxygen saturation is 94%, which is normal. Head is normocephalic and atraumatic. PERRLA, EOMI. Oropharynx is clear. Neck is nontender and supple without adenopathy or JVD. Back is nontender and there is no CVA tenderness. Lungs are clear without rales, wheezes, or rhonchi. Chest is nontender. Heart has regular rate and rhythm without murmur. Abdomen is soft, flat, nontender without masses or hepatosplenomegaly and peristalsis is normoactive. Extremities: AV fistula present in the left upper arm.  A pressure dressing was in place which is removed, no active bleeding seen.  Recent puncture site identified and shows no evidence of active bleeding. Skin is warm and dry without rash. Neurologic: Mental status is normal, cranial nerves are intact, moves all extremities equally.  ED Results / Procedures / Treatments   Labs (all labs ordered are listed, but only abnormal results are displayed) Labs Reviewed  CBC WITH DIFFERENTIAL/PLATELET - Abnormal; Notable for the following components:      Result Value   RBC 3.52 (*)    Hemoglobin 9.9 (*)    HCT 31.3 (*)    RDW 17.4 (*)    All other components within normal limits  COMPREHENSIVE METABOLIC PANEL - Abnormal; Notable for the following components:   Potassium 3.2 (*)    Chloride 97 (*)    Glucose, Bld 112 (*)    Creatinine, Ser 5.74 (*)    Calcium 7.9 (*)    Albumin 3.4 (*)    AST 13 (*)    GFR, Estimated 10 (*)    All other components within normal limits  PROTIME-INR  TYPE AND SCREEN   Procedures Procedures   Medications Ordered in ED Medications - No data to display  ED Course  I have reviewed the triage vital signs and the nursing notes.  Pertinent lab results that were available during my care of the patient were reviewed by me and considered in my medical decision making (see chart for details).   MDM Rules/Calculators/A&P                          Bleeding from AV fistula following dialysis, now resolved.  Labs obtained at triage showed stable hemoglobin.  Mild hypokalemia present probably secondary to recent dialysis and will probably correct on its own.  Old records are reviewed, and he has no relevant past visits.  He will be observed with the dressing off to see if he has any spontaneous rebleeding.  Patient was observed for 1  hour with no recurrence of bleeding.  He is discharged with instructions to return if bleeding recurs and is unable to be controlled at home.  Final Clinical Impression(s) / ED Diagnoses Final diagnoses:  Bleeding from dialysis shunt, initial encounter [facility])  End-stage renal disease on hemodialysis ([place])  Anemia associated with chronic renal failure    Rx / DC Orders ED Discharge Orders     None        Delora Fuel, MD 93/73/42 [phone]

## 2021-01-17 NOTE — ED Provider Notes (Signed)
Emergency Medicine Provider Triage Evaluation Note  Matthew [name] , a 70 y.o. male  was evaluated in triage.  Pt complains of bleeding from his fistula.  Patient reports he went to dialysis earlier today.  No complications with dialysis until afterwards.  When they decannulated him he had some bleeding but they were able to get it stopped.  Patient reports he was sent home with the fistula wrapped. Just PTA he noticed significant bleeding through the dressing.  States he took the dressing off and blood was pouring out.  Per EMS, fire department reported squirting blood.  Pressure dressing placed by them prior to arrival.  Syncope, numbness or weakness.  Review of Systems  Positive: Bleeding fistula Negative: Fever, chills  Physical Exam  BP 128/65 (BP Location: Right Arm)    Pulse 79    Temp 98.4 F (36.9 C) (Oral)    Resp 18    SpO2 94%  Gen:   Awake, no distress   Resp:  Normal effort  MSK:   Moves extremities without difficulty  Other:  L upper extremity wrapped with pressure dressing; no active bleeding through the dressing; fistula not palpable through dressing  Medical Decision Making  Medically screening exam initiated at 10:31 PM.  Appropriate orders placed.  Felder Lebeda was informed that the remainder of the evaluation will be completed by another provider, this initial triage assessment does not replace that evaluation, and the importance of remaining in the ED until their evaluation is complete.  Bleeding fistula.    Aleya Durnell, Gwenlyn Perking 77/82/42 [id number]    Delora Fuel, MD 14/43/15 [phone]

## 2021-01-17 NOTE — ED Triage Notes (Signed)
Pt reports bleeding from dialysis fistula. Pt had dialysis earlier today and states the bleeding will not stop.

## 2021-01-18 NOTE — ED Notes (Signed)
Pt verbalized understanding of d/c instructions, meds, and followup care. Denies questions. VSS, no distress noted. Ace wrap dressing applied. Steady gait to exit with all belongings. Family called for transport

## 2021-01-18 NOTE — Discharge Instructions (Signed)
Leave the dressing in place until the morning.  Return if you have any further bleeding which is unable to be controlled at home.

## 2021-01-21 IMAGING — CT CT RENAL STONE PROTOCOL
2 of 4 series · 17 of 46 positions shown, 19 images · non-contrast
Comparison: Ultrasound 05/04/2018

CLINICAL DATA: Previous history of hydronephrosis.

EXAM:
CT ABDOMEN AND PELVIS WITHOUT CONTRAST
TECHNIQUE: Multidetector CT imaging of the abdomen and pelvis was performed
following the standard protocol without IV contrast.

[Series 3: ap without · axial · non-contrast · 0.76mm/px · z∈[-589,-224]mm · 14 of 83 slices shown, 16 images]
[im 5/83  soft-tissue]
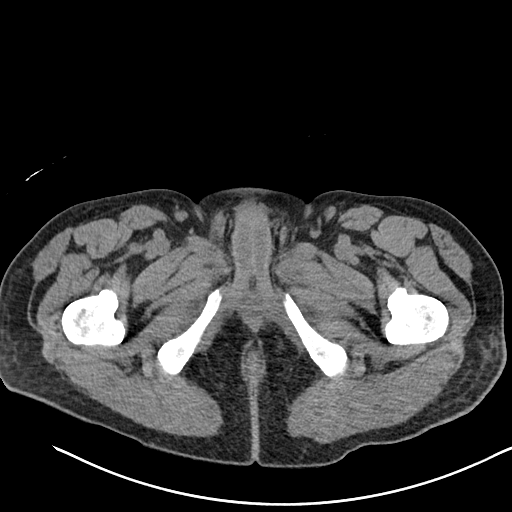
[im 5/83  bone]
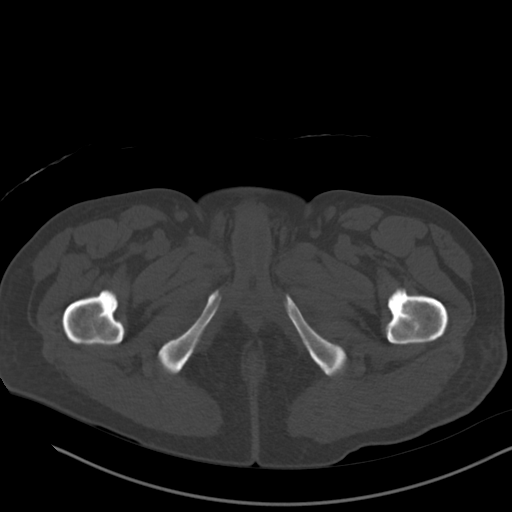
[im 13/83  soft-tissue]
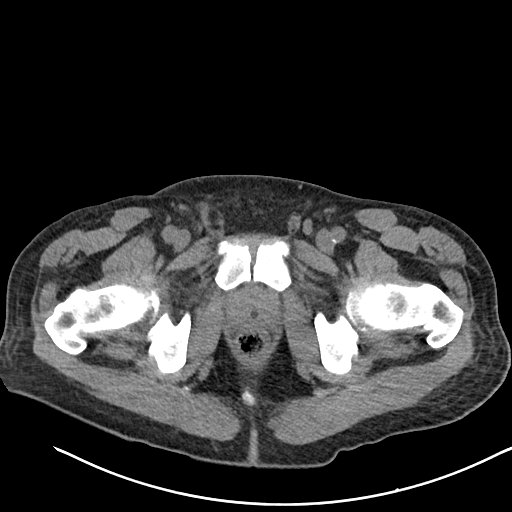
[im 17/83  soft-tissue]
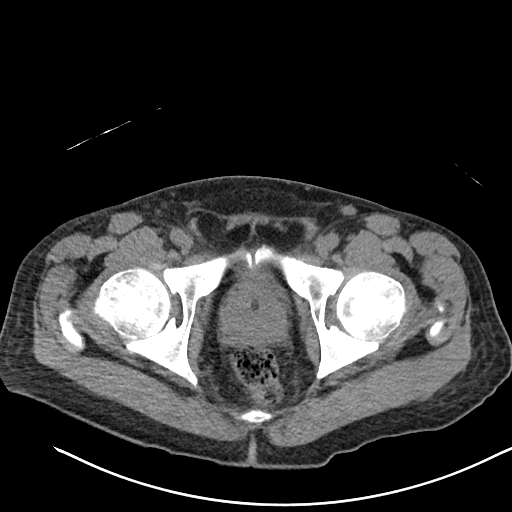
[im 21/83  soft-tissue]
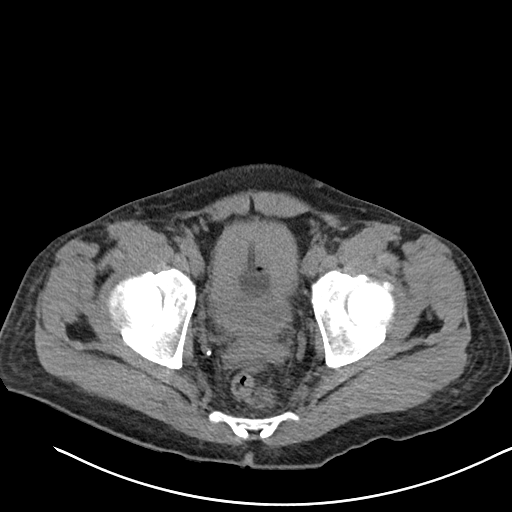
[im 29/83  soft-tissue]
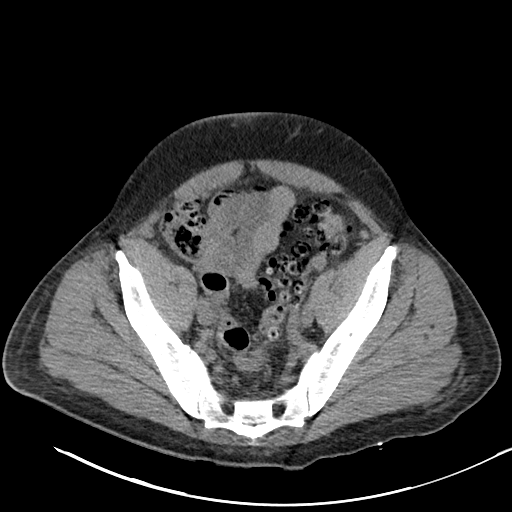
[im 33/83  soft-tissue]
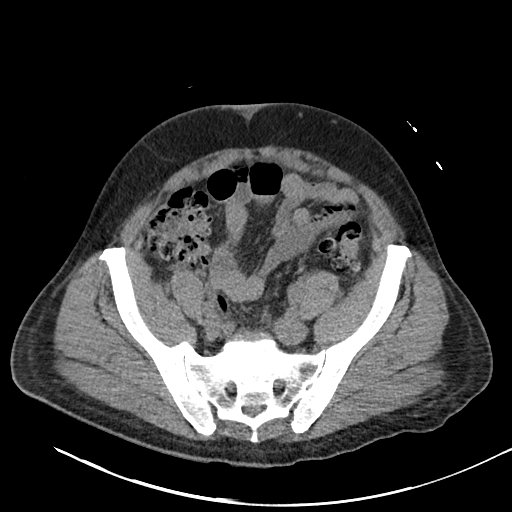
[im 37/83  soft-tissue]
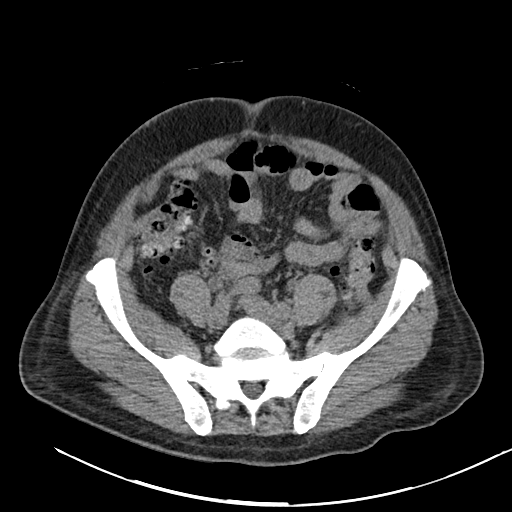
[im 46/83  soft-tissue]
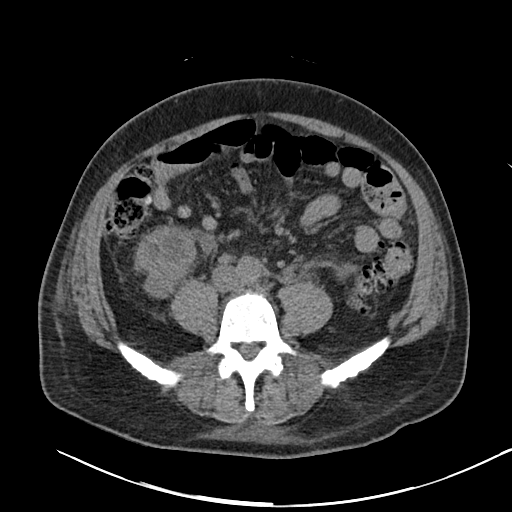
[im 50/83  soft-tissue]
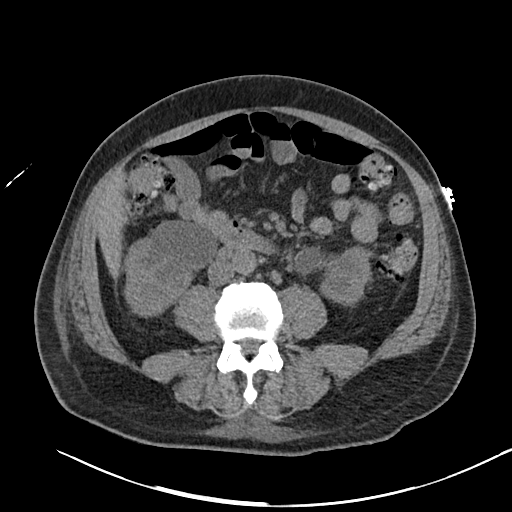
[im 50/83  bone]
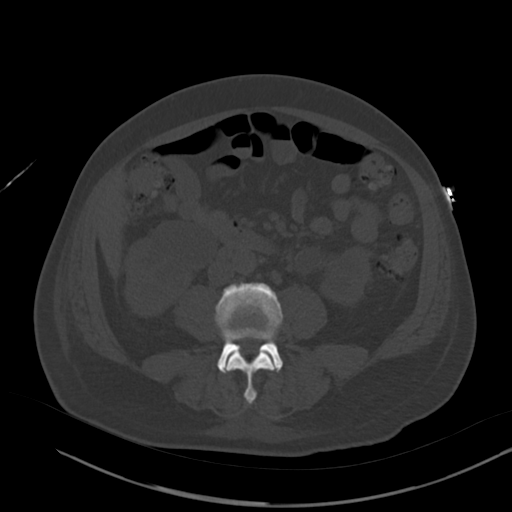
[im 54/83  soft-tissue]
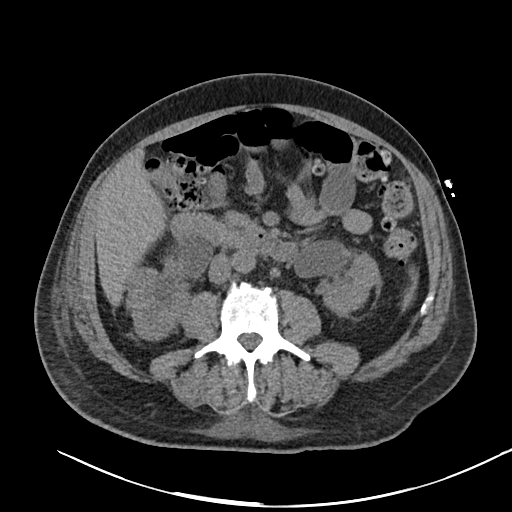
[im 62/83  soft-tissue]
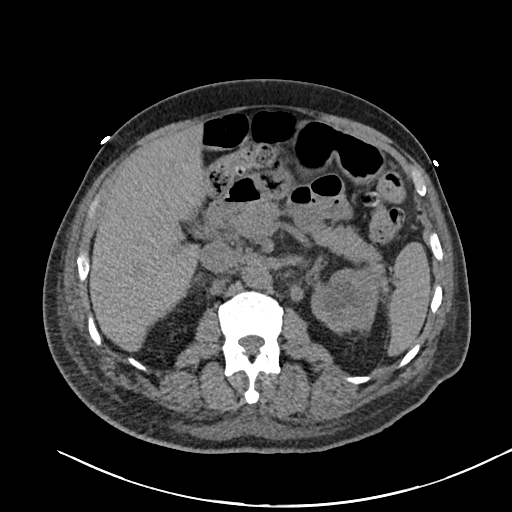
[im 66/83  soft-tissue]
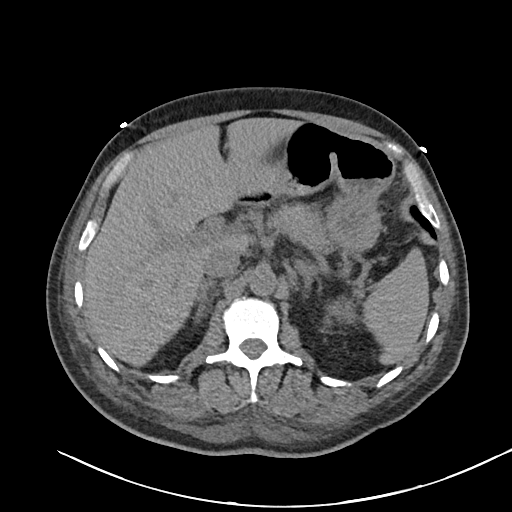
[im 70/83  soft-tissue]
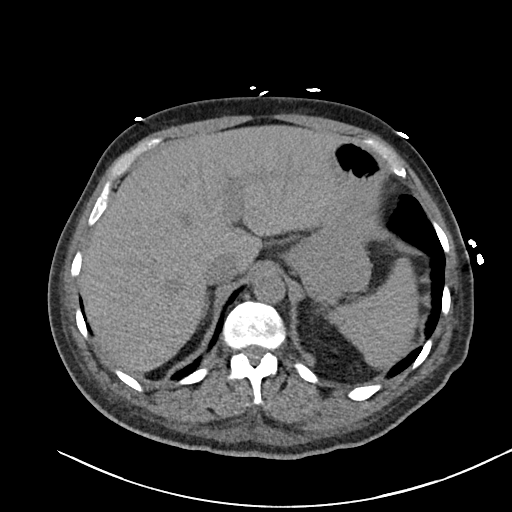
[im 78/83  soft-tissue]
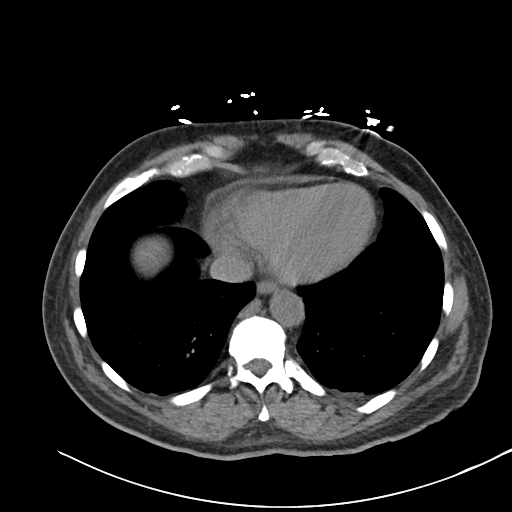

[Series 6: cor · coronal · 0.78mm/px · 3 of 103 slices shown]
[im 35/103  soft-tissue]
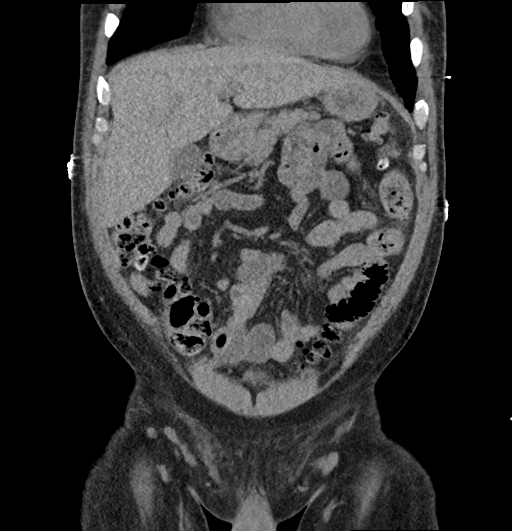
[im 46/103  soft-tissue]
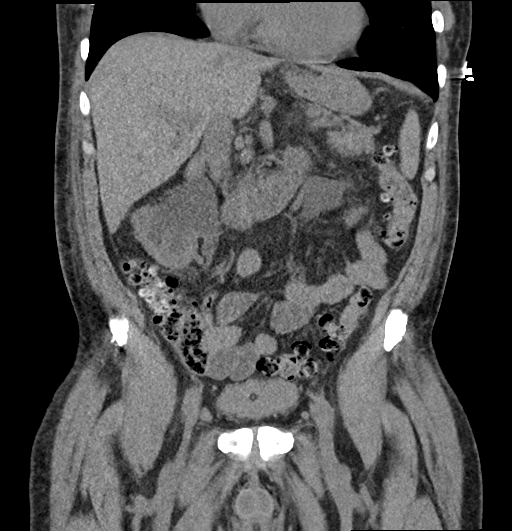
[im 57/103  soft-tissue]
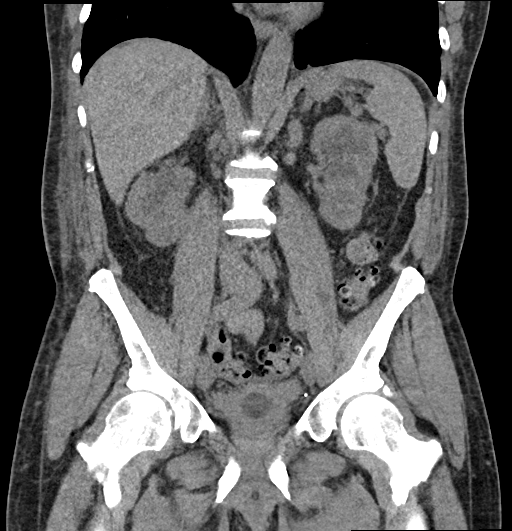

[17 of 46 positions shown; findings below may reference images not displayed]

FINDINGS: Lower chest: Mild patchy scarring or atelectasis at both lung bases.
No pleural effusion.

Hepatobiliary: Normal without contrast.

Pancreas: Normal

Spleen: Normal

Adrenals/Urinary Tract: Adrenal glands are normal. There is
bilateral hydronephrosis at the level of the kidneys and renal
pelves. I cannot clearly identify dilated ureters. Catheter is
present in the bladder. I do not see evidence of stone disease.

Stomach/Bowel: Chronic diverticulosis of the colon without evidence
of acute diverticulitis. No acute bowel pathology is seen.

Vascular/Lymphatic: No aortic calcification or aortic aneurysm. IVC
is normal. No retroperitoneal adenopathy.

Reproductive: Enlarged prostate.

Other: No free fluid or air.

Musculoskeletal: Ordinary lumbar degenerative disc disease and
degenerative facet disease.
IMPRESSION: Chronic fullness of the renal collecting systems and renal pelves. I
cannot clearly identify dilated ureters. No evidence of stone
disease. Foley catheter in the bladder. Enlarged prostate. The
findings could relate to chronic UPJ stenoses or could be sequela of
chronic bladder outlet obstruction relieved by the Foley catheter.

## 2021-04-24 ENCOUNTER — Telehealth: Payer: Self-pay

## 2021-04-24 NOTE — Telephone Encounter (Signed)
Received referral from Dr. Hollie Salk for prolonged bleeding and decreased access flow. Sent to scheduling for fistulogram. ?

## 2021-04-24 NOTE — Telephone Encounter (Signed)
Spoke with patient. He declined to schedule fistulogram with our office or to have completed at [facility]. He stated, "I want to go where I've been going." Advised patient to contact dialysis center. He verbalized understanding.  ? ?Spoke with Myrna Blazer. At [facility] regarding the above situation. She advised she will have patient scheduled at [facility].   ?

## 2021-10-03 ENCOUNTER — Encounter: Payer: Self-pay | Admitting: Vascular Surgery

## 2021-10-03 ENCOUNTER — Ambulatory Visit (INDEPENDENT_AMBULATORY_CARE_PROVIDER_SITE_OTHER): Payer: Medicare Other | Admitting: Vascular Surgery

## 2021-10-03 VITALS — BP 191/84 | HR 60 | Temp 98.1°F | Resp 20 | Ht 70.0 in | Wt 186.0 lb

## 2021-10-03 DIAGNOSIS — N186 End stage renal disease: Secondary | ICD-10-CM | POA: Diagnosis not present

## 2021-10-03 DIAGNOSIS — Z992 Dependence on renal dialysis: Secondary | ICD-10-CM | POA: Diagnosis not present

## 2021-10-03 NOTE — Progress Notes (Signed)
REASON FOR VISIT:   To evaluate for new access.  The consult is requested by Dr. Otelia Santee.  MEDICAL ISSUES:   END-STAGE RENAL DISEASE: This patient has a chronically occluded left basilic vein transposition.  He has had multiple interventions and needs new access.  I would agree with Dr. Augustin Coupe that his next best option would be a left upper arm graft.  I reviewed his previous vein map on the right which showed no usable vein on the right for fistula.  Hopefully we can get up high enough on the subclavian vein to have good outflow.  I have discussed the indications for the procedure and the potential complications with the patient and he is agreeable to proceed.  He dialyzes on Tuesdays Thursdays and Saturdays so we have scheduled this for Monday, 10/21/2021   HPI:   Matthew [name] is a pleasant 71 y.o. male who was referred for evaluation of a left upper arm AV graft.  I have reviewed the records from the referring office.  The patient has a left basilic vein transposition which is thrombosed multiple times and he has undergone multiple interventions for this.  This reason it was recommended that he be evaluated for a left upper arm graft.  He had a tunneled dialysis catheter placed on 09/11/2021.  This was in the right IJ.  On my history the patient denies any uremic symptoms.  He has had multiple interventions in the left arm fistula which is chronically occluded now.  Past Medical History:  Diagnosis Date   Arthritis    Diabetes ([place])    pt stated that he is no longer being treated for DM   ESRD (end stage renal disease) ([place]) 04/26/2018   Essential hypertension    Pt statewd that he now has low BP    Family History  Problem Relation Age of Onset   Hypertension Mother    Migraines Mother    Heart disease Mother     SOCIAL HISTORY: Social History   Tobacco Use   Smoking status: Former    Packs/day: 0.25    Years: 1.00    Total pack years: 0.25    Types: Cigarettes     Quit date: 01/27/1997    Years since quitting: 24.6   Smokeless tobacco: Never  Substance Use Topics   Alcohol use: Yes    Alcohol/week: 10.0 - 12.0 standard drinks of alcohol    Types: 10 - 12 Cans of beer per week    Comment: occasional    No Known Allergies  Current Outpatient Medications  Medication Sig Dispense Refill   aspirin 325 MG tablet Take 325 mg by mouth as needed for mild pain (thin blood).     B Complex-C-Zn-Folic Acid (DIALYVITE/ZINC) TABS Take by mouth.     lidocaine-prilocaine (EMLA) cream APPLY SMALL AMOUNT TO ACCESS SITE (AVF) 1 HOUR BEFORE DIALYSIS. COVER WITH OCCLUSIVE DRESSING (SARAN WRAP)     Multiple Vitamin (MULTIVITAMIN WITH MINERALS) TABS tablet Take 1 tablet by mouth daily. 30 tablet 0   RENVELA 800 MG tablet Take 3,200 mg by mouth 3 (three) times daily.     VELPHORO 500 MG chewable tablet Take 2 tablet by mouth three times a day with meals and 1 BID prn with snacks     No current facility-administered medications for this visit.    REVIEW OF SYSTEMS:  [X]  denotes positive finding, [ ]  denotes negative finding Cardiac  Comments:  Chest pain or chest pressure:  Shortness of breath upon exertion:    Short of breath when lying flat:    Irregular heart rhythm:        Vascular    Pain in calf, thigh, or hip brought on by ambulation:    Pain in feet at night that wakes you up from your sleep:     Blood clot in your veins:    Leg swelling:         Pulmonary    Oxygen at home:    Productive cough:     Wheezing:         Neurologic    Sudden weakness in arms or legs:     Sudden numbness in arms or legs:     Sudden onset of difficulty speaking or slurred speech:    Temporary loss of vision in one eye:     Problems with dizziness:         Gastrointestinal    Blood in stool:     Vomited blood:         Genitourinary    Burning when urinating:     Blood in urine:        Psychiatric    Major depression:         Hematologic    Bleeding  problems:    Problems with blood clotting too easily:        Skin    Rashes or ulcers:        Constitutional    Fever or chills:     PHYSICAL EXAM:   Vitals:   10/03/21 1614  BP: (!) 191/84  Pulse: 60  Resp: 20  Temp: 98.1 F (36.7 C)  SpO2: 98%  Weight: 186 lb (84.4 kg)  Height: 5\' 10"  (1.778 m)    GENERAL: The patient is a well-nourished male, in no acute distress. The vital signs are documented above. CARDIAC: There is a regular rate and rhythm.  VASCULAR: He has a palpable radial pulse bilaterally. His upper arm fistula is chronically occluded. PULMONARY: There is good air exchange bilaterally without wheezing or rales. ABDOMEN: Soft and non-tender with normal pitched bowel sounds.  MUSCULOSKELETAL: There are no major deformities or cyanosis. NEUROLOGIC: No focal weakness or paresthesias are detected. SKIN: There are no ulcers or rashes noted. PSYCHIATRIC: The patient has a normal affect.  DATA:    UPPER EXTREMITY VEIN MAP: I did review his previous vein map that was done in September 2020.  The upper arm and forearm cephalic vein on the right could not be identified.  The basilic vein was small in size with thrombus at the antecubital fossa.  On the left side the upper arm and forearm cephalic vein were not usable.  The basilic vein was marginal in size.  Deitra Mayo [facility] [phone]

## 2021-10-03 NOTE — H&P (View-Only) (Signed)
REASON FOR VISIT:   To evaluate for new access.  The consult is requested by Dr. Otelia Santee.  MEDICAL ISSUES:   END-STAGE RENAL DISEASE: This patient has a chronically occluded left basilic vein transposition.  He has had multiple interventions and needs new access.  I would agree with Dr. Augustin Coupe that his next best option would be a left upper arm graft.  I reviewed his previous vein map on the right which showed no usable vein on the right for fistula.  Hopefully we can get up high enough on the subclavian vein to have good outflow.  I have discussed the indications for the procedure and the potential complications with the patient and he is agreeable to proceed.  He dialyzes on Tuesdays Thursdays and Saturdays so we have scheduled this for Monday, 10/21/2021   HPI:   Matthew [name] is a pleasant 71 y.o. male who was referred for evaluation of a left upper arm AV graft.  I have reviewed the records from the referring office.  The patient has a left basilic vein transposition which is thrombosed multiple times and he has undergone multiple interventions for this.  This reason it was recommended that he be evaluated for a left upper arm graft.  He had a tunneled dialysis catheter placed on 09/11/2021.  This was in the right IJ.  On my history the patient denies any uremic symptoms.  He has had multiple interventions in the left arm fistula which is chronically occluded now.  Past Medical History:  Diagnosis Date   Arthritis    Diabetes ([place])    pt stated that he is no longer being treated for DM   ESRD (end stage renal disease) ([place]) 04/26/2018   Essential hypertension    Pt statewd that he now has low BP    Family History  Problem Relation Age of Onset   Hypertension Mother    Migraines Mother    Heart disease Mother     SOCIAL HISTORY: Social History   Tobacco Use   Smoking status: Former    Packs/day: 0.25    Years: 1.00    Total pack years: 0.25    Types: Cigarettes     Quit date: 01/27/1997    Years since quitting: 24.6   Smokeless tobacco: Never  Substance Use Topics   Alcohol use: Yes    Alcohol/week: 10.0 - 12.0 standard drinks of alcohol    Types: 10 - 12 Cans of beer per week    Comment: occasional    No Known Allergies  Current Outpatient Medications  Medication Sig Dispense Refill   aspirin 325 MG tablet Take 325 mg by mouth as needed for mild pain (thin blood).     B Complex-C-Zn-Folic Acid (DIALYVITE/ZINC) TABS Take by mouth.     lidocaine-prilocaine (EMLA) cream APPLY SMALL AMOUNT TO ACCESS SITE (AVF) 1 HOUR BEFORE DIALYSIS. COVER WITH OCCLUSIVE DRESSING (SARAN WRAP)     Multiple Vitamin (MULTIVITAMIN WITH MINERALS) TABS tablet Take 1 tablet by mouth daily. 30 tablet 0   RENVELA 800 MG tablet Take 3,200 mg by mouth 3 (three) times daily.     VELPHORO 500 MG chewable tablet Take 2 tablet by mouth three times a day with meals and 1 BID prn with snacks     No current facility-administered medications for this visit.    REVIEW OF SYSTEMS:  [X]  denotes positive finding, [ ]  denotes negative finding Cardiac  Comments:  Chest pain or chest pressure:  Shortness of breath upon exertion:    Short of breath when lying flat:    Irregular heart rhythm:        Vascular    Pain in calf, thigh, or hip brought on by ambulation:    Pain in feet at night that wakes you up from your sleep:     Blood clot in your veins:    Leg swelling:         Pulmonary    Oxygen at home:    Productive cough:     Wheezing:         Neurologic    Sudden weakness in arms or legs:     Sudden numbness in arms or legs:     Sudden onset of difficulty speaking or slurred speech:    Temporary loss of vision in one eye:     Problems with dizziness:         Gastrointestinal    Blood in stool:     Vomited blood:         Genitourinary    Burning when urinating:     Blood in urine:        Psychiatric    Major depression:         Hematologic    Bleeding  problems:    Problems with blood clotting too easily:        Skin    Rashes or ulcers:        Constitutional    Fever or chills:     PHYSICAL EXAM:   Vitals:   10/03/21 1614  BP: (!) 191/84  Pulse: 60  Resp: 20  Temp: 98.1 F (36.7 C)  SpO2: 98%  Weight: 186 lb (84.4 kg)  Height: 5\' 10"  (1.778 m)    GENERAL: The patient is a well-nourished male, in no acute distress. The vital signs are documented above. CARDIAC: There is a regular rate and rhythm.  VASCULAR: He has a palpable radial pulse bilaterally. His upper arm fistula is chronically occluded. PULMONARY: There is good air exchange bilaterally without wheezing or rales. ABDOMEN: Soft and non-tender with normal pitched bowel sounds.  MUSCULOSKELETAL: There are no major deformities or cyanosis. NEUROLOGIC: No focal weakness or paresthesias are detected. SKIN: There are no ulcers or rashes noted. PSYCHIATRIC: The patient has a normal affect.  DATA:    UPPER EXTREMITY VEIN MAP: I did review his previous vein map that was done in September 2020.  The upper arm and forearm cephalic vein on the right could not be identified.  The basilic vein was small in size with thrombus at the antecubital fossa.  On the left side the upper arm and forearm cephalic vein were not usable.  The basilic vein was marginal in size.  Deitra Mayo [facility] [phone]

## 2021-10-04 ENCOUNTER — Other Ambulatory Visit: Payer: Self-pay

## 2021-10-04 DIAGNOSIS — N186 End stage renal disease: Secondary | ICD-10-CM

## 2021-10-17 ENCOUNTER — Encounter (HOSPITAL_COMMUNITY): Payer: Self-pay | Admitting: Vascular Surgery

## 2021-10-17 ENCOUNTER — Other Ambulatory Visit: Payer: Self-pay

## 2021-10-17 NOTE — Progress Notes (Signed)
PCP - Dr. Nyoka Cowden moved out of [place] does not have PCP Cardiologist - Denies EKG - 10/21/21 Chest x-ray -  ECHO - 05/06/18 Cardiac Cath - Denies CPAP - Denies Fasting Blood Sugar:  RN checks Checks Blood Sugar:  2 x week Blood Thinner Instructions: Denies Aspirin Instructions: Patient will call Dr. Scot Dock to make sure he can continue aspirin through day of surgery.   Anesthesia review: No  -------------  SDW INSTRUCTIONS:  Your procedure is scheduled on 9.25.23. Please report to [facility] Main Entrance "A" at 5:30 A.M., and check in at the Admitting office. Call this number if you have problems the morning of surgery: [phone]   Remember: Do not eat or drink after midnight the night before your surgery    Medications to take morning of surgery with a sip of water include: Renvela  If needed Tylenol Requested patient call Dr. Scot Dock to make sure Aspirin is ok to continue through day of surgery  As of today, STOP taking any Aspirin (unless otherwise instructed by your surgeon), Aleve, Naproxen, Ibuprofen, Motrin, Advil, Goody's, BC's, all herbal medications, fish oil, and all vitamins.    The Morning of Surgery Do not wear jewelry Do not wear lotions, powders, or perfumes/colognes, or deodorant Do not bring valuables to the hospital. [facility] is not responsible for any belongings or valuables.   Remember that you must have someone to transport you home after your surgery, and remain with you for 24 hours if you are discharged the same day.  Please bring cases for contacts, glasses, hearing aids, dentures or bridgework because it cannot be worn into surgery.   Patients discharged the day of surgery will not be allowed to drive home.   Please shower the NIGHT BEFORE/MORNING OF SURGERY (use antibacterial soap like DIAL soap if possible). Wear comfortable clothes the morning of surgery. Oral Hygiene is also important to reduce your risk of infection.  Remember - BRUSH YOUR  TEETH THE MORNING OF SURGERY WITH YOUR REGULAR TOOTHPASTE  Patient denies shortness of breath, fever, cough and chest pain.

## 2021-10-20 NOTE — Anesthesia Preprocedure Evaluation (Addendum)
Anesthesia Evaluation  Patient identified by MRN, date of birth, ID band Patient awake    Reviewed: Allergy & Precautions, H&P , NPO status , Patient's Chart, lab work & pertinent test results  Airway Mallampati: II  TM Distance: >3 FB Neck ROM: Full    Dental no notable dental hx. (+) Teeth Intact, Dental Advisory Given   Pulmonary neg pulmonary ROS, former smoker,    Pulmonary exam normal breath sounds clear to auscultation       Cardiovascular Exercise Tolerance: Good hypertension, Pt. on medications  Rhythm:Regular Rate:Normal     Neuro/Psych negative neurological ROS  negative psych ROS   GI/Hepatic negative GI ROS, Neg liver ROS,   Endo/Other  diabetes  Renal/GU ESRF and DialysisRenal disease  negative genitourinary   Musculoskeletal  (+) Arthritis , Osteoarthritis,    Abdominal   Peds  Hematology  (+) Blood dyscrasia, anemia ,   Anesthesia Other Findings   Reproductive/Obstetrics negative OB ROS                            Anesthesia Physical Anesthesia Plan  ASA: 3  Anesthesia Plan: General   Post-op Pain Management: Tylenol PO (pre-op)*   Induction: Intravenous  PONV Risk Score and Plan: 2 and Propofol infusion and Ondansetron  Airway Management Planned: LMA  Additional Equipment:   Intra-op Plan:   Post-operative Plan: Extubation in OR  Informed Consent: I have reviewed the patients History and Physical, chart, labs and discussed the procedure including the risks, benefits and alternatives for the proposed anesthesia with the patient or authorized representative who has indicated his/her understanding and acceptance.     Dental advisory given  Plan Discussed with: CRNA  Anesthesia Plan Comments:        Anesthesia Quick Evaluation

## 2021-10-21 ENCOUNTER — Ambulatory Visit (HOSPITAL_BASED_OUTPATIENT_CLINIC_OR_DEPARTMENT_OTHER): Payer: Medicare Other | Admitting: Anesthesiology

## 2021-10-21 ENCOUNTER — Ambulatory Visit (HOSPITAL_COMMUNITY): Payer: Medicare Other | Admitting: Anesthesiology

## 2021-10-21 ENCOUNTER — Other Ambulatory Visit: Payer: Self-pay

## 2021-10-21 ENCOUNTER — Encounter (HOSPITAL_COMMUNITY)
Admission: RE | Disposition: A | Discharge status: Home / Self Care | Payer: Self-pay | Source: Home / Self Care | Attending: Vascular Surgery

## 2021-10-21 ENCOUNTER — Encounter (HOSPITAL_COMMUNITY): Payer: Self-pay | Admitting: Vascular Surgery

## 2021-10-21 ENCOUNTER — Ambulatory Visit (HOSPITAL_COMMUNITY)
Admission: RE | Admit: 2021-10-21 | Discharge: 2021-10-21 | Disposition: A | Discharge status: Home / Self Care | Payer: Medicare Other | Attending: Vascular Surgery | Admitting: Vascular Surgery

## 2021-10-21 DIAGNOSIS — I120 Hypertensive chronic kidney disease with stage 5 chronic kidney disease or end stage renal disease: Secondary | ICD-10-CM | POA: Insufficient documentation

## 2021-10-21 DIAGNOSIS — Y832 Surgical operation with anastomosis, bypass or graft as the cause of abnormal reaction of the patient, or of later complication, without mention of misadventure at the time of the procedure: Secondary | ICD-10-CM | POA: Insufficient documentation

## 2021-10-21 DIAGNOSIS — N186 End stage renal disease: Secondary | ICD-10-CM | POA: Diagnosis not present

## 2021-10-21 DIAGNOSIS — D631 Anemia in chronic kidney disease: Secondary | ICD-10-CM

## 2021-10-21 DIAGNOSIS — Z992 Dependence on renal dialysis: Secondary | ICD-10-CM

## 2021-10-21 DIAGNOSIS — E1122 Type 2 diabetes mellitus with diabetic chronic kidney disease: Secondary | ICD-10-CM

## 2021-10-21 DIAGNOSIS — N185 Chronic kidney disease, stage 5: Secondary | ICD-10-CM | POA: Diagnosis not present

## 2021-10-21 DIAGNOSIS — Z87891 Personal history of nicotine dependence: Secondary | ICD-10-CM | POA: Diagnosis not present

## 2021-10-21 DIAGNOSIS — T82868A Thrombosis of vascular prosthetic devices, implants and grafts, initial encounter: Secondary | ICD-10-CM | POA: Insufficient documentation

## 2021-10-21 DIAGNOSIS — Z79899 Other long term (current) drug therapy: Secondary | ICD-10-CM | POA: Diagnosis not present

## 2021-10-21 HISTORY — PX: AV FISTULA PLACEMENT: SHX1204

## 2021-10-21 LAB — POCT I-STAT, CHEM 8
BUN: 60 mg/dL — ABNORMAL HIGH (ref 8–23)
Calcium, Ion: 1 mmol/L — ABNORMAL LOW (ref 1.15–1.40)
Chloride: 96 mmol/L — ABNORMAL LOW (ref 98–111)
Creatinine, Ser: 11 mg/dL — ABNORMAL HIGH (ref 0.61–1.24)
Glucose, Bld: 88 mg/dL (ref 70–99)
HCT: 41 % (ref 39.0–52.0)
Hemoglobin: 13.9 g/dL (ref 13.0–17.0)
Potassium: 4.3 mmol/L (ref 3.5–5.1)
Sodium: 134 mmol/L — ABNORMAL LOW (ref 135–145)
TCO2: 28 mmol/L (ref 22–32)

## 2021-10-21 LAB — GLUCOSE, CAPILLARY
Glucose-Capillary: 86 mg/dL (ref 70–99)
Glucose-Capillary: 84 mg/dL (ref 70–99)

## 2021-10-21 SURGERY — INSERTION OF ARTERIOVENOUS (AV) GORE-TEX GRAFT ARM
Anesthesia: General | Site: Arm Upper | Laterality: Left

## 2021-10-21 MED ORDER — PHENYLEPHRINE HCL-NACL 20-0.9 MG/250ML-% IV SOLN
INTRAVENOUS | Status: DC | PRN
  Administered 2021-10-21: 20 ug/min via INTRAVENOUS

## 2021-10-21 MED ORDER — CHLORHEXIDINE GLUCONATE 0.12 % MT SOLN
15.0000 mL | Freq: Once | OROMUCOSAL | Status: AC
  Administered 2021-10-21: 15 mL via OROMUCOSAL
  Filled 2021-10-21: qty 15

## 2021-10-21 MED ORDER — PROTAMINE SULFATE 10 MG/ML IV SOLN
INTRAVENOUS | Status: AC
  Filled 2021-10-21: qty 5

## 2021-10-21 MED ORDER — HEPARIN SODIUM (PORCINE) 1000 UNIT/ML IJ SOLN
INTRAMUSCULAR | Status: AC
  Filled 2021-10-21: qty 10

## 2021-10-21 MED ORDER — ONDANSETRON HCL 4 MG/2ML IJ SOLN
INTRAMUSCULAR | Status: AC
  Filled 2021-10-21: qty 2

## 2021-10-21 MED ORDER — DEXAMETHASONE SODIUM PHOSPHATE 10 MG/ML IJ SOLN
INTRAMUSCULAR | Status: DC | PRN
  Administered 2021-10-21: 5 mg via INTRAVENOUS

## 2021-10-21 MED ORDER — INSULIN ASPART 100 UNIT/ML IJ SOLN: 0.0000 [IU] | INTRAMUSCULAR | Status: DC | PRN

## 2021-10-21 MED ORDER — 0.9 % SODIUM CHLORIDE (POUR BTL) OPTIME
TOPICAL | Status: DC | PRN
  Administered 2021-10-21: 1000 mL

## 2021-10-21 MED ORDER — PHENYLEPHRINE 80 MCG/ML (10ML) SYRINGE FOR IV PUSH (FOR BLOOD PRESSURE SUPPORT)
PREFILLED_SYRINGE | INTRAVENOUS | Status: DC | PRN
  Administered 2021-10-21 (×2): 80 ug via INTRAVENOUS

## 2021-10-21 MED ORDER — LIDOCAINE-EPINEPHRINE (PF) 1 %-1:200000 IJ SOLN
INTRAMUSCULAR | Status: DC | PRN
  Administered 2021-10-21: 30 mL

## 2021-10-21 MED ORDER — CEFAZOLIN SODIUM-DEXTROSE 2-4 GM/100ML-% IV SOLN
2.0000 g | INTRAVENOUS | Status: AC
  Administered 2021-10-21: 2 g via INTRAVENOUS
  Filled 2021-10-21: qty 100

## 2021-10-21 MED ORDER — LIDOCAINE-EPINEPHRINE (PF) 1 %-1:200000 IJ SOLN
INTRAMUSCULAR | Status: AC
  Filled 2021-10-21: qty 30

## 2021-10-21 MED ORDER — PHENYLEPHRINE 80 MCG/ML (10ML) SYRINGE FOR IV PUSH (FOR BLOOD PRESSURE SUPPORT)
PREFILLED_SYRINGE | INTRAVENOUS | Status: AC
  Filled 2021-10-21: qty 10

## 2021-10-21 MED ORDER — HEPARIN 6000 UNIT IRRIGATION SOLUTION
Status: AC
  Filled 2021-10-21: qty 500

## 2021-10-21 MED ORDER — ORAL CARE MOUTH RINSE: 15.0000 mL | Freq: Once | OROMUCOSAL | Status: AC

## 2021-10-21 MED ORDER — FENTANYL CITRATE (PF) 250 MCG/5ML IJ SOLN
INTRAMUSCULAR | Status: AC
  Filled 2021-10-21: qty 5

## 2021-10-21 MED ORDER — SODIUM CHLORIDE 0.9 % IV SOLN: INTRAVENOUS | Status: DC

## 2021-10-21 MED ORDER — FENTANYL CITRATE (PF) 250 MCG/5ML IJ SOLN
INTRAMUSCULAR | Status: DC | PRN
  Administered 2021-10-21 (×3): 25 ug via INTRAVENOUS

## 2021-10-21 MED ORDER — HEPARIN 6000 UNIT IRRIGATION SOLUTION
Status: DC | PRN
  Administered 2021-10-21: 1

## 2021-10-21 MED ORDER — OXYCODONE-ACETAMINOPHEN 5-325 MG PO TABS: 1.0000 | ORAL_TABLET | Freq: Four times a day (QID) | ORAL | 0 refills | Status: DC | PRN

## 2021-10-21 MED ORDER — LIDOCAINE 2% (20 MG/ML) 5 ML SYRINGE
INTRAMUSCULAR | Status: DC | PRN
  Administered 2021-10-21: 80 mg via INTRAVENOUS

## 2021-10-21 MED ORDER — ONDANSETRON HCL 4 MG/2ML IJ SOLN
INTRAMUSCULAR | Status: DC | PRN
  Administered 2021-10-21: 4 mg via INTRAVENOUS

## 2021-10-21 MED ORDER — CHLORHEXIDINE GLUCONATE 4 % EX LIQD: 60.0000 mL | Freq: Once | CUTANEOUS | Status: DC

## 2021-10-21 MED ORDER — DEXAMETHASONE SODIUM PHOSPHATE 10 MG/ML IJ SOLN
INTRAMUSCULAR | Status: AC
  Filled 2021-10-21: qty 1

## 2021-10-21 MED ORDER — LIDOCAINE 2% (20 MG/ML) 5 ML SYRINGE
INTRAMUSCULAR | Status: AC
  Filled 2021-10-21: qty 5

## 2021-10-21 MED ORDER — ACETAMINOPHEN 500 MG PO TABS
1000.0000 mg | ORAL_TABLET | Freq: Once | ORAL | Status: AC
  Administered 2021-10-21: 1000 mg via ORAL
  Filled 2021-10-21: qty 2

## 2021-10-21 MED ORDER — HEPARIN SODIUM (PORCINE) 1000 UNIT/ML IJ SOLN
INTRAMUSCULAR | Status: DC | PRN
  Administered 2021-10-21: 8000 [IU] via INTRAVENOUS

## 2021-10-21 MED ORDER — FENTANYL CITRATE (PF) 100 MCG/2ML IJ SOLN: 25.0000 ug | INTRAMUSCULAR | Status: DC | PRN

## 2021-10-21 MED ORDER — PROTAMINE SULFATE 10 MG/ML IV SOLN
INTRAVENOUS | Status: DC | PRN
  Administered 2021-10-21: 20 mg via INTRAVENOUS
  Administered 2021-10-21: 30 mg via INTRAVENOUS

## 2021-10-21 MED ORDER — PROPOFOL 10 MG/ML IV BOLUS
INTRAVENOUS | Status: DC | PRN
  Administered 2021-10-21: 50 mg via INTRAVENOUS
  Administered 2021-10-21: 100 mg via INTRAVENOUS

## 2021-10-21 MED ORDER — LIDOCAINE HCL 1 % IJ SOLN
INTRAMUSCULAR | Status: AC
  Filled 2021-10-21: qty 20

## 2021-10-21 SURGICAL SUPPLY — 35 items
ADH SKN CLS APL DERMABOND .7 (GAUZE/BANDAGES/DRESSINGS) ×1
ARMBAND PINK RESTRICT EXTREMIT (MISCELLANEOUS) ×2 IMPLANT
BLADE STAB KNIFE 15DEG (BLADE) IMPLANT
CANISTER SUCT 3000ML PPV (MISCELLANEOUS) ×1 IMPLANT
CANNULA VESSEL 3MM 2 BLNT TIP (CANNULA) ×1 IMPLANT
CATH EMB 4FR 40CM (CATHETERS) IMPLANT
CLIP VESOCCLUDE MED 6/CT (CLIP) ×1 IMPLANT
CLIP VESOCCLUDE SM WIDE 6/CT (CLIP) ×1 IMPLANT
DERMABOND ADVANCED .7 DNX12 (GAUZE/BANDAGES/DRESSINGS) ×1 IMPLANT
ELECT REM PT RETURN 9FT ADLT (ELECTROSURGICAL) ×1
ELECTRODE REM PT RTRN 9FT ADLT (ELECTROSURGICAL) ×1 IMPLANT
GLOVE BIO SURGEON STRL SZ 6.5 (GLOVE) IMPLANT
GLOVE BIO SURGEON STRL SZ7.5 (GLOVE) ×1 IMPLANT
GLOVE BIOGEL PI IND STRL 8 (GLOVE) ×1 IMPLANT
GLOVE SURG POLY ORTHO LF SZ7.5 (GLOVE) IMPLANT
GLOVE SURG SS PI 7.0 STRL IVOR (GLOVE) IMPLANT
GLOVE SURG UNDER LTX SZ8 (GLOVE) ×1 IMPLANT
GOWN STRL REUS W/ TWL LRG LVL3 (GOWN DISPOSABLE) ×3 IMPLANT
GOWN STRL REUS W/TWL LRG LVL3 (GOWN DISPOSABLE) ×3
GRAFT GORETEX STRT 4-7X45 (Vascular Products) IMPLANT
INSERT FOGARTY SM (MISCELLANEOUS) IMPLANT
KIT BASIN OR (CUSTOM PROCEDURE TRAY) ×1 IMPLANT
KIT TURNOVER KIT B (KITS) ×1 IMPLANT
NS IRRIG 1000ML POUR BTL (IV SOLUTION) ×1 IMPLANT
PACK CV ACCESS (CUSTOM PROCEDURE TRAY) ×1 IMPLANT
PAD ARMBOARD 7.5X6 YLW CONV (MISCELLANEOUS) ×2 IMPLANT
SPIKE FLUID TRANSFER (MISCELLANEOUS) ×1 IMPLANT
SPONGE T-LAP 18X18 ~~LOC~~+RFID (SPONGE) IMPLANT
SUT MNCRL AB 4-0 PS2 18 (SUTURE) ×2 IMPLANT
SUT PROLENE 6 0 BV (SUTURE) ×2 IMPLANT
SUT VIC AB 3-0 SH 27 (SUTURE) ×2
SUT VIC AB 3-0 SH 27X BRD (SUTURE) ×2 IMPLANT
TOWEL GREEN STERILE (TOWEL DISPOSABLE) ×1 IMPLANT
UNDERPAD 30X36 HEAVY ABSORB (UNDERPADS AND DIAPERS) ×1 IMPLANT
WATER STERILE IRR 1000ML POUR (IV SOLUTION) ×1 IMPLANT

## 2021-10-21 NOTE — Interval H&P Note (Signed)
History and Physical Interval Note:  10/21/2021 7:11 AM  Matthew [name]  has presented today for surgery, with the diagnosis of ESRD.  The various methods of treatment have been discussed with the patient and family. After consideration of risks, benefits and other options for treatment, the patient has consented to  Procedure(s): INSERTION OF LEFT ARM ARTERIOVENOUS (AV) GORE-TEX GRAFT (Left) as a surgical intervention.  The patient's history has been reviewed, patient examined, no change in status, stable for surgery.  I have reviewed the patient's chart and labs.  Questions were answered to the patient's satisfaction.     Deitra Mayo

## 2021-10-21 NOTE — Anesthesia Procedure Notes (Signed)
Procedure Name: LMA Insertion Date/Time: 10/21/2021 7:38 AM  Performed by: Erick Colace, CRNAPre-anesthesia Checklist: Patient identified, Emergency Drugs available, Suction available and Patient being monitored Patient Re-evaluated:Patient Re-evaluated prior to induction Oxygen Delivery Method: Circle system utilized Preoxygenation: Pre-oxygenation with 100% oxygen Induction Type: IV induction Ventilation: Mask ventilation without difficulty LMA: LMA inserted LMA Size: 4.0 Laser Tube: Cuffed inflated with minimal occlusive pressure - saline Placement Confirmation: positive ETCO2 and breath sounds checked- equal and bilateral Tube secured with: Tape Dental Injury: Teeth and Oropharynx as per pre-operative assessment

## 2021-10-21 NOTE — Progress Notes (Signed)
Left arm Thrill and Bruit present

## 2021-10-21 NOTE — Op Note (Signed)
NAME: Matthew [name]    MRN: [id number] DOB: 02/27/1950    DATE OF OPERATION: 10/21/2021  PREOP DIAGNOSIS:    End-stage renal disease  POSTOP DIAGNOSIS:    Same  PROCEDURE:    New left upper arm AV graft  SURGEON: Judeth Cornfield. Scot Dock, MD  ASSIST: Leontine Locket, PA  ANESTHESIA: General  EBL: Minimal  INDICATIONS:    Matthew [name] is a 71 y.o. male who previously had a left basilic vein transposition.  He had multiple interventions including a stent in the axilla.  The fistula could no longer be salvaged and I felt it was reasonable to attempt a new upper arm graft.  FINDINGS:   The stent extended very high in the axilla and I was unable to get above this for a new graft.  However there was an adjacent vein that was somewhat small but did take up to a 5 mm dilator.  A new upper arm graft was placed and the patient had an excellent thrill and a palpable radial pulse at the completion of the procedure.  TECHNIQUE:   The patient was taken to the operating room and received a general anesthetic.  The left arm was prepped and draped in usual sterile fashion.  A an oblique incision was made just above the antecubital level and here the brachial artery was dissected free beneath the fascia.  And had an excellent pulse.  A separate incision was made beneath the axilla.  The dissection was carried down to the basilic vein fistula which was thrombosed.  I tracked this off proximally and there was a stent present which extended very high in the axilla.  I tried to get above this but this was not possible.  I therefore dissected deeper in the wound behind the brachial artery and was able to find an adjacent vein.  This was somewhat small.  However I felt this was the only option.  A tunnel was created between the 2 incisions and the patient was heparinized.  A 4-7 mm PTFE graft was tunneled between the 2 incisions and the.  The brachial artery was clamped proximally and distally and a  longitudinal arteriotomy was made.  A short segment of the 4 mm end of the graft was excised the graft slightly spatulated and sewn end-to-side to the brachial artery using continuous 6-0 Prolene suture.  Next the vein that I had identified was ligated distally and then spatulated.  It did take up to a 5 mm dilator.  It was irrigated with heparinized saline.  The vein was spatulated and the graft spatulated and the 2 were sewn into end with 2 continuous 6-0 Prolene sutures.  At the completion there was an excellent thrill in the fistula.  There was a palpable radial pulse.  The heparin was partially reversed with protamine.  Hemostasis was obtained in the wounds.  Each of the wounds was closed with 2 deep layers of 3-0 Vicryl and the skin closed with 4-0 Monocryl.  Dermabond was applied.  The patient tolerated the procedure well and was transferred to the recovery room in stable condition.  All needle and sponge counts were correct.  Given the complexity of the case,  the assistant was necessary in order to expedient the procedure and safely perform the technical aspects of the operation.  The assistant provided traction and countertraction to assist with exposure of the artery and vein.  They also assisted with suture ligation of multiple venous branches. They also assisted with  tunneling of the graft.  They played a critical role for both anastomoses.. These skills, especially following the Prolene suture for the anastomosis, could not have been adequately performed by a scrub tech assistant.    Deitra Mayo, MD, FACS [facility]  DATE OF DICTATION:   10/21/2021

## 2021-10-21 NOTE — Transfer of Care (Signed)
Immediate Anesthesia Transfer of Care Note  Patient: Matthew [name]  Procedure(s) Performed: INSERTION OF LEFT ARM ARTERIOVENOUS (AV) GORE-TEX GRAFT (Left: Arm Upper)  Patient Location: PACU  Anesthesia Type:General  Level of Consciousness: awake  Airway & Oxygen Therapy: Patient Spontanous Breathing and Patient connected to face mask oxygen  Post-op Assessment: Report given to RN and Post -op Vital signs reviewed and stable  Post vital signs: Reviewed and stable  Last Vitals:  Vitals Value Taken Time  BP 131/73 10/21/21 0950  Temp    Pulse 76 10/21/21 0954  Resp 20 10/21/21 0954  SpO2 98 % 10/21/21 0954  Vitals shown include unvalidated device data.  Last Pain:  Vitals:   10/21/21 0650  TempSrc:   PainSc: 0-No pain         Complications: No notable events documented.

## 2021-10-21 NOTE — Anesthesia Postprocedure Evaluation (Signed)
Anesthesia Post Note  Patient: Matthew [name]  Procedure(s) Performed: INSERTION OF LEFT ARM ARTERIOVENOUS (AV) GORE-TEX GRAFT (Left: Arm Upper)     Patient location during evaluation: PACU Anesthesia Type: General Level of consciousness: awake and alert Pain management: pain level controlled Vital Signs Assessment: post-procedure vital signs reviewed and stable Respiratory status: spontaneous breathing, nonlabored ventilation and respiratory function stable Cardiovascular status: blood pressure returned to baseline and stable Postop Assessment: no apparent nausea or vomiting Anesthetic complications: no   No notable events documented.  Last Vitals:  Vitals:   10/21/21 1015 10/21/21 1030  BP: 118/66 135/73  Pulse: 70 71  Resp: 13 14  Temp:  36.5 C  SpO2: 92% 96%    Last Pain:  Vitals:   10/21/21 1030  TempSrc:   PainSc: 0-No pain                 Angelina Venard,W. EDMOND

## 2021-10-21 NOTE — Discharge Instructions (Signed)
   [facility]  Discharge Instructions  AV Fistula or Graft Surgery for Dialysis Access  Please refer to the following instructions for your post-procedure care. Your surgeon or physician assistant will discuss any changes with you.  Activity  You may drive the day following your surgery, if you are comfortable and no longer taking prescription pain medication. Resume full activity as the soreness in your incision resolves.  Bathing/Showering  You may shower after you go home. Keep your incision dry for 48 hours. Do not soak in a bathtub, hot tub, or swim until the incision heals completely. You may not shower if you have a hemodialysis catheter.  Incision Care  Clean your incision with mild soap and water after 48 hours. Pat the area dry with a clean towel. You do not need a bandage unless otherwise instructed. Do not apply any ointments or creams to your incision. You may have skin glue on your incision. Do not peel it off. It will come off on its own in about one week. Your arm may swell a bit after surgery. To reduce swelling use pillows to elevate your arm so it is above your heart. Your doctor will tell you if you need to lightly wrap your arm with an ACE bandage.  Diet  Resume your normal diet. There are not special food restrictions following this procedure. In order to heal from your surgery, it is CRITICAL to get adequate nutrition. Your body requires vitamins, minerals, and protein. Vegetables are the best source of vitamins and minerals. Vegetables also provide the perfect balance of protein. Processed food has little nutritional value, so try to avoid this.  Medications  Resume taking all of your medications. If your incision is causing pain, you may take over-the counter pain relievers such as acetaminophen (Tylenol). If you were prescribed a stronger pain medication, please be aware these medications can cause nausea and constipation. Prevent  nausea by taking the medication with a snack or meal. Avoid constipation by drinking plenty of fluids and eating foods with high amount of fiber, such as fruits, vegetables, and grains.  Do not take Tylenol if you are taking prescription pain medications.  Follow up Your surgeon may want to see you in the office following your access surgery. If so, this will be arranged at the time of your surgery.  Please call us immediately for any of the following conditions:  Increased pain, redness, drainage (pus) from your incision site Fever of 101 degrees or higher Severe or worsening pain at your incision site Hand pain or numbness.  Reduce your risk of vascular disease:  Stop smoking. If you would like help, call [facility] at 1-800-QUIT-NOW (713)696-8677) or [facility] [place] your cholesterol Maintain a desired weight Control your diabetes Keep your blood pressure down  Dialysis  It will take several weeks to several months for your new dialysis access to be ready for use. Your surgeon will determine when it is okay to use it. Your nephrologist will continue to direct your dialysis. You can continue to use your Permcath until your new access is ready for use.   10/21/2021 Matthew [name] [id number] 04-Jan-1951  Surgeon(s): Angelia Mould, MD  Procedure(s): INSERTION OF LEFT ARM ARTERIOVENOUS (AV) GORE-TEX GRAFT  x Do not stick graft for 4 weeks    If you have any questions, please call the office at [phone].

## 2021-10-22 ENCOUNTER — Encounter (HOSPITAL_COMMUNITY): Payer: Self-pay | Admitting: Vascular Surgery

## 2022-01-17 ENCOUNTER — Emergency Department (HOSPITAL_COMMUNITY)
Admission: EM | Admit: 2022-01-17 | Discharge: 2022-01-18 | Discharge status: Against Medical Advice | Payer: Medicare Other | Attending: Student | Admitting: Student

## 2022-01-17 ENCOUNTER — Other Ambulatory Visit: Payer: Self-pay

## 2022-01-17 ENCOUNTER — Encounter (HOSPITAL_COMMUNITY): Payer: Self-pay

## 2022-01-17 DIAGNOSIS — Z1152 Encounter for screening for COVID-19: Secondary | ICD-10-CM | POA: Diagnosis not present

## 2022-01-17 DIAGNOSIS — Z5321 Procedure and treatment not carried out due to patient leaving prior to being seen by health care provider: Secondary | ICD-10-CM | POA: Diagnosis not present

## 2022-01-17 DIAGNOSIS — Z992 Dependence on renal dialysis: Secondary | ICD-10-CM | POA: Diagnosis not present

## 2022-01-17 DIAGNOSIS — J101 Influenza due to other identified influenza virus with other respiratory manifestations: Secondary | ICD-10-CM | POA: Insufficient documentation

## 2022-01-17 DIAGNOSIS — R0602 Shortness of breath: Secondary | ICD-10-CM | POA: Insufficient documentation

## 2022-01-17 DIAGNOSIS — R059 Cough, unspecified: Secondary | ICD-10-CM | POA: Diagnosis present

## 2022-01-17 DIAGNOSIS — I517 Cardiomegaly: Secondary | ICD-10-CM | POA: Diagnosis not present

## 2022-01-17 LAB — CBC WITH DIFFERENTIAL/PLATELET
Abs Immature Granulocytes: 0.01 10*3/uL (ref 0.00–0.07)
Basophils Absolute: 0 10*3/uL (ref 0.0–0.1)
Basophils Relative: 1 %
Eosinophils Absolute: 0.1 10*3/uL (ref 0.0–0.5)
Eosinophils Relative: 3 %
HCT: 33.9 % — ABNORMAL LOW (ref 39.0–52.0)
Hemoglobin: 11.3 g/dL — ABNORMAL LOW (ref 13.0–17.0)
Immature Granulocytes: 0 %
Lymphocytes Relative: 30 %
Lymphs Abs: 1.3 10*3/uL (ref 0.7–4.0)
MCH: 30.8 pg (ref 26.0–34.0)
MCHC: 33.3 g/dL (ref 30.0–36.0)
MCV: 92.4 fL (ref 80.0–100.0)
Monocytes Absolute: 0.7 10*3/uL (ref 0.1–1.0)
Monocytes Relative: 17 %
Neutro Abs: 2.1 10*3/uL (ref 1.7–7.7)
Neutrophils Relative %: 49 %
Platelets: 218 10*3/uL (ref 150–400)
RBC: 3.67 MIL/uL — ABNORMAL LOW (ref 4.22–5.81)
RDW: 16.9 % — ABNORMAL HIGH (ref 11.5–15.5)
WBC: 4.2 10*3/uL (ref 4.0–10.5)
nRBC: 0 % (ref 0.0–0.2)

## 2022-01-17 NOTE — ED Triage Notes (Addendum)
Pt to ED via [facility] from home. Pt c/o cough and shortness of breath x 2 weeks. Pt has not had dialysis since Sat 12/16.   Pt c/o weakness, dizziness, congestion, and cough x 3 days. Pt denies pain, nausea and vomiting.   EMS VS  152/80 HR=80 97% RA

## 2022-01-17 NOTE — ED Provider Triage Note (Signed)
Emergency Medicine Provider Triage Evaluation Note  Pablo Stauffer , a 71 y.o. male  was evaluated in triage.  Pt complains of SOB, cough, diarrhea x 1 week, has missed dialysis x 1 week secondary to symptoms. Denies fevers and chills.   Review of Systems  Positive: As above Negative: As above  Physical Exam  BP 119/65   Pulse 72   Temp 98 F (36.7 C) (Oral)   Resp 18   SpO2 100%  Gen:   Awake, no distress   Resp:  Normal effort  MSK:   Moves extremities without difficulty  Other:  RRR no m/r/g  Medical Decision Making  Medically screening exam initiated at 11:35 PM.  Appropriate orders placed.  Cono Gebhard was informed that the remainder of the evaluation will be completed by another provider, this initial triage assessment does not replace that evaluation, and the importance of remaining in the ED until their evaluation is complete.  This chart was dictated using voice recognition software, Dragon. Despite the best efforts of this provider to proofread and correct errors, errors may still occur which can change documentation meaning.    Emeline Darling, PA-C 01/17/22 2335

## 2022-01-18 ENCOUNTER — Emergency Department (HOSPITAL_COMMUNITY): Payer: Medicare Other

## 2022-01-18 LAB — COMPREHENSIVE METABOLIC PANEL
ALT: 12 U/L (ref 0–44)
AST: 18 U/L (ref 15–41)
Albumin: 3.5 g/dL (ref 3.5–5.0)
Alkaline Phosphatase: 39 U/L (ref 38–126)
Anion gap: 16 — ABNORMAL HIGH (ref 5–15)
BUN: 104 mg/dL — ABNORMAL HIGH (ref 8–23)
CO2: 26 mmol/L (ref 22–32)
Calcium: 7.9 mg/dL — ABNORMAL LOW (ref 8.9–10.3)
Chloride: 92 mmol/L — ABNORMAL LOW (ref 98–111)
Creatinine, Ser: 18.31 mg/dL — ABNORMAL HIGH (ref 0.61–1.24)
GFR, Estimated: 2 mL/min — ABNORMAL LOW (ref >60)
Glucose, Bld: 104 mg/dL — ABNORMAL HIGH (ref 70–99)
Potassium: 5 mmol/L (ref 3.5–5.1)
Sodium: 134 mmol/L — ABNORMAL LOW (ref 135–145)
Total Bilirubin: 0.5 mg/dL (ref 0.3–1.2)
Total Protein: 8 g/dL (ref 6.5–8.1)

## 2022-01-18 LAB — RESP PANEL BY RT-PCR (RSV, FLU A&B, COVID)  RVPGX2
Influenza A by PCR: POSITIVE — AB
Influenza B by PCR: NEGATIVE
Resp Syncytial Virus by PCR: NEGATIVE
SARS Coronavirus 2 by RT PCR: NEGATIVE

## 2022-01-18 LAB — BRAIN NATRIURETIC PEPTIDE: B Natriuretic Peptide: 106 pg/mL — ABNORMAL HIGH (ref 0.0–100.0)

## 2022-01-18 NOTE — ED Notes (Signed)
Called for vitals. No response 

## 2022-08-11 ENCOUNTER — Ambulatory Visit (INDEPENDENT_AMBULATORY_CARE_PROVIDER_SITE_OTHER): Payer: 59 | Admitting: Podiatry

## 2022-08-11 DIAGNOSIS — Z91199 Patient's noncompliance with other medical treatment and regimen due to unspecified reason: Secondary | ICD-10-CM

## 2022-08-11 NOTE — Progress Notes (Signed)
No show

## 2022-10-09 ENCOUNTER — Encounter (HOSPITAL_COMMUNITY): Payer: Self-pay

## 2022-10-09 ENCOUNTER — Emergency Department (HOSPITAL_COMMUNITY)
Admission: EM | Admit: 2022-10-09 | Discharge: 2022-10-10 | Disposition: A | Discharge status: Home / Self Care | Payer: 59 | Attending: Emergency Medicine | Admitting: Emergency Medicine

## 2022-10-09 ENCOUNTER — Other Ambulatory Visit: Payer: Self-pay

## 2022-10-09 DIAGNOSIS — D631 Anemia in chronic kidney disease: Secondary | ICD-10-CM | POA: Diagnosis not present

## 2022-10-09 DIAGNOSIS — R011 Cardiac murmur, unspecified: Secondary | ICD-10-CM | POA: Insufficient documentation

## 2022-10-09 DIAGNOSIS — R319 Hematuria, unspecified: Secondary | ICD-10-CM | POA: Insufficient documentation

## 2022-10-09 DIAGNOSIS — I120 Hypertensive chronic kidney disease with stage 5 chronic kidney disease or end stage renal disease: Secondary | ICD-10-CM | POA: Insufficient documentation

## 2022-10-09 DIAGNOSIS — Z7982 Long term (current) use of aspirin: Secondary | ICD-10-CM | POA: Insufficient documentation

## 2022-10-09 DIAGNOSIS — R1030 Lower abdominal pain, unspecified: Secondary | ICD-10-CM | POA: Insufficient documentation

## 2022-10-09 DIAGNOSIS — E1122 Type 2 diabetes mellitus with diabetic chronic kidney disease: Secondary | ICD-10-CM | POA: Insufficient documentation

## 2022-10-09 DIAGNOSIS — E876 Hypokalemia: Secondary | ICD-10-CM | POA: Insufficient documentation

## 2022-10-09 DIAGNOSIS — N186 End stage renal disease: Secondary | ICD-10-CM | POA: Diagnosis not present

## 2022-10-09 DIAGNOSIS — Z992 Dependence on renal dialysis: Secondary | ICD-10-CM | POA: Insufficient documentation

## 2022-10-09 LAB — URINALYSIS, ROUTINE W REFLEX MICROSCOPIC
Bilirubin Urine: NEGATIVE
Glucose, UA: 50 mg/dL — AB
Ketones, ur: NEGATIVE mg/dL
Leukocytes,Ua: NEGATIVE
Nitrite: NEGATIVE
Protein, ur: NEGATIVE mg/dL
RBC / HPF: >50 RBC/hpf (ref 0–5)
Specific Gravity, Urine: 1.005 (ref 1.005–1.030)
pH: 5 (ref 5.0–8.0)

## 2022-10-09 LAB — BASIC METABOLIC PANEL
Anion gap: 13 (ref 5–15)
BUN: 12 mg/dL (ref 8–23)
CO2: 29 mmol/L (ref 22–32)
Calcium: 7.7 mg/dL — ABNORMAL LOW (ref 8.9–10.3)
Chloride: 93 mmol/L — ABNORMAL LOW (ref 98–111)
Creatinine, Ser: 4.87 mg/dL — ABNORMAL HIGH (ref 0.61–1.24)
GFR, Estimated: 12 mL/min — ABNORMAL LOW (ref >60)
Glucose, Bld: 84 mg/dL (ref 70–99)
Potassium: 2.8 mmol/L — ABNORMAL LOW (ref 3.5–5.1)
Sodium: 135 mmol/L (ref 135–145)

## 2022-10-09 LAB — CBC
HCT: 34 % — ABNORMAL LOW (ref 39.0–52.0)
Hemoglobin: 10.5 g/dL — ABNORMAL LOW (ref 13.0–17.0)
MCH: 28.9 pg (ref 26.0–34.0)
MCHC: 30.9 g/dL (ref 30.0–36.0)
MCV: 93.7 fL (ref 80.0–100.0)
Platelets: 290 10*3/uL (ref 150–400)
RBC: 3.63 MIL/uL — ABNORMAL LOW (ref 4.22–5.81)
RDW: 16.6 % — ABNORMAL HIGH (ref 11.5–15.5)
WBC: 5.5 10*3/uL (ref 4.0–10.5)
nRBC: 0 % (ref 0.0–0.2)

## 2022-10-09 NOTE — ED Triage Notes (Signed)
Pt endorses hematuria x 3 days; denies pain, denies fevers; denies hx of same; dialysis pt, last dialyzed today, received full treatment

## 2022-10-10 ENCOUNTER — Emergency Department (HOSPITAL_COMMUNITY): Payer: 59

## 2022-10-10 MED ORDER — CEPHALEXIN 250 MG PO CAPS: 250.0000 mg | ORAL_CAPSULE | Freq: Every day | ORAL | 0 refills | Status: AC

## 2022-10-10 NOTE — Discharge Instructions (Signed)
You are seen in the ER today for your blood in your urine.  You opted to be discharged without proceeding with  imaging of your abdomen.  You may have a urinary tract infection.  Please take the prescribed antibiotic for the next week.  You may follow-up with the kidney doctors listed below.  Return to the ER with any new severe symptoms.

## 2022-10-10 NOTE — ED Provider Notes (Signed)
[place] EMERGENCY DEPARTMENT AT [facility] Provider Note   CSN: [id number] Arrival date & time: 10/09/22  1715     History  Chief Complaint  Patient presents with   Hematuria    Matthew [name] is a 72 y.o. male with hx of ESRD on HD T/Th/Sat (completed session today) who presents with concern for 3 days of painless pink-tinged urine.+ urinary frequency, reports urinating 25 times daily. No dysuria. States he passes urine every time he attempts to urinate. NO change in bowel habits. No Fevers/chills, n[place]asea or vomiting. No hx of same per patient.   I have reviewed his medical records, Hx of diabetes, HTN,  HPI     Home Medications Prior to Admission medications   Medication Sig Start Date End Date Taking? Authorizing Provider  cephALEXin (KEFLEX) 250 MG capsule Take 1 capsule (250 mg total) by mouth daily for 7 days. 10/10/22 10/17/22 Yes Lequisha Cammack, Eugene Gavia, PA-C  Acetaminophen 325 MG CAPS Take 325 mg by mouth every 6 (six) hours as needed for moderate pain or mild pain.    [provider]  aspirin 325 MG tablet Take 325 mg by mouth as needed for mild pain.    [provider]  B Complex-C-Zn-Folic Acid (DIALYVITE/ZINC) TABS Take 1 tablet by mouth daily. 07/24/20   [provider]  lidocaine-prilocaine (EMLA) cream Apply 1 Application topically Every Tuesday,Thursday,and Saturday with dialysis. 01/25/19   [provider]  oxyCODONE-acetaminophen (PERCOCET) 5-325 MG tablet Take 1 tablet by mouth every 6 (six) hours as needed for severe pain. 10/21/21   Rhyne, Ames Coupe, PA-C  RENVELA 800 MG tablet Take 800 mg by mouth daily. 03/29/19   [provider]  VELPHORO 500 MG chewable tablet Chew 500-1,000 mg by mouth 3 (three) times daily with meals. 10/26/18   [provider]      Allergies    Patient has no known allergies.    Review of Systems   Review of Systems  Constitutional: Negative.   HENT: Negative.     Respiratory: Negative.    Cardiovascular: Negative.   Gastrointestinal: Negative.   Genitourinary:  Positive for frequency and hematuria. Negative for decreased urine volume, difficulty urinating, dysuria, penile swelling, scrotal swelling, testicular pain and urgency.    Physical Exam Updated Vital Signs BP (!) 151/78   Pulse 68   Temp 98.3 F (36.8 C) (Oral)   Resp 18   SpO2 100%  Physical Exam Vitals and nursing note reviewed.  Constitutional:      Appearance: He is not ill-appearing or toxic-appearing.  HENT:     Head: Normocephalic and atraumatic.     Mouth/Throat:     Mouth: Mucous membranes are moist.     Pharynx: No oropharyngeal exudate or posterior oropharyngeal erythema.  Eyes:     General:        Right eye: No discharge.        Left eye: No discharge.     Extraocular Movements: Extraocular movements intact.     Conjunctiva/sclera: Conjunctivae normal.     Pupils: Pupils are equal, round, and reactive to light.  Cardiovascular:     Rate and Rhythm: Normal rate and regular rhythm.     Pulses: Normal pulses.     Heart sounds: Murmur heard.  Pulmonary:     Effort: Pulmonary effort is normal. No respiratory distress.     Breath sounds: Normal breath sounds. No wheezing or rales.  Abdominal:     General: Bowel sounds are  normal. There is no distension.     Palpations: Abdomen is soft.     Tenderness: There is abdominal tenderness in the suprapubic area. There is no right CVA tenderness, left CVA tenderness, guarding or rebound.  Musculoskeletal:        General: No deformity.     Cervical back: Neck supple.  Skin:    General: Skin is warm and dry.     Capillary Refill: Capillary refill takes less than 2 seconds.  Neurological:     General: No focal deficit present.     Mental Status: He is alert and oriented to person, place, and time. Mental status is at baseline.  Psychiatric:        Mood and Affect: Mood normal.     ED Results / Procedures / Treatments    Labs (all labs ordered are listed, but only abnormal results are displayed) Labs Reviewed  URINALYSIS, ROUTINE W REFLEX MICROSCOPIC - Abnormal; Notable for the following components:      Result Value   APPearance HAZY (*)    Glucose, UA 50 (*)    Hgb urine dipstick LARGE (*)    Bacteria, UA RARE (*)    All other components within normal limits  BASIC METABOLIC PANEL - Abnormal; Notable for the following components:   Potassium 2.8 (*)    Chloride 93 (*)    Creatinine, Ser 4.87 (*)    Calcium 7.7 (*)    GFR, Estimated 12 (*)    All other components within normal limits  CBC - Abnormal; Notable for the following components:   RBC 3.63 (*)    Hemoglobin 10.5 (*)    HCT 34.0 (*)    RDW 16.6 (*)    All other components within normal limits  URINE CULTURE    EKG None  Radiology No results found.  Procedures Procedures    Medications Ordered in ED Medications - No data to display  ED Course/ Medical Decision Making/ A&P                                Medical Decision Making 72 year old male who presents with hematuria.  Hypertensive on intake vitals noted as normal.  Troponin is normal, abdominal exam is benign.  Patient well-appearing.  DDx includes but limited to infectious etiology, malignancy, obstructive uropathy, coagulopathy, prostatitis, glomerulonephritis, interstitial nephritis pyelonephritis.  Amount and/or Complexity of Data Reviewed Labs: ordered.    Details: CBC is without leukocytosis, mild anemia with hemoglobin of 10.5 near patient's baseline.  Normal platelets.  BMP with hypokalemia of 2.8, creatinine of 4.8 near patient's baseline.  UA with large hemoglobin, greater than 50 RBCs, 21-50 WBCs, rare bacteria.  Urine culture pending.   Risk Prescription drug management.    Patient with painless hematuria.  Send suprapubic tenderness to palpation.  CT scan offered due to patient's vague abdominal complaints and for more thorough evaluation context  of patient is a poor historian.  Patient declined imaging, requested to be discharged at this time.  Given symptoms of frequency in context of hematuria will discharge with coverage for urinary tract infection.  Urine culture pending at this time.  Recommend close outpatient follow-up.  Strict return precautions were given. Clinical concern for emergent underlying etiology that would warrant further ED workup or inpatient management is exceedingly low.  Bucky  voiced understanding of his medical evaluation and treatment plan. Each of their questions answered to their expressed satisfaction.  Return precautions were given.  Patient is well-appearing, stable, and was discharged in good condition.  This chart was dictated using voice recognition software, Dragon. Despite the best efforts of this provider to proofread and correct errors, errors may still occur which can change documentation meaning.     Final Clinical Impression(s) / ED Diagnoses Final diagnoses:  Hematuria, unspecified type    Rx / DC Orders ED Discharge Orders          Ordered    cephALEXin (KEFLEX) 250 MG capsule  Daily        10/10/22 0059              Ebbie Cherry, Eugene Gavia, PA-C 10/10/22 0214    Tilden Fossa, MD 10/10/22 0400

## 2022-10-11 LAB — URINE CULTURE: Culture: NO GROWTH

## 2023-03-10 ENCOUNTER — Telehealth (HOSPITAL_COMMUNITY): Payer: Self-pay | Admitting: *Deleted

## 2023-03-10 NOTE — Telephone Encounter (Signed)
Received fax from Dr Bufford Buttner due to decreased access flow and prolonged bleeding when cannula is removed.  Will notify Marchelle Folks and scan fax into media.

## 2023-03-11 ENCOUNTER — Telehealth: Payer: Self-pay

## 2023-03-11 NOTE — Telephone Encounter (Signed)
Triage Call: -called pt to evaluate referral for decreased access flow and prolonged bleeding

## 2023-03-12 ENCOUNTER — Telehealth: Payer: Self-pay

## 2023-03-12 NOTE — Telephone Encounter (Signed)
Attempted to call for surgery scheduling. LVM

## 2023-03-16 ENCOUNTER — Telehealth: Payer: Self-pay

## 2023-03-16 NOTE — Telephone Encounter (Signed)
 Attempted to call for surgery scheduling. LVM

## 2023-03-23 ENCOUNTER — Telehealth: Payer: Self-pay

## 2023-03-23 NOTE — Telephone Encounter (Signed)
 Attempted to call for surgery scheduling. VM Full.

## 2023-03-31 ENCOUNTER — Telehealth: Payer: Self-pay

## 2023-03-31 NOTE — Telephone Encounter (Signed)
 Have left several messages for patient re: surgery scheduling for fistulogram with possible intervention.  No calls returned.  Patient has a scheduled AV Shunt Intervention with Dr. Glenna Fellows on 04/10/23. Referral request shredded.

## 2023-04-10 ENCOUNTER — Encounter (HOSPITAL_COMMUNITY)
Admission: RE | Disposition: A | Discharge status: Home / Self Care | Payer: Self-pay | Source: Ambulatory Visit | Attending: Internal Medicine

## 2023-04-10 ENCOUNTER — Other Ambulatory Visit: Payer: Self-pay

## 2023-04-10 ENCOUNTER — Ambulatory Visit (HOSPITAL_COMMUNITY)
Admission: RE | Admit: 2023-04-10 | Discharge: 2023-04-10 | Disposition: A | Discharge status: Home / Self Care | Payer: 59 | Source: Ambulatory Visit | Attending: Internal Medicine | Admitting: Internal Medicine

## 2023-04-10 DIAGNOSIS — I120 Hypertensive chronic kidney disease with stage 5 chronic kidney disease or end stage renal disease: Secondary | ICD-10-CM | POA: Insufficient documentation

## 2023-04-10 DIAGNOSIS — N186 End stage renal disease: Secondary | ICD-10-CM | POA: Insufficient documentation

## 2023-04-10 DIAGNOSIS — Y832 Surgical operation with anastomosis, bypass or graft as the cause of abnormal reaction of the patient, or of later complication, without mention of misadventure at the time of the procedure: Secondary | ICD-10-CM | POA: Diagnosis not present

## 2023-04-10 DIAGNOSIS — Z87891 Personal history of nicotine dependence: Secondary | ICD-10-CM | POA: Diagnosis not present

## 2023-04-10 DIAGNOSIS — Z992 Dependence on renal dialysis: Secondary | ICD-10-CM | POA: Diagnosis not present

## 2023-04-10 DIAGNOSIS — E1122 Type 2 diabetes mellitus with diabetic chronic kidney disease: Secondary | ICD-10-CM | POA: Diagnosis not present

## 2023-04-10 DIAGNOSIS — T82858A Stenosis of vascular prosthetic devices, implants and grafts, initial encounter: Secondary | ICD-10-CM | POA: Diagnosis present

## 2023-04-10 HISTORY — PX: A/V SHUNT INTERVENTION: CATH118220

## 2023-04-10 HISTORY — PX: VENOUS ANGIOPLASTY: CATH118376

## 2023-04-10 SURGERY — A/V SHUNT INTERVENTION
Anesthesia: LOCAL

## 2023-04-10 MED ORDER — MIDAZOLAM HCL 2 MG/2ML IJ SOLN
INTRAMUSCULAR | Status: DC | PRN
  Administered 2023-04-10: 1 mg via INTRAVENOUS

## 2023-04-10 MED ORDER — LIDOCAINE HCL (PF) 1 % IJ SOLN
INTRAMUSCULAR | Status: DC | PRN
  Administered 2023-04-10: 2 mL via SUBCUTANEOUS

## 2023-04-10 MED ORDER — SODIUM CHLORIDE 0.9 % IV SOLN: INTRAVENOUS | Status: DC

## 2023-04-10 MED ORDER — LIDOCAINE HCL (PF) 1 % IJ SOLN
INTRAMUSCULAR | Status: AC
  Filled 2023-04-10: qty 30

## 2023-04-10 MED ORDER — FENTANYL CITRATE (PF) 100 MCG/2ML IJ SOLN
INTRAMUSCULAR | Status: DC | PRN
  Administered 2023-04-10: 25 ug via INTRAVENOUS

## 2023-04-10 MED ORDER — HEPARIN (PORCINE) IN NACL 1000-0.9 UT/500ML-% IV SOLN
INTRAVENOUS | Status: DC | PRN
  Administered 2023-04-10: 500 mL

## 2023-04-10 MED ORDER — FENTANYL CITRATE (PF) 100 MCG/2ML IJ SOLN
INTRAMUSCULAR | Status: AC
  Filled 2023-04-10: qty 2

## 2023-04-10 MED ORDER — IODIXANOL 320 MG/ML IV SOLN
INTRAVENOUS | Status: DC | PRN
  Administered 2023-04-10: 12 mL via INTRAVENOUS

## 2023-04-10 MED ORDER — ONDANSETRON HCL 4 MG/2ML IJ SOLN: 4.0000 mg | Freq: Four times a day (QID) | INTRAMUSCULAR | Status: DC | PRN

## 2023-04-10 MED ORDER — ACETAMINOPHEN 325 MG PO TABS: 650.0000 mg | ORAL_TABLET | ORAL | Status: DC | PRN

## 2023-04-10 MED ORDER — MIDAZOLAM HCL 2 MG/2ML IJ SOLN
INTRAMUSCULAR | Status: AC
  Filled 2023-04-10: qty 2

## 2023-04-10 SURGICAL SUPPLY — 9 items
BALLN ATHLETIS 8X40X75 (BALLOONS) ×2 IMPLANT
BALLOON ATHLETIS 8X40X75 (BALLOONS) IMPLANT
COVER DOME SNAP 22 D (MISCELLANEOUS) ×2 IMPLANT
GUIDEWIRE ANGLED .035 180CM (WIRE) IMPLANT
KIT MICROPUNCTURE NIT STIFF (SHEATH) IMPLANT
SHEATH PINNACLE R/O II 6F 4CM (SHEATH) IMPLANT
SYR MEDALLION 10ML (SYRINGE) IMPLANT
SYR MEDALLION 3ML (SYRINGE) IMPLANT
TRAY PV CATH (CUSTOM PROCEDURE TRAY) ×2 IMPLANT

## 2023-04-10 NOTE — H&P (Signed)
 [facility]  Vascular Access Procedure H&P  Reason for Consultation: low AF and high VP Requesting Provider: Dr. Signe Colt  HPI: Matthew [name] is an 73 y.o. male with ESRD on HD, DM, HTN, OA who presents for evaluation of HD access issues.    Has LUE AVG placed in 2023 by Dr. Edilia Bo.  States prior intervention at [facility], records not immediately available.  Hasn't had prolonged bleeding or cannulation difficulties.    He's NPO, has a driver, no h/o contrast allergy, no anticoagulation.   PMH: Past Medical History:  Diagnosis Date   Arthritis    Diabetes (HCC)    pt stated that he is no longer being treated for DM   ESRD (end stage renal disease) (HCC) 04/26/2018   Essential hypertension    Pt statewd that he now has low BP   PSH: Past Surgical History:  Procedure Laterality Date   AV FISTULA PLACEMENT Left 10/18/2018   Procedure: First Stage Basilic Transposition.;  Surgeon: Chuck Hint, MD;  Location: [facility] OR;  Service: Vascular;  Laterality: Left;   AV FISTULA PLACEMENT Left 10/21/2021   Procedure: INSERTION OF LEFT ARM ARTERIOVENOUS (AV) GORE-TEX GRAFT;  Surgeon: Chuck Hint, MD;  Location: [facility] OR;  Service: Vascular;  Laterality: Left;   BASCILIC VEIN TRANSPOSITION Left 04/04/2019   Procedure: BASCILIC VEIN TRANSPOSITION;  Surgeon: Chuck Hint, MD;  Location: [facility] OR;  Service: Vascular;  Laterality: Left;   finger lanced     INSERTION OF DIALYSIS CATHETER Right 10/18/2018   Procedure: INSERTION OF Right Internal Jugular DIALYSIS CATHETER;  Surgeon: Chuck Hint, MD;  Location: [facility] OR;  Service: Vascular;  Laterality: Right;   Past Medical History:  Diagnosis Date   Arthritis    Diabetes (HCC)    pt stated that he is no longer being treated for DM   ESRD (end stage renal disease) (HCC) 04/26/2018   Essential hypertension    Pt statewd that he now has low BP    Medications:  I have reviewed the patient's current  medications.  Medications Prior to Admission  Medication Sig Dispense Refill   Acetaminophen 325 MG CAPS Take 325 mg by mouth every 6 (six) hours as needed for moderate pain or mild pain.     aspirin EC 81 MG tablet Take 81 mg by mouth daily.     rosuvastatin (CRESTOR) 10 MG tablet Take 10 mg by mouth daily.     VELPHORO 500 MG chewable tablet Chew 1,500-2,000 mg by mouth 3 (three) times daily with meals.      ALLERGIES:  No Known Allergies  FAM HX: Family History  Problem Relation Age of Onset   Hypertension Mother    Migraines Mother    Heart disease Mother     Social History:   reports that he quit smoking about 26 years ago. His smoking use included cigarettes. He started smoking about 27 years ago. He has a 0.3 pack-year smoking history. He has never used smokeless tobacco. He reports current alcohol use of about 10.0 - 12.0 standard drinks of alcohol per week. He reports that he does not currently use drugs after having used the following drugs: Marijuana.  ROS: 12 system ROS neg except per HPI above  Blood pressure 127/71, pulse 66, resp. rate 12, SpO2 97%. PHYSICAL EXAM: Gen: comfortable lying on stretcher  Eyes: anicteric, EOMI, glasses ENT: poor dentition, class 2/3 airway CV:  RRR Lungs: clear ant Extr: LUE AVG pulsatile, high pitched near axilla  Neuro: conversant, nonfocal    No results found for this or any previous visit (from the past 48 hours).  No results found.  Assessment/PlanJoseph [name] is an 73 y.o. male with ESRD on HD, DM, HTN, OA who presents for evaluation of HD access issues.    **ESRD with AV access dysfunction:  Pt with LUE AVG with evidence by history and on exam of outflow stenosis.  After reviewing the risks, benefits and alternatives agrees to proceed with diagnostic angiogram and likely therapeutic angioplasty as indicated.  Will use conscious sedation for his comfort with appropriate monitoring.   Tyler Pita 04/10/2023, 7:00  AM

## 2023-04-10 NOTE — Discharge Instructions (Signed)

## 2023-04-10 NOTE — Op Note (Signed)
 Patient presents with decreased access flows and  high venous pressures of his left upper arm straight graft placed  in 09/2021.  On exam the LUA straight AVG is hyperpulsatile.   Summary:  1) Successful angiogram of a right upper arm straight AVG   with evidence of a 80% axillary and 80% venous anastomosis to axillary vein stenosis treated to 10% with a 8 mm Athletis FE ~30 atm.   2) The body of the graft, central veins, and inflow were widely patent. 3) This right upper arm straight graft remains amenable to future percutaneous intervention. If lesion recurs in < 3months, would favor stent graft placement.   Description of procedure: The left  upper arm was prepped and draped in the usual fashion. The left  upper arm straight AVG was cannulated ([id number]) in the arterial limb of the graft in an antegrade direction with an 21G micropuncture needle and then a 6 Fr sheath was inserted by guidewire exchange technique. The angiogram revealed a patent body of the graft with a 80% venous anastomosis stenosis and another severe 80% axillary stenosis separate from the [place]. The body of the graft, central veins and inflow were patent.  A guidewire was easily advanced past the outflow stenosis and parked in the central veins. I then advanced an 8 x 4 Athletis angioplasty balloon through the antegrade sheath over the guidewire first to the level of the axillary and then venous anastomosis to axillary vein stenosis. Venous angioplasty was performed to 100% balloon effacement with approximately 30ATM of pressure via a hand syringe assembly.   Final arteriogram and completion venogram revealed no evidence of extravasation or dissection, more rapid access flows through the graft and 10% residual stenosis in the venous anastomosis.  Hemostasis: A 3-0 ethilon purse string suture was placed at the cannulation site on removal of the sheath.  Sedation: 1mg  Versed, Fentanyl.  Sedation time: 11 minutes  Contrast. 12  mL  Monitoring: Because of the patient's comorbid conditions and sedation during the procedure, continuous EKG monitoring and O2 saturation monitoring was performed throughout the procedure by the RN. There were no abnormal arrhythmias encountered.  Complications: None.   Diagnoses: I87.1 Stricture of vein  N18.6 ESRD T82.858A Stricture of access  Procedure Coding:  [phone] Cannulation and angiogram of fistula, venous angioplasty (AVG VA) U9811 Contrast  Recommendations:  1. Continue to cannulate the graft with 15G needles.  2. Refer back for problems with flows. 3. Remove the suture next treatment.   Discharge: The patient was discharged home in stable condition. The patient was given education regarding the care of the dialysis access AVG and specific instructions in case of any problems.

## 2023-04-13 ENCOUNTER — Encounter (HOSPITAL_COMMUNITY): Payer: Self-pay | Admitting: Internal Medicine

## 2023-09-14 ENCOUNTER — Other Ambulatory Visit: Payer: Self-pay | Admitting: Vascular Surgery

## 2023-09-14 ENCOUNTER — Other Ambulatory Visit: Payer: Self-pay

## 2023-09-14 ENCOUNTER — Encounter (HOSPITAL_COMMUNITY)
Admission: RE | Disposition: A | Discharge status: Home / Self Care | Payer: Self-pay | Source: Home / Self Care | Attending: Vascular Surgery

## 2023-09-14 ENCOUNTER — Ambulatory Visit (HOSPITAL_COMMUNITY)
Admission: RE | Admit: 2023-09-14 | Discharge: 2023-09-14 | Disposition: A | Discharge status: Home / Self Care | Attending: Vascular Surgery | Admitting: Vascular Surgery

## 2023-09-14 DIAGNOSIS — T82858A Stenosis of vascular prosthetic devices, implants and grafts, initial encounter: Secondary | ICD-10-CM | POA: Insufficient documentation

## 2023-09-14 DIAGNOSIS — Z992 Dependence on renal dialysis: Secondary | ICD-10-CM | POA: Diagnosis not present

## 2023-09-14 DIAGNOSIS — Y832 Surgical operation with anastomosis, bypass or graft as the cause of abnormal reaction of the patient, or of later complication, without mention of misadventure at the time of the procedure: Secondary | ICD-10-CM | POA: Insufficient documentation

## 2023-09-14 DIAGNOSIS — I120 Hypertensive chronic kidney disease with stage 5 chronic kidney disease or end stage renal disease: Secondary | ICD-10-CM | POA: Insufficient documentation

## 2023-09-14 DIAGNOSIS — N186 End stage renal disease: Secondary | ICD-10-CM | POA: Diagnosis not present

## 2023-09-14 HISTORY — PX: VENOUS STENT: CATH118377

## 2023-09-14 HISTORY — PX: A/V SHUNT INTERVENTION: CATH118220

## 2023-09-14 HISTORY — PX: VENOUS ANGIOPLASTY: CATH118376

## 2023-09-14 SURGERY — A/V SHUNT INTERVENTION
Anesthesia: LOCAL | Site: Arm Upper | Laterality: Left

## 2023-09-14 MED ORDER — LIDOCAINE HCL (PF) 1 % IJ SOLN
INTRAMUSCULAR | Status: DC | PRN
  Administered 2023-09-14: 2 mL via SUBCUTANEOUS

## 2023-09-14 MED ORDER — IODIXANOL 320 MG/ML IV SOLN
INTRAVENOUS | Status: DC | PRN
  Administered 2023-09-14: 35 mL via INTRAVENOUS

## 2023-09-14 MED ORDER — HEPARIN (PORCINE) IN NACL 1000-0.9 UT/500ML-% IV SOLN
INTRAVENOUS | Status: DC | PRN
  Administered 2023-09-14: 500 mL

## 2023-09-14 MED ORDER — LIDOCAINE HCL (PF) 1 % IJ SOLN
INTRAMUSCULAR | Status: AC
  Filled 2023-09-14: qty 30

## 2023-09-14 MED ORDER — CLOPIDOGREL BISULFATE 75 MG PO TABS: 75.0000 mg | ORAL_TABLET | Freq: Every day | ORAL | 1 refills | Status: AC

## 2023-09-14 SURGICAL SUPPLY — 11 items
BALLOON MUSTANG 8X60X75 (BALLOONS) IMPLANT
GUIDEWIRE ANGLED .035X150CM (WIRE) IMPLANT
KIT ENCORE 26 ADVANTAGE (KITS) IMPLANT
KIT MICROPUNCTURE NIT STIFF (SHEATH) IMPLANT
SHEATH PINNACLE 8F 10CM (SHEATH) IMPLANT
SHEATH PINNACLE R/O II 7F 4CM (SHEATH) IMPLANT
SHEATH PROBE COVER 6X72 (BAG) IMPLANT
STENT VIABAHN 8X100X75 (Permanent Stent) IMPLANT
STOPCOCK MORSE 400PSI 3WAY (MISCELLANEOUS) IMPLANT
TRAY PV CATH (CUSTOM PROCEDURE TRAY) ×2 IMPLANT
TUBING CIL FLEX 10 FLL-RA (TUBING) IMPLANT

## 2023-09-14 NOTE — H&P (Signed)
 [facility] OF [place]  ASSESSMENT / PLAN: 73 y.o. male with end-stage renal disease dialyzing through a left arm AV graft.  The graft is giving alarms at dialysis.  Fistulogram was offered today.  CHIEF COMPLAINT: End-stage renal disease  HISTORY OF PRESENT ILLNESS: Matthew [name] is a 73 y.o. male referred to the outpatient dialysis center for evaluation and management of the left arm AV graft.  The graft is malfunctioning at dialysis, giving alarms on the machine.  The patient has no other complaints today.  We reviewed the procedure in detail.  Past Medical History:  Diagnosis Date   Arthritis    Diabetes [facility])    pt stated that he is no longer being treated for DM   ESRD (end stage renal disease) (HCC) 04/26/2018   Essential hypertension    Pt statewd that he now has low BP    Past Surgical History:  Procedure Laterality Date   A/V SHUNT INTERVENTION N/A 04/10/2023   Procedure: A/V SHUNT INTERVENTION;  Surgeon: Norine Manuelita LABOR, MD;  [facility];  Service: Cardiovascular;  Laterality: N/A;   AV FISTULA PLACEMENT Left 10/18/2018   Procedure: First Stage Basilic Transposition.;  Surgeon: Eliza Lonni RAMAN, MD;  Location: [facility] OR;  Service: Vascular;  Laterality: Left;   AV FISTULA PLACEMENT Left 10/21/2021   Procedure: INSERTION OF LEFT ARM ARTERIOVENOUS (AV) GORE-TEX GRAFT;  Surgeon: Eliza Lonni RAMAN, MD;  Location: [facility] OR;  Service: Vascular;  Laterality: Left;   BASCILIC VEIN TRANSPOSITION Left 04/04/2019   Procedure: BASCILIC VEIN TRANSPOSITION;  Surgeon: Eliza Lonni RAMAN, MD;  Location: [facility] OR;  Service: Vascular;  Laterality: Left;   finger lanced     INSERTION OF DIALYSIS CATHETER Right 10/18/2018   Procedure: INSERTION OF Right Internal Jugular DIALYSIS CATHETER;  Surgeon: Eliza Lonni RAMAN, MD;  Location: [facility] OR;  Service: Vascular;  Laterality: Right;   VENOUS ANGIOPLASTY  04/10/2023   Procedure: VENOUS ANGIOPLASTY;   Surgeon: Norine Manuelita LABOR, MD;  Location[facility];  Service: Cardiovascular;;  Venous Anastomosis; Axillary Vein    Family History  Problem Relation Age of Onset   Hypertension Mother    Migraines Mother    Heart disease Mother     Social History   Socioeconomic History   Marital status: Married    Spouse name: Diane   Number of children: 3   Years of education: Not on file   Highest education level: Not on file  Occupational History   Occupation: dietician  Tobacco Use   Smoking status: Former    Current packs/day: 0.00    Average packs/day: 0.3 packs/day for 1 year (0.3 ttl pk-yrs)    Types: Cigarettes    Start date: 01/28/1996    Quit date: 01/27/1997    Years since quitting: 26.6   Smokeless tobacco: Never  Vaping Use   Vaping status: Never Used  Substance and Sexual Activity   Alcohol use: Yes    Alcohol/week: 10.0 - 12.0 standard drinks of alcohol    Types: 10 - 12 Cans of beer per week    Comment: occasional   Drug use: Not Currently    Types: Marijuana   Sexual activity: Yes  Other Topics Concern   Not on file  Social History Narrative   Not on file   Social Drivers of Health   Financial Resource Strain: Not on file  Food Insecurity: Not on file  Transportation Needs: Not on file  Physical Activity: Not  on file  Stress: Not on file  Social Connections: Not on file  Intimate Partner Violence: Not on file    No Known Allergies  No current facility-administered medications for this encounter.    PHYSICAL EXAM Vitals:   09/14/23 0910 09/14/23 1012  BP: (!) 173/80   Pulse: (!) 59   Resp: 14   SpO2: 100% 97%   Elderly man in no distress Regular rate and rhythm Unlabored breathing Left arm AV graft with pulsatile thrill  PERTINENT LABORATORY AND RADIOLOGIC DATA  Most recent CBC    Latest Ref Rng & Units 10/09/2022    5:25 PM 01/17/2022   11:30 PM 10/21/2021    6:02 AM  CBC  WBC 4.0 - 10.5 K/uL 5.5  4.2    Hemoglobin 13.0 - 17.0  g/dL 89.4  88.6  86.0   Hematocrit 39.0 - 52.0 % 34.0  33.9  41.0   Platelets 150 - 400 K/uL 290  218       Most recent CMP    Latest Ref Rng & Units 10/09/2022    5:25 PM 01/17/2022   11:30 PM 10/21/2021    6:02 AM  CMP  Glucose 70 - 99 mg/dL 84  895  88   BUN 8 - 23 mg/dL 12  895  60   Creatinine 0.61 - 1.24 mg/dL 5.12  81.68  88.99   Sodium 135 - 145 mmol/L 135  134  134   Potassium 3.5 - 5.1 mmol/L 2.8  5.0  4.3   Chloride 98 - 111 mmol/L 93  92  96   CO2 22 - 32 mmol/L 29  26    Calcium  8.9 - 10.3 mg/dL 7.7  7.9    Total Protein 6.5 - 8.1 g/dL  8.0    Total Bilirubin 0.3 - 1.2 mg/dL  0.5    Alkaline Phos 38 - 126 U/L  39    AST 15 - 41 U/L  18    ALT 0 - 44 U/L  12      Renal function CrCl cannot be calculated (Patient's most recent lab result is older than the maximum 21 days allowed.).  Hgb A1c MFr Bld (%)  Date Value  10/13/2018 <4.2 (L)   Debby SAILOR. Magda, MD FACS [facility] Phone Number: [phone] 09/14/2023 10:14 AM   Total time spent on preparing this encounter including chart review, data review, collecting history, examining the patient, and coordinating care: 30 minutes  Portions of this report may have been transcribed using voice recognition software.  Every effort has been made to ensure accuracy; however, inadvertent computerized transcription errors may still be present.

## 2023-09-14 NOTE — Op Note (Signed)
 DATE OF SERVICE: 09/14/2023  PATIENT:  Matthew [name]  73 y.o. male  PRE-OPERATIVE DIAGNOSIS:  end-stage renal disease; poorly functioning left arm AVG  POST-OPERATIVE DIAGNOSIS:  Same  PROCEDURE:   1) Ultrasound guided left arm AVG access (CPT [phone]) 2) Fistulagram with peripheral angioplasty and stenting (CPT [phone])  3) established outpatient evaluation and management - level 3 (CPT 99213)  SURGEON:  Debby SAILOR. Magda, MD  ASSISTANT: none  ANESTHESIA:   local  ESTIMATED BLOOD LOSS: minimal  LOCAL MEDICATIONS USED:  LIDOCAINE    COUNTS: confirmed correct.  PATIENT DISPOSITION:  PACU - hemodynamically stable.   Delay start of Pharmacological VTE agent (>24hrs) due to surgical blood loss or risk of bleeding: no  INDICATION FOR PROCEDURE: Matthew [name] is a 73 y.o. male with left arm AVG that is performing poorly at dialysis. After careful discussion of risks, benefits, and alternatives the patient was offered fistulagram. The patient understood and wished to proceed.  OPERATIVE FINDINGS:  Left Upper Extremity Central venous: no stenosis Subclavian vein: no stenosis Axillary vein: Severe stenosis (>90%) at venous anastomosis and native axillary vein Graft: severe stenosis (>90%) in central portion of graft. No other stenosis in graft. Arterial anastomosis: widely patent.   DESCRIPTION OF PROCEDURE: After identification of the patient in the pre-operative holding area, the patient was transferred to the operating room. The patient was positioned supine on the operating room table.  The left upper extremity was prepped and draped in standard fashion. A surgical pause was performed confirming correct patient, procedure, and operative location.  The left upper extremity was anesthetized with subcutaneous injection of 1% lidocaine  over the area of planned access. Using ultrasound guidance, the left arm dialysis access was accessed with micropuncture technique.  Fistulogram was  performed in stations with the micro sheath.  See above for details.  The decision was made to intervene. Angioplasty of the lesion was performed with a 8x54mm [place]ang. Extravasation of contrast was noted after angioplasty. The area was stented with a 8x168mm Viabahn covered stent. Good result was achieved on follow up angiogram. Thrill was much improved in graft after intervention.  All endovascular equipment was removed.  A figure-of-eight stitch was applied to the exit site with good hemostasis.  Sterile bandage was applied.  Upon completion of the case instrument and sharps counts were confirmed correct. The patient was transferred to the PACU in good condition. I was present for all portions of the procedure.  PLAN: ASA. Plavix  x 1 month. Continue statin. Follow up PRN. Graft remains amenable to percutaneous intervention.   Debby SAILOR. Magda, MD [facility] Phone Number: [phone] 09/14/2023 10:46 AM

## 2023-09-15 ENCOUNTER — Encounter (HOSPITAL_COMMUNITY): Payer: Self-pay | Admitting: Vascular Surgery

## 2023-11-30 ENCOUNTER — Encounter (HOSPITAL_COMMUNITY)
Admission: RE | Disposition: A | Discharge status: Home / Self Care | Payer: Self-pay | Source: Home / Self Care | Attending: Vascular Surgery

## 2023-11-30 ENCOUNTER — Ambulatory Visit (HOSPITAL_COMMUNITY)
Admission: RE | Admit: 2023-11-30 | Discharge: 2023-11-30 | Disposition: A | Discharge status: Home / Self Care | Attending: Vascular Surgery | Admitting: Vascular Surgery

## 2023-11-30 ENCOUNTER — Other Ambulatory Visit: Payer: Self-pay

## 2023-11-30 DIAGNOSIS — T829XXA Unspecified complication of cardiac and vascular prosthetic device, implant and graft, initial encounter: Secondary | ICD-10-CM | POA: Insufficient documentation

## 2023-11-30 DIAGNOSIS — N186 End stage renal disease: Secondary | ICD-10-CM | POA: Insufficient documentation

## 2023-11-30 DIAGNOSIS — Z992 Dependence on renal dialysis: Secondary | ICD-10-CM | POA: Insufficient documentation

## 2023-11-30 DIAGNOSIS — E1122 Type 2 diabetes mellitus with diabetic chronic kidney disease: Secondary | ICD-10-CM | POA: Insufficient documentation

## 2023-11-30 DIAGNOSIS — I120 Hypertensive chronic kidney disease with stage 5 chronic kidney disease or end stage renal disease: Secondary | ICD-10-CM | POA: Insufficient documentation

## 2023-11-30 DIAGNOSIS — Y832 Surgical operation with anastomosis, bypass or graft as the cause of abnormal reaction of the patient, or of later complication, without mention of misadventure at the time of the procedure: Secondary | ICD-10-CM | POA: Diagnosis not present

## 2023-11-30 DIAGNOSIS — Z87891 Personal history of nicotine dependence: Secondary | ICD-10-CM | POA: Insufficient documentation

## 2023-11-30 DIAGNOSIS — N184 Chronic kidney disease, stage 4 (severe): Secondary | ICD-10-CM

## 2023-11-30 HISTORY — PX: A/V FISTULAGRAM: CATH118298

## 2023-11-30 SURGERY — A/V FISTULAGRAM
Anesthesia: LOCAL | Site: Arm Upper | Laterality: Left

## 2023-11-30 MED ORDER — LIDOCAINE HCL (PF) 1 % IJ SOLN
INTRAMUSCULAR | Status: DC | PRN
Start: 2023-11-30 — End: 2023-11-30
  Administered 2023-11-30: 2 mL via SUBCUTANEOUS

## 2023-11-30 MED ORDER — HEPARIN (PORCINE) IN NACL 1000-0.9 UT/500ML-% IV SOLN
INTRAVENOUS | Status: DC | PRN
  Administered 2023-11-30: 500 mL

## 2023-11-30 MED ORDER — IODIXANOL 320 MG/ML IV SOLN
INTRAVENOUS | Status: DC | PRN
  Administered 2023-11-30: 10 mL via INTRAVENOUS

## 2023-11-30 MED ORDER — LIDOCAINE HCL (PF) 1 % IJ SOLN
INTRAMUSCULAR | Status: AC
  Filled 2023-11-30: qty 30

## 2023-11-30 SURGICAL SUPPLY — 5 items
KIT MICROPUNCTURE NIT STIFF (SHEATH) IMPLANT
SHEATH PROBE COVER 6X72 (BAG) IMPLANT
STOPCOCK MORSE 400PSI 3WAY (MISCELLANEOUS) IMPLANT
TRAY PV CATH (CUSTOM PROCEDURE TRAY) ×1 IMPLANT
TUBING CIL FLEX 10 FLL-RA (TUBING) IMPLANT

## 2023-11-30 NOTE — H&P (Signed)
 [facility] OF [place]  ASSESSMENT / PLAN: 73 y.o. male with end-stage renal disease dialyzing through a left arm AV graft.  The graft is performing poorly at dialysis.  Fistulogram was offered today.  CHIEF COMPLAINT: End-stage renal disease  HISTORY OF PRESENT ILLNESS: Matthew [name] is a 73 y.o. male referred to the outpatient dialysis center for evaluation and management of the left arm AV graft.  The graft is malfunctioning at dialysis, giving alarms on the machine.  The patient has no other complaints today.  We reviewed the procedure in detail.  11/30/23: patient previously underwent outflow stenting with good angiographic result. He reports the graft is pulling clots at dialysis. Plan fistulagram to evaluate.  Past Medical History:  Diagnosis Date   Arthritis    Diabetes [facility])    pt stated that he is no longer being treated for DM   ESRD (end stage renal disease) (HCC) 04/26/2018   Essential hypertension    Pt statewd that he now has low BP    Past Surgical History:  Procedure Laterality Date   A/V SHUNT INTERVENTION N/A 04/10/2023   Procedure: A/V SHUNT INTERVENTION;  Surgeon: Norine Manuelita LABOR, MD;  [facility];  Service: Cardiovascular;  Laterality: N/A;   A/V SHUNT INTERVENTION Left 09/14/2023   Procedure: A/V SHUNT INTERVENTION;  Surgeon: Magda Debby SAILOR, MD;  Location: [facility];  Service: Cardiovascular;  Laterality: Left;   AV FISTULA PLACEMENT Left 10/18/2018   Procedure: First Stage Basilic Transposition.;  Surgeon: Eliza Lonni RAMAN, MD;  Location: [facility] OR;  Service: Vascular;  Laterality: Left;   AV FISTULA PLACEMENT Left 10/21/2021   Procedure: INSERTION OF LEFT ARM ARTERIOVENOUS (AV) GORE-TEX GRAFT;  Surgeon: Eliza Lonni RAMAN, MD;  Location: [facility] OR;  Service: Vascular;  Laterality: Left;   BASCILIC VEIN TRANSPOSITION Left 04/04/2019   Procedure: BASCILIC VEIN TRANSPOSITION;  Surgeon: Eliza Lonni RAMAN, MD;   Location: [facility] OR;  Service: Vascular;  Laterality: Left;   finger lanced     INSERTION OF DIALYSIS CATHETER Right 10/18/2018   Procedure: INSERTION OF Right Internal Jugular DIALYSIS CATHETER;  Surgeon: Eliza Lonni RAMAN, MD;  Location: [facility] OR;  Service: Vascular;  Laterality: Right;   VENOUS ANGIOPLASTY  04/10/2023   Procedure: VENOUS ANGIOPLASTY;  Surgeon: Norine Manuelita LABOR, MD;  [facility];  Service: Cardiovascular;;  Venous Anastomosis; Axillary Vein   VENOUS ANGIOPLASTY  09/14/2023   Procedure: VENOUS ANGIOPLASTY;  Surgeon: Magda Debby SAILOR, MD;  Location: [facility];  Service: Cardiovascular;;  Basilic Vein   VENOUS STENT  1/81/7974   Procedure: VENOUS STENT;  Surgeon: Magda Debby SAILOR, MD;  Location: [facility];  Service: Cardiovascular;;    Family History  Problem Relation Age of Onset   Hypertension Mother    Migraines Mother    Heart disease Mother     Social History   Socioeconomic History   Marital status: Married    Spouse name: Diane   Number of children: 3   Years of education: Not on file   Highest education level: Not on file  Occupational History   Occupation: dietician  Tobacco Use   Smoking status: Former    Current packs/day: 0.00    Average packs/day: 0.3 packs/day for 1 year (0.3 ttl pk-yrs)    Types: Cigarettes    Start date: 01/28/1996    Quit date: 01/27/1997    Years since quitting: 26.8   Smokeless tobacco: Never  Vaping Use  Vaping status: Never Used  Substance and Sexual Activity   Alcohol use: Yes    Alcohol/week: 10.0 - 12.0 standard drinks of alcohol    Types: 10 - 12 Cans of beer per week    Comment: occasional   Drug use: Not Currently    Types: Marijuana   Sexual activity: Yes  Other Topics Concern   Not on file  Social History Narrative   Not on file   Social Drivers of Health   Financial Resource Strain: Not on file  Food Insecurity: Not on file  Transportation Needs: Not on file  Physical Activity: Not on  file  Stress: Not on file  Social Connections: Not on file  Intimate Partner Violence: Not on file    No Known Allergies  No current facility-administered medications for this encounter.    PHYSICAL EXAM Vitals:   11/30/23 0709  BP: (!) 143/74  Pulse: 68  Resp: 16  SpO2: 99%   Elderly man in no distress Regular rate and rhythm Unlabored breathing Left arm AV graft with smooth, but weak thrill  PERTINENT LABORATORY AND RADIOLOGIC DATA  Most recent CBC    Latest Ref Rng & Units 10/09/2022    5:25 PM 01/17/2022   11:30 PM 10/21/2021    6:02 AM  CBC  WBC 4.0 - 10.5 K/uL 5.5  4.2    Hemoglobin 13.0 - 17.0 g/dL 89.4  88.6  86.0   Hematocrit 39.0 - 52.0 % 34.0  33.9  41.0   Platelets 150 - 400 K/uL 290  218       Most recent CMP    Latest Ref Rng & Units 10/09/2022    5:25 PM 01/17/2022   11:30 PM 10/21/2021    6:02 AM  CMP  Glucose 70 - 99 mg/dL 84  895  88   BUN 8 - 23 mg/dL 12  895  60   Creatinine 0.61 - 1.24 mg/dL 5.12  81.68  88.99   Sodium 135 - 145 mmol/L 135  134  134   Potassium 3.5 - 5.1 mmol/L 2.8  5.0  4.3   Chloride 98 - 111 mmol/L 93  92  96   CO2 22 - 32 mmol/L 29  26    Calcium  8.9 - 10.3 mg/dL 7.7  7.9    Total Protein 6.5 - 8.1 g/dL  8.0    Total Bilirubin 0.3 - 1.2 mg/dL  0.5    Alkaline Phos 38 - 126 U/L  39    AST 15 - 41 U/L  18    ALT 0 - 44 U/L  12      Renal function CrCl cannot be calculated (Patient's most recent lab result is older than the maximum 21 days allowed.).  Hgb A1c MFr Bld (%)  Date Value  10/13/2018 <4.2 (L)   Debby SAILOR. Magda, MD FACS [facility] Phone Number: [phone] 11/30/2023 7:43 AM   Total time spent on preparing this encounter including chart review, data review, collecting history, examining the patient, and coordinating care: 30 minutes  Portions of this report may have been transcribed using voice recognition software.  Every effort has been made to ensure  accuracy; however, inadvertent computerized transcription errors may still be present.

## 2023-11-30 NOTE — Op Note (Signed)
 DATE OF SERVICE: 11/30/2023  PATIENT:  Matthew [name]  73 y.o. male  PRE-OPERATIVE DIAGNOSIS:  end-stage renal disease  POST-OPERATIVE DIAGNOSIS:  Same  PROCEDURE:   1) Ultrasound guided left arm AVG access (CPT [phone]) 2) Left arm fistulagram (CPT [phone]) 3) established outpatient evaluation and management - level 3 (CPT 99213)  SURGEON:  Debby SAILOR. Magda, MD  ASSISTANT: none  ANESTHESIA:   local  ESTIMATED BLOOD LOSS: min  LOCAL MEDICATIONS USED:  LIDOCAINE    COUNTS: confirmed correct.  PATIENT DISPOSITION:  PACU - hemodynamically stable.   Delay start of Pharmacological VTE agent (>24hrs) due to surgical blood loss or risk of bleeding: no  INDICATION FOR PROCEDURE: Matthew [name] is a 73 y.o. male with ESRD with dysfunction of AVG at dialysis. After careful discussion of risks, benefits, and alternatives the patient was offered fistulagram. The patient understood and wished to proceed.  OPERATIVE FINDINGS:  Left upper Extremity Central venous: no stenosis Subclavian vein: no stenosis Axillary vein: no stenosis Graft: no stenosis; prior outflow stenting widely patent Anastomosis: no stenosis  DESCRIPTION OF PROCEDURE: After identification of the patient in the pre-operative holding area, the patient was transferred to the operating room. The patient was positioned supine on the operating room table.  The left upper extremity was prepped and draped in standard fashion. A surgical pause was performed confirming correct patient, procedure, and operative location.  The left upper extremity was anesthetized with subcutaneous injection of 1% lidocaine  over the area of planned access. Using ultrasound guidance, the left upper extremity dialysis access was accessed with micropuncture technique.  Fistulogram was performed in stations with the micro sheath.  See above for details.  All endovascular equipment was removed.  A figure-of-eight stitch was applied to the exit site with  good hemostasis.  Sterile bandage was applied.  Upon completion of the case instrument and sharps counts were confirmed correct. The patient was transferred to the PACU in good condition. I was present for all portions of the procedure.  PLAN: No technical problems with graft or stenting. OK to use graft. Graft remains amenable to percutaneous intervention.  Debby SAILOR. Magda, MD [facility] of [facility] Phone Number: [phone] 11/30/2023 8:43 AM

## 2023-12-01 ENCOUNTER — Encounter (HOSPITAL_COMMUNITY): Payer: Self-pay | Admitting: Vascular Surgery
